# Patient Record
Sex: Male | Born: 1955 | Race: White | Hispanic: No | Smoking: Current every day smoker
Health system: Southern US, Community
[De-identification: ages and names within clinical notes are randomized; demographics above are authoritative.]

## PROBLEM LIST (undated history)

## (undated) DIAGNOSIS — G40909 Epilepsy, unspecified, not intractable, without status epilepticus: Secondary | ICD-10-CM

## (undated) DIAGNOSIS — L509 Urticaria, unspecified: Secondary | ICD-10-CM

## (undated) DIAGNOSIS — F32A Depression, unspecified: Secondary | ICD-10-CM

## (undated) DIAGNOSIS — I10 Essential (primary) hypertension: Secondary | ICD-10-CM

## (undated) DIAGNOSIS — K219 Gastro-esophageal reflux disease without esophagitis: Secondary | ICD-10-CM

## (undated) DIAGNOSIS — J45909 Unspecified asthma, uncomplicated: Secondary | ICD-10-CM

## (undated) DIAGNOSIS — F329 Major depressive disorder, single episode, unspecified: Secondary | ICD-10-CM

## (undated) HISTORY — DX: Essential (primary) hypertension: I10

## (undated) HISTORY — PX: TONSILLECTOMY: SUR1361

## (undated) HISTORY — DX: Epilepsy, unspecified, not intractable, without status epilepticus: G40.909

## (undated) HISTORY — PX: HERNIA REPAIR: SHX51

## (undated) HISTORY — DX: Major depressive disorder, single episode, unspecified: F32.9

## (undated) HISTORY — PX: EYE SURGERY: SHX253

## (undated) HISTORY — DX: Depression, unspecified: F32.A

## (undated) HISTORY — PX: KNEE SURGERY: SHX244

## (undated) HISTORY — PX: CYST REMOVAL NECK: SHX6281

## (undated) HISTORY — DX: Gastro-esophageal reflux disease without esophagitis: K21.9

## (undated) HISTORY — DX: Unspecified asthma, uncomplicated: J45.909

## (undated) HISTORY — DX: Urticaria, unspecified: L50.9

## (undated) HISTORY — PX: CYST REMOVAL TRUNK: SHX6283

---

## 2012-10-04 DIAGNOSIS — R569 Unspecified convulsions: Secondary | ICD-10-CM | POA: Insufficient documentation

## 2012-10-04 DIAGNOSIS — E782 Mixed hyperlipidemia: Secondary | ICD-10-CM | POA: Insufficient documentation

## 2012-10-04 DIAGNOSIS — IMO0001 Reserved for inherently not codable concepts without codable children: Secondary | ICD-10-CM | POA: Insufficient documentation

## 2012-10-04 DIAGNOSIS — M810 Age-related osteoporosis without current pathological fracture: Secondary | ICD-10-CM | POA: Insufficient documentation

## 2012-10-04 DIAGNOSIS — J45909 Unspecified asthma, uncomplicated: Secondary | ICD-10-CM | POA: Insufficient documentation

## 2012-10-04 DIAGNOSIS — M1990 Unspecified osteoarthritis, unspecified site: Secondary | ICD-10-CM | POA: Insufficient documentation

## 2014-08-20 DIAGNOSIS — I10 Essential (primary) hypertension: Secondary | ICD-10-CM | POA: Insufficient documentation

## 2014-08-20 DIAGNOSIS — F17210 Nicotine dependence, cigarettes, uncomplicated: Secondary | ICD-10-CM | POA: Insufficient documentation

## 2016-02-10 DIAGNOSIS — Z6831 Body mass index (BMI) 31.0-31.9, adult: Secondary | ICD-10-CM | POA: Diagnosis not present

## 2016-02-10 DIAGNOSIS — E669 Obesity, unspecified: Secondary | ICD-10-CM | POA: Diagnosis not present

## 2016-02-10 DIAGNOSIS — Z1389 Encounter for screening for other disorder: Secondary | ICD-10-CM | POA: Diagnosis not present

## 2016-02-10 DIAGNOSIS — Z0000 Encounter for general adult medical examination without abnormal findings: Secondary | ICD-10-CM | POA: Diagnosis not present

## 2016-02-11 DIAGNOSIS — E785 Hyperlipidemia, unspecified: Secondary | ICD-10-CM | POA: Diagnosis not present

## 2016-02-11 DIAGNOSIS — R52 Pain, unspecified: Secondary | ICD-10-CM | POA: Diagnosis not present

## 2016-02-11 DIAGNOSIS — Z79899 Other long term (current) drug therapy: Secondary | ICD-10-CM | POA: Diagnosis not present

## 2016-02-11 DIAGNOSIS — I10 Essential (primary) hypertension: Secondary | ICD-10-CM | POA: Diagnosis not present

## 2016-02-11 DIAGNOSIS — G4700 Insomnia, unspecified: Secondary | ICD-10-CM | POA: Diagnosis not present

## 2016-02-11 DIAGNOSIS — F419 Anxiety disorder, unspecified: Secondary | ICD-10-CM | POA: Diagnosis not present

## 2016-02-18 DIAGNOSIS — Z1389 Encounter for screening for other disorder: Secondary | ICD-10-CM | POA: Diagnosis not present

## 2016-02-18 DIAGNOSIS — J019 Acute sinusitis, unspecified: Secondary | ICD-10-CM | POA: Diagnosis not present

## 2016-02-18 DIAGNOSIS — E669 Obesity, unspecified: Secondary | ICD-10-CM | POA: Diagnosis not present

## 2016-02-18 DIAGNOSIS — Z6831 Body mass index (BMI) 31.0-31.9, adult: Secondary | ICD-10-CM | POA: Diagnosis not present

## 2016-02-23 DIAGNOSIS — E875 Hyperkalemia: Secondary | ICD-10-CM | POA: Diagnosis not present

## 2016-02-25 DIAGNOSIS — F419 Anxiety disorder, unspecified: Secondary | ICD-10-CM | POA: Diagnosis not present

## 2016-02-25 DIAGNOSIS — R52 Pain, unspecified: Secondary | ICD-10-CM | POA: Diagnosis not present

## 2016-02-25 DIAGNOSIS — I10 Essential (primary) hypertension: Secondary | ICD-10-CM | POA: Diagnosis not present

## 2016-02-25 DIAGNOSIS — G4700 Insomnia, unspecified: Secondary | ICD-10-CM | POA: Diagnosis not present

## 2016-02-25 DIAGNOSIS — E785 Hyperlipidemia, unspecified: Secondary | ICD-10-CM | POA: Diagnosis not present

## 2016-02-25 DIAGNOSIS — Z79899 Other long term (current) drug therapy: Secondary | ICD-10-CM | POA: Diagnosis not present

## 2016-03-01 DIAGNOSIS — Z6831 Body mass index (BMI) 31.0-31.9, adult: Secondary | ICD-10-CM | POA: Diagnosis not present

## 2016-03-01 DIAGNOSIS — E669 Obesity, unspecified: Secondary | ICD-10-CM | POA: Diagnosis not present

## 2016-03-01 DIAGNOSIS — L509 Urticaria, unspecified: Secondary | ICD-10-CM | POA: Diagnosis not present

## 2016-03-02 DIAGNOSIS — T7840XA Allergy, unspecified, initial encounter: Secondary | ICD-10-CM | POA: Diagnosis not present

## 2016-03-02 DIAGNOSIS — R21 Rash and other nonspecific skin eruption: Secondary | ICD-10-CM | POA: Diagnosis not present

## 2016-03-02 DIAGNOSIS — L500 Allergic urticaria: Secondary | ICD-10-CM | POA: Diagnosis not present

## 2016-03-08 DIAGNOSIS — L509 Urticaria, unspecified: Secondary | ICD-10-CM | POA: Diagnosis not present

## 2016-03-09 DIAGNOSIS — R0602 Shortness of breath: Secondary | ICD-10-CM | POA: Diagnosis not present

## 2016-03-09 DIAGNOSIS — L509 Urticaria, unspecified: Secondary | ICD-10-CM | POA: Diagnosis not present

## 2016-03-09 DIAGNOSIS — G40909 Epilepsy, unspecified, not intractable, without status epilepticus: Secondary | ICD-10-CM | POA: Diagnosis not present

## 2016-03-09 DIAGNOSIS — T7840XA Allergy, unspecified, initial encounter: Secondary | ICD-10-CM | POA: Diagnosis not present

## 2016-03-09 DIAGNOSIS — J449 Chronic obstructive pulmonary disease, unspecified: Secondary | ICD-10-CM | POA: Diagnosis not present

## 2016-03-09 DIAGNOSIS — Z7982 Long term (current) use of aspirin: Secondary | ICD-10-CM | POA: Diagnosis not present

## 2016-03-09 DIAGNOSIS — Z79899 Other long term (current) drug therapy: Secondary | ICD-10-CM | POA: Diagnosis not present

## 2016-03-09 DIAGNOSIS — I10 Essential (primary) hypertension: Secondary | ICD-10-CM | POA: Diagnosis not present

## 2016-03-09 DIAGNOSIS — Z87891 Personal history of nicotine dependence: Secondary | ICD-10-CM | POA: Diagnosis not present

## 2016-03-10 DIAGNOSIS — G4700 Insomnia, unspecified: Secondary | ICD-10-CM | POA: Diagnosis not present

## 2016-03-10 DIAGNOSIS — Z79899 Other long term (current) drug therapy: Secondary | ICD-10-CM | POA: Diagnosis not present

## 2016-03-10 DIAGNOSIS — R52 Pain, unspecified: Secondary | ICD-10-CM | POA: Diagnosis not present

## 2016-03-10 DIAGNOSIS — E785 Hyperlipidemia, unspecified: Secondary | ICD-10-CM | POA: Diagnosis not present

## 2016-03-10 DIAGNOSIS — F419 Anxiety disorder, unspecified: Secondary | ICD-10-CM | POA: Diagnosis not present

## 2016-03-10 DIAGNOSIS — I10 Essential (primary) hypertension: Secondary | ICD-10-CM | POA: Diagnosis not present

## 2016-03-14 DIAGNOSIS — Z6830 Body mass index (BMI) 30.0-30.9, adult: Secondary | ICD-10-CM | POA: Diagnosis not present

## 2016-03-14 DIAGNOSIS — E871 Hypo-osmolality and hyponatremia: Secondary | ICD-10-CM | POA: Diagnosis not present

## 2016-03-14 DIAGNOSIS — Z09 Encounter for follow-up examination after completed treatment for conditions other than malignant neoplasm: Secondary | ICD-10-CM | POA: Diagnosis not present

## 2016-03-14 DIAGNOSIS — L509 Urticaria, unspecified: Secondary | ICD-10-CM | POA: Diagnosis not present

## 2016-03-14 DIAGNOSIS — E669 Obesity, unspecified: Secondary | ICD-10-CM | POA: Diagnosis not present

## 2016-03-15 DIAGNOSIS — Z79899 Other long term (current) drug therapy: Secondary | ICD-10-CM | POA: Diagnosis not present

## 2016-03-15 DIAGNOSIS — E785 Hyperlipidemia, unspecified: Secondary | ICD-10-CM | POA: Diagnosis not present

## 2016-03-15 DIAGNOSIS — G4733 Obstructive sleep apnea (adult) (pediatric): Secondary | ICD-10-CM | POA: Diagnosis not present

## 2016-03-15 DIAGNOSIS — I10 Essential (primary) hypertension: Secondary | ICD-10-CM | POA: Diagnosis not present

## 2016-03-16 DIAGNOSIS — E875 Hyperkalemia: Secondary | ICD-10-CM | POA: Diagnosis not present

## 2016-03-21 DIAGNOSIS — Z79891 Long term (current) use of opiate analgesic: Secondary | ICD-10-CM | POA: Diagnosis not present

## 2016-03-21 DIAGNOSIS — G894 Chronic pain syndrome: Secondary | ICD-10-CM | POA: Diagnosis not present

## 2016-03-21 DIAGNOSIS — F1120 Opioid dependence, uncomplicated: Secondary | ICD-10-CM | POA: Diagnosis not present

## 2016-03-21 DIAGNOSIS — M5136 Other intervertebral disc degeneration, lumbar region: Secondary | ICD-10-CM | POA: Diagnosis not present

## 2016-03-23 DIAGNOSIS — G4700 Insomnia, unspecified: Secondary | ICD-10-CM | POA: Diagnosis not present

## 2016-03-23 DIAGNOSIS — Z79899 Other long term (current) drug therapy: Secondary | ICD-10-CM | POA: Diagnosis not present

## 2016-03-23 DIAGNOSIS — I10 Essential (primary) hypertension: Secondary | ICD-10-CM | POA: Diagnosis not present

## 2016-03-23 DIAGNOSIS — E785 Hyperlipidemia, unspecified: Secondary | ICD-10-CM | POA: Diagnosis not present

## 2016-03-23 DIAGNOSIS — F419 Anxiety disorder, unspecified: Secondary | ICD-10-CM | POA: Diagnosis not present

## 2016-03-23 DIAGNOSIS — R52 Pain, unspecified: Secondary | ICD-10-CM | POA: Diagnosis not present

## 2016-03-24 ENCOUNTER — Ambulatory Visit (INDEPENDENT_AMBULATORY_CARE_PROVIDER_SITE_OTHER): Payer: Medicare HMO | Admitting: Allergy and Immunology

## 2016-03-24 ENCOUNTER — Encounter: Payer: Self-pay | Admitting: Allergy and Immunology

## 2016-03-24 VITALS — BP 136/78 | HR 88 | Resp 14 | Ht 66.38 in | Wt 203.0 lb

## 2016-03-24 DIAGNOSIS — L500 Allergic urticaria: Secondary | ICD-10-CM

## 2016-03-24 DIAGNOSIS — J449 Chronic obstructive pulmonary disease, unspecified: Secondary | ICD-10-CM

## 2016-03-24 DIAGNOSIS — J3089 Other allergic rhinitis: Secondary | ICD-10-CM

## 2016-03-24 MED ORDER — FLUTICASONE-UMECLIDIN-VILANT 100-62.5-25 MCG/INH IN AEPB: 1.0000 | INHALATION_SPRAY | Freq: Every day | RESPIRATORY_TRACT | 5 refills | Status: DC

## 2016-03-24 MED ORDER — CETIRIZINE HCL 10 MG PO TABS: ORAL_TABLET | ORAL | 5 refills | Status: DC

## 2016-03-24 NOTE — Patient Instructions (Addendum)
  1. Allergen avoidance measures?  2. Every day use cetirizine 10 mg tablet 1 time per day. Can increase to twice a day if needed  3. Change Breo to Trelegy one inhalation 1 time per day  4. Continue montelukast 10 mg daily  5. Continue Flonase one-2 sprays each nostril daily  6. Continue Ventolin HFA if needed  7. Review results of all previous blood testing  8. Further evaluation and treatment?  9. Return to clinic in 4 weeks or earlier if problem

## 2016-03-24 NOTE — Progress Notes (Signed)
Dear Adrian Bird,  Thank you for referring Adrian Bird to the [facility] on 03/24/2016.   Below is a summation of this patient's evaluation and recommendations.  Thank you for your referral. I will keep you informed about this patient's response to treatment.   If you have any questions please do not hesitate to contact me.   Sincerely,  Adrian Priest, MD Allergy / Immunology  [facility]   ______________________________________________________________________    NEW PATIENT NOTE  Referring Provider: [name], PA Primary Provider: [name], PA Date of office visit: 03/24/2016    Subjective:   Chief Complaint:  Adrian Bird (DOB: Aug 15, 1955) is a 61 y.o. male who presents to the clinic on 03/24/2016 with a chief complaint of Urticaria .     HPI: Adrian Bird presents to this clinic in evaluation of a reaction that occurred approximately 3 weeks ago.  Apparently Adrian Bird developed red raised intensely itchy "water blisters" across his body approximately 3 weeks ago. He apparently was evaluated by his primary care doctor but his reaction progressed over 3 days and he ended up being admitted to [facility] for 1-1/2 days and treated with multiple medications with resolution of this issue within a week or so. However, last night he did have an episode involving his left arm that was red and itchy but without "water blisters" and this appeared to resolve over the course of the subsequent 12 hours.  He has no associated systemic or constitutional symptoms with this reaction. His lesions never healed with scar or hyperpigmentation. There is no obvious trigger giving rise to this issue. He has not had any new medications or new environmental change and has not apparently had an infectious disease of any type. He did use over-the-counter Allegra for 4 days prior  to this reaction because his prescription for Allegra ran out. He did have blood tests checked at [facility] and at [facility] although the results of those blood tests are not available for review at this point in time.  Adrian Bird does have a history of respiratory tract inflammation. He apparently has a history of COPD having smoked from the age of 38 up until February 2017. He now uses a Teaching laboratory technician. He's been placed on Breo which does help him regarding his wheezing and coughing and shortness of breath. He will use a short acting bronchodilator once or twice a day whenever he exerts himself to any large degree. He also has itchy nose and sneezing and watery eyes that appears to occur following exposure to dust for which he uses Flonase which does help the symptoms. He apparently was skin tested many years ago and was apparently given immunotherapy for a year or so up in [place].  Past Medical History:  Diagnosis Date  . Acid reflux   . Asthma   . Depression   . Epilepsy (HCC)   . High blood pressure   . Urticaria     Past Surgical History:  Procedure Laterality Date  . CYST REMOVAL NECK    . CYST REMOVAL TRUNK    . EYE SURGERY Bilateral   . HERNIA REPAIR    . KNEE SURGERY Left   . TONSILLECTOMY      Allergies as of 03/24/2016      Reactions   Cephalexin Hives   Doxycycline Hives   Sulfa Antibiotics Hives      Medication  List      aspirin EC 81 MG tablet 81 mg.   atorvastatin 40 MG tablet Commonly known as:  LIPITOR   BREO ELLIPTA 100-25 MCG/INH Aepb Generic drug:  fluticasone furoate-vilanterol   carbamazepine 200 MG tablet Commonly known as:  TEGRETOL   esomeprazole 20 MG capsule Commonly known as:  NEXIUM Take 20 mg by mouth daily.   ezetimibe 10 MG tablet Commonly known as:  ZETIA Take 10 mg by mouth at bedtime.   fexofenadine 180 MG tablet Commonly known as:  ALLEGRA Take 180 mg by mouth.   FLUoxetine 20 MG capsule Commonly  known as:  PROZAC   HYDROcodone-acetaminophen 10-325 MG tablet Commonly known as:  NORCO   LINZESS 290 MCG Caps capsule Generic drug:  linaclotide   lisinopril 20 MG tablet Commonly known as:  PRINIVIL,ZESTRIL Take 20 mg by mouth daily.   meloxicam 7.5 MG tablet Commonly known as:  MOBIC Take 7.5 mg by mouth 2 (two) times daily.   mirtazapine 30 MG tablet Commonly known as:  REMERON 30 mg.   montelukast 10 MG tablet Commonly known as:  SINGULAIR   ranitidine 300 MG capsule Commonly known as:  ZANTAC   tiZANidine 4 MG tablet Commonly known as:  ZANAFLEX Take 4 mg by mouth.   VENTOLIN HFA 108 (90 Base) MCG/ACT inhaler Generic drug:  albuterol   VOLTAREN 1 % Gel Generic drug:  diclofenac sodium       Review of systems negative except as noted in HPI / PMHx or noted below:  Review of Systems  Constitutional: Negative.   HENT: Negative.   Eyes: Negative.   Respiratory: Negative.   Cardiovascular: Negative.   Gastrointestinal: Negative.   Genitourinary: Negative.   Musculoskeletal: Negative.   Skin: Negative.   Neurological: Negative.   Endo/Heme/Allergies: Negative.   Psychiatric/Behavioral: Negative.     Family History  Problem Relation Age of Onset  . Congestive Heart Failure Mother   . Lung cancer Father   . Diabetes Sister   . Diabetes Brother   . Diabetes Maternal Aunt   . Lung cancer Maternal Grandfather     Social History   Social History  . Marital status: Married    Spouse name: N/A  . Number of children: N/A  . Years of education: N/A   Occupational History  . Not on file.   Social History Main Topics  . Smoking status: Current Every Day Smoker    Types: E-cigarettes  . Smokeless tobacco: Never Used  . Alcohol use No  . Drug use: No  . Sexual activity: Not on file   Other Topics Concern  . Not on file   Social History Narrative  . No narrative on file    Environmental and Social history  Lives in a apartment with a dry  environment, no animals located inside the household, no carpeting in the bedroom, no plastic on the bed or pillow, and no smoking ongoing with inside the household.  Objective:   Vitals:   03/24/16 1420  BP: 136/78  Pulse: 88  Resp: 14   Height: 5' 6.38" (168.6 cm) Weight: 203 lb (92.1 kg)  Physical Exam  Constitutional: He is well-developed, well-nourished, and in no distress.  HENT:  Head: Normocephalic. Head is without right periorbital erythema and without left periorbital erythema.  Right Ear: Tympanic membrane, external ear and ear canal normal.  Left Ear: Tympanic membrane, external ear and ear canal normal.  Nose: Nose normal. No mucosal edema or rhinorrhea.  Mouth/Throat: Oropharynx is clear and moist and mucous membranes are normal. No oropharyngeal exudate.  Eyes: Conjunctivae and lids are normal. Pupils are equal, round, and reactive to light.  Neck: Trachea normal. No tracheal deviation present. No thyromegaly present.  Cardiovascular: Normal rate, regular rhythm, S1 normal, S2 normal and normal heart sounds.   No murmur heard. Pulmonary/Chest: Effort normal. No stridor. No tachypnea. No respiratory distress. He has no wheezes. He has no rales. He exhibits no tenderness.  Abdominal: Soft. He exhibits no distension and no mass. There is no hepatosplenomegaly. There is no tenderness. There is no rebound and no guarding.  Musculoskeletal: He exhibits no edema or tenderness.  Lymphadenopathy:       Head (right side): No tonsillar adenopathy present.       Head (left side): No tonsillar adenopathy present.    He has no cervical adenopathy.    He has no axillary adenopathy.  Neurological: He is alert. Gait normal.  Skin: No rash noted. He is not diaphoretic. No erythema. No pallor. Nails show no clubbing.  Psychiatric: Mood and affect normal.    Diagnostics: Allergy skin tests were performed. He did not demonstrate any hypersensitivity against a screening panel of foods  or aeroallergens.  Spirometry was performed and demonstrated an FEV1 of 2.32 @ 73 % of predicted.  Results of blood tests dated 03/09/2016 from [facility] identifies normal hepatic and renal function, a sodium of 128, a glucose of 116, white blood cell count of 12.4 with a neutrophilia, hemoglobin 14.9, platelet 297   Assessment and Plan:    1. Allergic urticaria   2. COPD with asthma (HCC)   3. Other allergic rhinitis     1. Allergen avoidance measures?  2. Every day use cetirizine 10 mg tablet 1 time per day. Can increase to twice a day if needed  3. Change Breo to Trelegy one inhalation 1 time per day  4. Continue montelukast 10 mg daily  5. Continue Flonase one-2 sprays each nostril daily  6. Continue Ventolin HFA if needed  7. Review results of all previous blood testing  8. Further evaluation and treatment?  9. Return to clinic in 4 weeks or earlier if problem  Is not very obvious why [facility] had an episode of immunological hyperreactivity over the course of the past several weeks but at this point in time, because he is doing so much better, I am not going to have him undergo any further evaluation and treatment unless this is recurrent. He has the option of using cetirizine once or twice if needed. For his respiratory tract issue I'm going to have him use a triple combination of inhalers with a anticholinergic agent added into his long-acting bronchodilator and inhaled steroid while he continues to use other anti-inflammatory agents for his upper airway. I will review the results of all blood tests that had been collected in the past and over the course of the next 4 weeks we'll make a determination about whether or not he will require further evaluation and treatment for his immunological hyperreactivity.  Adrian Priest, MD [facility]

## 2016-03-30 DIAGNOSIS — E875 Hyperkalemia: Secondary | ICD-10-CM | POA: Diagnosis not present

## 2016-04-07 ENCOUNTER — Other Ambulatory Visit: Payer: Self-pay

## 2016-04-07 ENCOUNTER — Ambulatory Visit (INDEPENDENT_AMBULATORY_CARE_PROVIDER_SITE_OTHER): Payer: Medicare HMO | Admitting: Allergy and Immunology

## 2016-04-07 ENCOUNTER — Encounter: Payer: Self-pay | Admitting: Allergy and Immunology

## 2016-04-07 VITALS — BP 122/80 | HR 64 | Resp 14

## 2016-04-07 DIAGNOSIS — J449 Chronic obstructive pulmonary disease, unspecified: Secondary | ICD-10-CM | POA: Diagnosis not present

## 2016-04-07 DIAGNOSIS — L500 Allergic urticaria: Secondary | ICD-10-CM | POA: Diagnosis not present

## 2016-04-07 DIAGNOSIS — J3089 Other allergic rhinitis: Secondary | ICD-10-CM

## 2016-04-07 MED ORDER — FLUTICASONE-UMECLIDIN-VILANT 100-62.5-25 MCG/INH IN AEPB: 1.0000 | INHALATION_SPRAY | Freq: Every day | RESPIRATORY_TRACT | 5 refills | Status: DC

## 2016-04-07 NOTE — Progress Notes (Signed)
Follow-up Note  Referring Provider: Helayne SeminoleNelson, Brittany, PA Primary Provider: Helayne SeminoleBrittany Nelson, PA Date of Office Visit: 04/07/2016  Subjective:   Adrian Bird (DOB: [birth year]) is a 61 y.o. male who [facility] on 04/07/2016 in re-evaluation of the following:  HPI: Kathlene NovemberMike returns to this clinic in reevaluation of his urticaria and COPD with asthma and allergic rhinitis. I last saw him in this clinic on 03/24/2016 at which time we established a plan to address each issue.  He is doing wonderful. He has not had any more skin problems. He does not have any issues with his nose. He believes that his breathing may be better while using his current inhaler. He still uses a short acting bronchodilator at least once a day.  Allergies as of 04/07/2016      Reactions   Cephalexin Hives   Doxycycline Hives   Sulfa Antibiotics Hives      Medication List      aspirin EC 81 MG tablet 81 mg.   atorvastatin 40 MG tablet Commonly known as:  LIPITOR   carbamazepine 200 MG tablet Commonly known as:  TEGRETOL   cetirizine 10 MG tablet Commonly known as:  ZYRTEC Take one tablet by mouth once daily.  Can use two times daily if needed.   esomeprazole 20 MG capsule Commonly known as:  NEXIUM Take 20 mg by mouth daily.   ezetimibe 10 MG tablet Commonly known as:  ZETIA Take 10 mg by mouth at bedtime.   fexofenadine 180 MG tablet Commonly known as:  ALLEGRA Take 180 mg by mouth.   FLUoxetine 20 MG capsule Commonly known as:  PROZAC   Fluticasone-Umeclidin-Vilant 100-62.5-25 MCG/INH Aepb Commonly known as:  TRELEGY ELLIPTA Inhale 1 Dose into the lungs daily. Rinse, gargle, and spit after use.   HYDROcodone-acetaminophen 10-325 MG tablet Commonly known as:  NORCO   LINZESS 290 MCG Caps capsule Generic drug:  linaclotide   lisinopril 20 MG tablet Commonly known as:  PRINIVIL,ZESTRIL Take 20 mg by mouth daily.   meloxicam 7.5 MG  tablet Commonly known as:  MOBIC Take 7.5 mg by mouth 2 (two) times daily.   mirtazapine 30 MG tablet Commonly known as:  REMERON 30 mg.   montelukast 10 MG tablet Commonly known as:  SINGULAIR   ranitidine 300 MG capsule Commonly known as:  ZANTAC   tiZANidine 4 MG tablet Commonly known as:  ZANAFLEX Take 4 mg by mouth.   VENTOLIN HFA 108 (90 Base) MCG/ACT inhaler Generic drug:  albuterol   VOLTAREN 1 % Gel Generic drug:  diclofenac sodium       Past Medical History:  Diagnosis Date  . Acid reflux   . Asthma   . Depression   . Epilepsy (HCC)   . High blood pressure   . Urticaria     Past Surgical History:  Procedure Laterality Date  . CYST REMOVAL NECK    . CYST REMOVAL TRUNK    . EYE SURGERY Bilateral   . HERNIA REPAIR    . KNEE SURGERY Left   . TONSILLECTOMY      Review of systems negative except as noted in HPI / PMHx or noted below:  Review of Systems  Constitutional: Negative.   HENT: Negative.   Eyes: Negative.   Respiratory: Negative.   Cardiovascular: Negative.   Gastrointestinal: Negative.   Genitourinary: Negative.   Musculoskeletal: Negative.   Skin: Negative.   Neurological: Negative.   Endo/Heme/Allergies: Negative.  Psychiatric/Behavioral: Negative.      Objective:   Vitals:   04/07/16 1116  BP: 122/80  Pulse: 64  Resp: 14          Physical Exam  Constitutional: He is well-developed, well-nourished, and in no distress.  HENT:  Head: Normocephalic.  Right Ear: Tympanic membrane, external ear and ear canal normal.  Left Ear: Tympanic membrane, external ear and ear canal normal.  Nose: Nose normal. No mucosal edema or rhinorrhea.  Mouth/Throat: Uvula is midline, oropharynx is clear and moist and mucous membranes are normal. No oropharyngeal exudate.  Eyes: Conjunctivae are normal.  Neck: Trachea normal. No tracheal tenderness present. No tracheal deviation present. No thyromegaly present.  Cardiovascular: Normal rate,  regular rhythm, S1 normal, S2 normal and normal heart sounds.   No murmur heard. Pulmonary/Chest: Breath sounds normal. No stridor. No respiratory distress. He has no wheezes. He has no rales.  Musculoskeletal: He exhibits no edema.  Lymphadenopathy:       Head (right side): No tonsillar adenopathy present.       Head (left side): No tonsillar adenopathy present.    He has no cervical adenopathy.  Neurological: He is alert. Gait normal.  Skin: No rash noted. He is not diaphoretic. No erythema. Nails show no clubbing.  Psychiatric: Mood and affect normal.    Diagnostics:    Spirometry was performed and demonstrated an FEV1 of 2.58 at 81 % of predicted.  The patient had an Asthma Control Test with the following results: ACT Total Score: 21.    Assessment and Plan:   1. Allergic urticaria   2. COPD with asthma (HCC)   3. Other allergic rhinitis     1. Every day use cetirizine 10 mg tablet 1 time per day. Can increase to twice a day if needed  2. Continue Trelegy one inhalation 1 time per day  3. Continue montelukast 10 mg daily  4. Continue Flonase one-2 sprays each nostril daily  5. Continue Ventolin HFA if needed  6. Return to clinic in 12 weeks or earlier if problem  [name] appears to be doing very well and I asked him to contact me should he develop recurrent problems with his skin or respiratory tract as he moves forward. I would like to see him back in this clinic in 12 weeks or earlier if there is a problem while he continues to use anti-inflammatory medications for his respiratory tract and consistent use of an antihistamine.  Laurette Schimke, MD Allergy / Immunology [facility]

## 2016-04-07 NOTE — Patient Instructions (Addendum)
  1. Every day use cetirizine 10 mg tablet 1 time per day. Can increase to twice a day if needed  2. Continue Trelegy one inhalation 1 time per day  3. Continue montelukast 10 mg daily  4. Continue Flonase one-2 sprays each nostril daily  5. Continue Ventolin HFA if needed  6. Return to clinic in 12 weeks or earlier if problem

## 2016-04-08 DIAGNOSIS — R569 Unspecified convulsions: Secondary | ICD-10-CM | POA: Diagnosis not present

## 2016-04-08 DIAGNOSIS — Z79899 Other long term (current) drug therapy: Secondary | ICD-10-CM | POA: Diagnosis not present

## 2016-04-11 DIAGNOSIS — Z6831 Body mass index (BMI) 31.0-31.9, adult: Secondary | ICD-10-CM | POA: Diagnosis not present

## 2016-04-11 DIAGNOSIS — E669 Obesity, unspecified: Secondary | ICD-10-CM | POA: Diagnosis not present

## 2016-04-11 DIAGNOSIS — R21 Rash and other nonspecific skin eruption: Secondary | ICD-10-CM | POA: Diagnosis not present

## 2016-04-12 DIAGNOSIS — Z79899 Other long term (current) drug therapy: Secondary | ICD-10-CM | POA: Diagnosis not present

## 2016-04-13 DIAGNOSIS — G4700 Insomnia, unspecified: Secondary | ICD-10-CM | POA: Diagnosis not present

## 2016-04-13 DIAGNOSIS — R52 Pain, unspecified: Secondary | ICD-10-CM | POA: Diagnosis not present

## 2016-04-13 DIAGNOSIS — I10 Essential (primary) hypertension: Secondary | ICD-10-CM | POA: Diagnosis not present

## 2016-04-13 DIAGNOSIS — Z79899 Other long term (current) drug therapy: Secondary | ICD-10-CM | POA: Diagnosis not present

## 2016-04-13 DIAGNOSIS — F419 Anxiety disorder, unspecified: Secondary | ICD-10-CM | POA: Diagnosis not present

## 2016-04-13 DIAGNOSIS — E785 Hyperlipidemia, unspecified: Secondary | ICD-10-CM | POA: Diagnosis not present

## 2016-04-14 DIAGNOSIS — F419 Anxiety disorder, unspecified: Secondary | ICD-10-CM | POA: Diagnosis not present

## 2016-04-14 DIAGNOSIS — G4700 Insomnia, unspecified: Secondary | ICD-10-CM | POA: Diagnosis not present

## 2016-04-14 DIAGNOSIS — G8929 Other chronic pain: Secondary | ICD-10-CM | POA: Diagnosis not present

## 2016-04-14 DIAGNOSIS — I10 Essential (primary) hypertension: Secondary | ICD-10-CM | POA: Diagnosis not present

## 2016-04-14 DIAGNOSIS — E785 Hyperlipidemia, unspecified: Secondary | ICD-10-CM | POA: Diagnosis not present

## 2016-04-14 DIAGNOSIS — Z789 Other specified health status: Secondary | ICD-10-CM | POA: Diagnosis not present

## 2016-04-19 DIAGNOSIS — G894 Chronic pain syndrome: Secondary | ICD-10-CM | POA: Diagnosis not present

## 2016-04-19 DIAGNOSIS — M5136 Other intervertebral disc degeneration, lumbar region: Secondary | ICD-10-CM | POA: Diagnosis not present

## 2016-04-19 DIAGNOSIS — Z79891 Long term (current) use of opiate analgesic: Secondary | ICD-10-CM | POA: Diagnosis not present

## 2016-04-19 DIAGNOSIS — M47816 Spondylosis without myelopathy or radiculopathy, lumbar region: Secondary | ICD-10-CM | POA: Diagnosis not present

## 2016-04-19 DIAGNOSIS — F1120 Opioid dependence, uncomplicated: Secondary | ICD-10-CM | POA: Diagnosis not present

## 2016-04-19 DIAGNOSIS — H52222 Regular astigmatism, left eye: Secondary | ICD-10-CM | POA: Diagnosis not present

## 2016-04-27 DIAGNOSIS — G4700 Insomnia, unspecified: Secondary | ICD-10-CM | POA: Diagnosis not present

## 2016-04-27 DIAGNOSIS — R52 Pain, unspecified: Secondary | ICD-10-CM | POA: Diagnosis not present

## 2016-04-27 DIAGNOSIS — E785 Hyperlipidemia, unspecified: Secondary | ICD-10-CM | POA: Diagnosis not present

## 2016-04-27 DIAGNOSIS — F419 Anxiety disorder, unspecified: Secondary | ICD-10-CM | POA: Diagnosis not present

## 2016-04-27 DIAGNOSIS — I10 Essential (primary) hypertension: Secondary | ICD-10-CM | POA: Diagnosis not present

## 2016-04-27 DIAGNOSIS — Z79899 Other long term (current) drug therapy: Secondary | ICD-10-CM | POA: Diagnosis not present

## 2016-05-06 DIAGNOSIS — I160 Hypertensive urgency: Secondary | ICD-10-CM | POA: Diagnosis not present

## 2016-05-06 DIAGNOSIS — G40909 Epilepsy, unspecified, not intractable, without status epilepticus: Secondary | ICD-10-CM | POA: Diagnosis not present

## 2016-05-06 DIAGNOSIS — R51 Headache: Secondary | ICD-10-CM | POA: Diagnosis not present

## 2016-05-06 DIAGNOSIS — E871 Hypo-osmolality and hyponatremia: Secondary | ICD-10-CM | POA: Diagnosis not present

## 2016-05-06 DIAGNOSIS — R112 Nausea with vomiting, unspecified: Secondary | ICD-10-CM | POA: Diagnosis not present

## 2016-05-06 DIAGNOSIS — J449 Chronic obstructive pulmonary disease, unspecified: Secondary | ICD-10-CM | POA: Diagnosis not present

## 2016-05-06 DIAGNOSIS — R0602 Shortness of breath: Secondary | ICD-10-CM | POA: Diagnosis not present

## 2016-05-06 DIAGNOSIS — J0130 Acute sphenoidal sinusitis, unspecified: Secondary | ICD-10-CM | POA: Diagnosis not present

## 2016-05-06 DIAGNOSIS — R21 Rash and other nonspecific skin eruption: Secondary | ICD-10-CM | POA: Diagnosis not present

## 2016-05-06 DIAGNOSIS — J019 Acute sinusitis, unspecified: Secondary | ICD-10-CM | POA: Diagnosis not present

## 2016-05-06 DIAGNOSIS — I10 Essential (primary) hypertension: Secondary | ICD-10-CM | POA: Diagnosis not present

## 2016-05-06 DIAGNOSIS — R079 Chest pain, unspecified: Secondary | ICD-10-CM | POA: Diagnosis not present

## 2016-05-06 DIAGNOSIS — B349 Viral infection, unspecified: Secondary | ICD-10-CM | POA: Diagnosis not present

## 2016-05-07 DIAGNOSIS — E871 Hypo-osmolality and hyponatremia: Secondary | ICD-10-CM | POA: Diagnosis not present

## 2016-05-07 DIAGNOSIS — R51 Headache: Secondary | ICD-10-CM | POA: Diagnosis not present

## 2016-05-07 DIAGNOSIS — I160 Hypertensive urgency: Secondary | ICD-10-CM | POA: Diagnosis not present

## 2016-05-07 DIAGNOSIS — J019 Acute sinusitis, unspecified: Secondary | ICD-10-CM | POA: Diagnosis not present

## 2016-05-07 DIAGNOSIS — R112 Nausea with vomiting, unspecified: Secondary | ICD-10-CM | POA: Diagnosis not present

## 2016-05-07 DIAGNOSIS — G40909 Epilepsy, unspecified, not intractable, without status epilepticus: Secondary | ICD-10-CM | POA: Diagnosis not present

## 2016-05-18 DIAGNOSIS — R52 Pain, unspecified: Secondary | ICD-10-CM | POA: Diagnosis not present

## 2016-05-18 DIAGNOSIS — E785 Hyperlipidemia, unspecified: Secondary | ICD-10-CM | POA: Diagnosis not present

## 2016-05-18 DIAGNOSIS — Z79899 Other long term (current) drug therapy: Secondary | ICD-10-CM | POA: Diagnosis not present

## 2016-05-18 DIAGNOSIS — F419 Anxiety disorder, unspecified: Secondary | ICD-10-CM | POA: Diagnosis not present

## 2016-05-18 DIAGNOSIS — G4700 Insomnia, unspecified: Secondary | ICD-10-CM | POA: Diagnosis not present

## 2016-05-18 DIAGNOSIS — I10 Essential (primary) hypertension: Secondary | ICD-10-CM | POA: Diagnosis not present

## 2016-05-19 DIAGNOSIS — I10 Essential (primary) hypertension: Secondary | ICD-10-CM | POA: Diagnosis not present

## 2016-05-19 DIAGNOSIS — Z79899 Other long term (current) drug therapy: Secondary | ICD-10-CM | POA: Diagnosis not present

## 2016-05-19 DIAGNOSIS — E871 Hypo-osmolality and hyponatremia: Secondary | ICD-10-CM | POA: Diagnosis not present

## 2016-05-19 DIAGNOSIS — Z09 Encounter for follow-up examination after completed treatment for conditions other than malignant neoplasm: Secondary | ICD-10-CM | POA: Diagnosis not present

## 2016-05-19 DIAGNOSIS — Z6831 Body mass index (BMI) 31.0-31.9, adult: Secondary | ICD-10-CM | POA: Diagnosis not present

## 2016-05-23 DIAGNOSIS — F1120 Opioid dependence, uncomplicated: Secondary | ICD-10-CM | POA: Diagnosis not present

## 2016-05-23 DIAGNOSIS — Z79891 Long term (current) use of opiate analgesic: Secondary | ICD-10-CM | POA: Diagnosis not present

## 2016-05-23 DIAGNOSIS — M5136 Other intervertebral disc degeneration, lumbar region: Secondary | ICD-10-CM | POA: Diagnosis not present

## 2016-05-23 DIAGNOSIS — G894 Chronic pain syndrome: Secondary | ICD-10-CM | POA: Diagnosis not present

## 2016-05-23 DIAGNOSIS — M47816 Spondylosis without myelopathy or radiculopathy, lumbar region: Secondary | ICD-10-CM | POA: Diagnosis not present

## 2016-05-27 ENCOUNTER — Encounter: Payer: Self-pay | Admitting: Allergy and Immunology

## 2016-06-01 DIAGNOSIS — R52 Pain, unspecified: Secondary | ICD-10-CM | POA: Diagnosis not present

## 2016-06-01 DIAGNOSIS — I10 Essential (primary) hypertension: Secondary | ICD-10-CM | POA: Diagnosis not present

## 2016-06-01 DIAGNOSIS — G4700 Insomnia, unspecified: Secondary | ICD-10-CM | POA: Diagnosis not present

## 2016-06-01 DIAGNOSIS — F419 Anxiety disorder, unspecified: Secondary | ICD-10-CM | POA: Diagnosis not present

## 2016-06-01 DIAGNOSIS — E785 Hyperlipidemia, unspecified: Secondary | ICD-10-CM | POA: Diagnosis not present

## 2016-06-01 DIAGNOSIS — Z79899 Other long term (current) drug therapy: Secondary | ICD-10-CM | POA: Diagnosis not present

## 2016-06-07 DIAGNOSIS — L509 Urticaria, unspecified: Secondary | ICD-10-CM | POA: Diagnosis not present

## 2016-06-13 DIAGNOSIS — G4733 Obstructive sleep apnea (adult) (pediatric): Secondary | ICD-10-CM | POA: Diagnosis not present

## 2016-06-15 DIAGNOSIS — I10 Essential (primary) hypertension: Secondary | ICD-10-CM | POA: Diagnosis not present

## 2016-06-15 DIAGNOSIS — G4700 Insomnia, unspecified: Secondary | ICD-10-CM | POA: Diagnosis not present

## 2016-06-15 DIAGNOSIS — E785 Hyperlipidemia, unspecified: Secondary | ICD-10-CM | POA: Diagnosis not present

## 2016-06-15 DIAGNOSIS — Z79899 Other long term (current) drug therapy: Secondary | ICD-10-CM | POA: Diagnosis not present

## 2016-06-15 DIAGNOSIS — F419 Anxiety disorder, unspecified: Secondary | ICD-10-CM | POA: Diagnosis not present

## 2016-06-15 DIAGNOSIS — R52 Pain, unspecified: Secondary | ICD-10-CM | POA: Diagnosis not present

## 2016-06-20 DIAGNOSIS — Z79891 Long term (current) use of opiate analgesic: Secondary | ICD-10-CM | POA: Diagnosis not present

## 2016-06-20 DIAGNOSIS — M5136 Other intervertebral disc degeneration, lumbar region: Secondary | ICD-10-CM | POA: Diagnosis not present

## 2016-06-20 DIAGNOSIS — F1120 Opioid dependence, uncomplicated: Secondary | ICD-10-CM | POA: Diagnosis not present

## 2016-06-20 DIAGNOSIS — G894 Chronic pain syndrome: Secondary | ICD-10-CM | POA: Diagnosis not present

## 2016-06-20 DIAGNOSIS — M47816 Spondylosis without myelopathy or radiculopathy, lumbar region: Secondary | ICD-10-CM | POA: Diagnosis not present

## 2016-06-22 DIAGNOSIS — I10 Essential (primary) hypertension: Secondary | ICD-10-CM | POA: Diagnosis not present

## 2016-06-22 DIAGNOSIS — E785 Hyperlipidemia, unspecified: Secondary | ICD-10-CM | POA: Diagnosis not present

## 2016-06-22 DIAGNOSIS — Z789 Other specified health status: Secondary | ICD-10-CM | POA: Diagnosis not present

## 2016-06-22 DIAGNOSIS — G4700 Insomnia, unspecified: Secondary | ICD-10-CM | POA: Diagnosis not present

## 2016-06-22 DIAGNOSIS — F419 Anxiety disorder, unspecified: Secondary | ICD-10-CM | POA: Diagnosis not present

## 2016-06-29 DIAGNOSIS — E785 Hyperlipidemia, unspecified: Secondary | ICD-10-CM | POA: Diagnosis not present

## 2016-06-29 DIAGNOSIS — G4700 Insomnia, unspecified: Secondary | ICD-10-CM | POA: Diagnosis not present

## 2016-06-29 DIAGNOSIS — Z79899 Other long term (current) drug therapy: Secondary | ICD-10-CM | POA: Diagnosis not present

## 2016-06-29 DIAGNOSIS — E669 Obesity, unspecified: Secondary | ICD-10-CM | POA: Diagnosis not present

## 2016-06-29 DIAGNOSIS — Z6831 Body mass index (BMI) 31.0-31.9, adult: Secondary | ICD-10-CM | POA: Diagnosis not present

## 2016-06-29 DIAGNOSIS — R52 Pain, unspecified: Secondary | ICD-10-CM | POA: Diagnosis not present

## 2016-06-29 DIAGNOSIS — F419 Anxiety disorder, unspecified: Secondary | ICD-10-CM | POA: Diagnosis not present

## 2016-06-29 DIAGNOSIS — I10 Essential (primary) hypertension: Secondary | ICD-10-CM | POA: Diagnosis not present

## 2016-07-08 ENCOUNTER — Ambulatory Visit: Payer: Medicare HMO | Admitting: Allergy and Immunology

## 2016-07-11 ENCOUNTER — Ambulatory Visit (INDEPENDENT_AMBULATORY_CARE_PROVIDER_SITE_OTHER): Payer: Medicare HMO | Admitting: Allergy and Immunology

## 2016-07-11 ENCOUNTER — Encounter: Payer: Self-pay | Admitting: Allergy and Immunology

## 2016-07-11 VITALS — BP 134/84 | HR 92 | Resp 20

## 2016-07-11 DIAGNOSIS — L500 Allergic urticaria: Secondary | ICD-10-CM | POA: Diagnosis not present

## 2016-07-11 DIAGNOSIS — J3089 Other allergic rhinitis: Secondary | ICD-10-CM | POA: Diagnosis not present

## 2016-07-11 DIAGNOSIS — J449 Chronic obstructive pulmonary disease, unspecified: Secondary | ICD-10-CM | POA: Diagnosis not present

## 2016-07-11 NOTE — Patient Instructions (Addendum)
  1. Every day use cetirizine 10 mg tablet 1 time per day. Can increase to twice a day if needed  2. Continue Trelegy one inhalation 1 time per day  3. Continue montelukast 10 mg daily  4. Continue Flonase 1-2 sprays each nostril daily  5. Continue Ventolin HFA if needed    6. Return to clinic in 6 months or earlier if problem

## 2016-07-11 NOTE — Progress Notes (Signed)
Follow-up Note    Referring Provider: Helayne SeminoleNelson, Brittany, PA Primary Provider: Dema SeverinYork, Regina F, NP Date of Office Visit: 07/11/2016  Subjective:   Adrian Bird (DOB: 05/17/1955) is a 61 y.o. male who [facility] on 07/11/2016 in re-evaluation of the following:  HPI: Adrian NovemberMike presents to this clinic in evaluation of his urticaria and COPD with a component of asthma and allergic rhinitis. I have not seen him in this clinic since March 2018  Overall he thinks his breathing is really going quite well. He likes the effect that he gets from using his combination inhaler but he still uses a short acting rescue inhaler every morning. Rarely does he use it in a rescue mode. He believes that he does better throughout the entire day if he uses his combination inhaler and a short acting bronchodilator together every morning.  He's had very little issue with his nose while using montelukast and Flonase. He has not required a systemic steroid or antibiotic to treat any type of respiratory tract issue.  While using cetirizine every day he has not had any urticaria. However, he did recently get admitted to the hospital for systemic arterial hypertension and also apparently was treated with an antibiotic at that point in time. Within 30 minutes of being administered an injection of antibiotic he developed diffuse urticaria and he has had a lingering dermatitis ever since that event. He is about 2-3 weeks out from that event. His dermatitis is not itchy and it is slowly improving.  Allergies as of 07/11/2016      Reactions   Cephalexin Hives   Doxycycline Hives   Sulfa Antibiotics Hives      Medication List      aspirin EC 81 MG tablet 81 mg.   atorvastatin 40 MG tablet Commonly known as:  LIPITOR   carbamazepine 200 MG tablet Commonly known as:  TEGRETOL   cetirizine 10 MG tablet Commonly known as:  ZYRTEC Take one tablet by mouth once daily.  Can use two  times daily if needed.   esomeprazole 20 MG capsule Commonly known as:  NEXIUM Take 20 mg by mouth daily.   ezetimibe 10 MG tablet Commonly known as:  ZETIA Take 10 mg by mouth at bedtime.   fexofenadine 180 MG tablet Commonly known as:  ALLEGRA Take 180 mg by mouth.   FLUoxetine 20 MG capsule Commonly known as:  PROZAC   Fluticasone-Umeclidin-Vilant 100-62.5-25 MCG/INH Aepb Commonly known as:  TRELEGY ELLIPTA Inhale 1 Dose into the lungs daily. Rinse, gargle, and spit after use.   HYDROcodone-acetaminophen 10-325 MG tablet Commonly known as:  NORCO   LINZESS 290 MCG Caps capsule Generic drug:  linaclotide   lisinopril 20 MG tablet Commonly known as:  PRINIVIL,ZESTRIL Take 20 mg by mouth daily.   meloxicam 7.5 MG tablet Commonly known as:  MOBIC Take 7.5 mg by mouth 2 (two) times daily.   mirtazapine 30 MG tablet Commonly known as:  REMERON 30 mg.   montelukast 10 MG tablet Commonly known as:  SINGULAIR   ranitidine 300 MG capsule Commonly known as:  ZANTAC   tiZANidine 4 MG tablet Commonly known as:  ZANAFLEX Take 4 mg by mouth.   VENTOLIN HFA 108 (90 Base) MCG/ACT inhaler Generic drug:  albuterol   VOLTAREN 1 % Gel Generic drug:  diclofenac sodium       Past Medical History:  Diagnosis Date  . Acid reflux   . Asthma   .  Depression   . Epilepsy (HCC)   . High blood pressure   . Urticaria     Past Surgical History:  Procedure Laterality Date  . CYST REMOVAL NECK    . CYST REMOVAL TRUNK    . EYE SURGERY Bilateral   . HERNIA REPAIR    . KNEE SURGERY Left   . TONSILLECTOMY      Review of systems negative except as noted in HPI / PMHx or noted below:  Review of Systems  Constitutional: Negative.   HENT: Negative.   Eyes: Negative.   Respiratory: Negative.   Cardiovascular: Negative.   Gastrointestinal: Negative.   Genitourinary: Negative.   Musculoskeletal: Negative.   Skin: Negative.   Neurological: Negative.     Endo/Heme/Allergies: Negative.   Psychiatric/Behavioral: Negative.      Objective:   Vitals:   07/11/16 1105  BP: 134/84  Pulse: 92  Resp: 20          Physical Exam  Constitutional: He is well-developed, well-nourished, and in no distress.  HENT:  Head: Normocephalic.  Right Ear: Tympanic membrane, external ear and ear canal normal.  Left Ear: Tympanic membrane, external ear and ear canal normal.  Nose: Nose normal. No mucosal edema or rhinorrhea.  Mouth/Throat: Uvula is midline, oropharynx is clear and moist and mucous membranes are normal. No oropharyngeal exudate.  Eyes: Conjunctivae are normal.  Neck: Trachea normal. No tracheal tenderness present. No tracheal deviation present. No thyromegaly present.  Cardiovascular: Normal rate, regular rhythm, S1 normal, S2 normal and normal heart sounds.   No murmur heard. Pulmonary/Chest: Breath sounds normal. No stridor. No respiratory distress. He has no wheezes. He has no rales.  Musculoskeletal: He exhibits no edema.  Lymphadenopathy:       Head (right side): No tonsillar adenopathy present.       Head (left side): No tonsillar adenopathy present.    He has no cervical adenopathy.  Neurological: He is alert. Gait normal.  Skin: Rash (Slight erythematous papular eruption involving anterior chest and back) noted. He is not diaphoretic. No erythema. Nails show no clubbing.  Psychiatric: Mood and affect normal.    Diagnostics:    Spirometry was performed and demonstrated an FEV1 of 2.75  .  The patient had an Asthma Control Test with the following results: ACT Total Score: 22.    Assessment and Plan:   1. COPD with asthma (HCC)   2. Other allergic rhinitis   3. Allergic urticaria     1. Every day use cetirizine 10 mg tablet 1 time per day. Can increase to twice a day if needed  2. Continue Trelegy one inhalation 1 time per day  3. Continue montelukast 10 mg daily  4. Continue Flonase 1-2 sprays each nostril  daily  5. Continue Ventolin HFA if needed    6. Return to clinic in 6 months or earlier if problem  Overall Adrian [facility] appears to be doing relatively well regarding all the issues for which he is followed in this clinic and I am going to continue him on a combination inhaler and a leukotriene modifier nasal steroid and an antihistamine and he will contact me should he develop significant problems within next 6 months. Otherwise he'll just return to this clinic in 6 months. I assume that his dermatitis that came about from the administration of a "penicillin" will probably burn out as it is already improving since he is getting further way from the administration of that drug.  Laurette Schimke, MD Allergy /  Immunology [facility]

## 2016-07-13 DIAGNOSIS — G4700 Insomnia, unspecified: Secondary | ICD-10-CM | POA: Diagnosis not present

## 2016-07-13 DIAGNOSIS — I10 Essential (primary) hypertension: Secondary | ICD-10-CM | POA: Diagnosis not present

## 2016-07-13 DIAGNOSIS — F419 Anxiety disorder, unspecified: Secondary | ICD-10-CM | POA: Diagnosis not present

## 2016-07-13 DIAGNOSIS — Z79899 Other long term (current) drug therapy: Secondary | ICD-10-CM | POA: Diagnosis not present

## 2016-07-13 DIAGNOSIS — E785 Hyperlipidemia, unspecified: Secondary | ICD-10-CM | POA: Diagnosis not present

## 2016-07-13 DIAGNOSIS — R52 Pain, unspecified: Secondary | ICD-10-CM | POA: Diagnosis not present

## 2016-07-18 DIAGNOSIS — I10 Essential (primary) hypertension: Secondary | ICD-10-CM | POA: Diagnosis not present

## 2016-07-18 DIAGNOSIS — Z789 Other specified health status: Secondary | ICD-10-CM | POA: Diagnosis not present

## 2016-07-18 DIAGNOSIS — E785 Hyperlipidemia, unspecified: Secondary | ICD-10-CM | POA: Diagnosis not present

## 2016-07-18 DIAGNOSIS — F419 Anxiety disorder, unspecified: Secondary | ICD-10-CM | POA: Diagnosis not present

## 2016-07-19 DIAGNOSIS — E785 Hyperlipidemia, unspecified: Secondary | ICD-10-CM | POA: Diagnosis not present

## 2016-07-19 DIAGNOSIS — E559 Vitamin D deficiency, unspecified: Secondary | ICD-10-CM | POA: Diagnosis not present

## 2016-07-19 DIAGNOSIS — F1120 Opioid dependence, uncomplicated: Secondary | ICD-10-CM | POA: Diagnosis not present

## 2016-07-19 DIAGNOSIS — E875 Hyperkalemia: Secondary | ICD-10-CM | POA: Diagnosis not present

## 2016-07-19 DIAGNOSIS — E871 Hypo-osmolality and hyponatremia: Secondary | ICD-10-CM | POA: Diagnosis not present

## 2016-07-19 DIAGNOSIS — Z79891 Long term (current) use of opiate analgesic: Secondary | ICD-10-CM | POA: Diagnosis not present

## 2016-07-19 DIAGNOSIS — I10 Essential (primary) hypertension: Secondary | ICD-10-CM | POA: Diagnosis not present

## 2016-07-19 DIAGNOSIS — M47816 Spondylosis without myelopathy or radiculopathy, lumbar region: Secondary | ICD-10-CM | POA: Diagnosis not present

## 2016-07-19 DIAGNOSIS — Z1389 Encounter for screening for other disorder: Secondary | ICD-10-CM | POA: Diagnosis not present

## 2016-07-19 DIAGNOSIS — E669 Obesity, unspecified: Secondary | ICD-10-CM | POA: Diagnosis not present

## 2016-07-19 DIAGNOSIS — G894 Chronic pain syndrome: Secondary | ICD-10-CM | POA: Diagnosis not present

## 2016-07-19 DIAGNOSIS — M5136 Other intervertebral disc degeneration, lumbar region: Secondary | ICD-10-CM | POA: Diagnosis not present

## 2016-07-19 DIAGNOSIS — D519 Vitamin B12 deficiency anemia, unspecified: Secondary | ICD-10-CM | POA: Diagnosis not present

## 2016-07-27 DIAGNOSIS — F419 Anxiety disorder, unspecified: Secondary | ICD-10-CM | POA: Diagnosis not present

## 2016-07-27 DIAGNOSIS — I10 Essential (primary) hypertension: Secondary | ICD-10-CM | POA: Diagnosis not present

## 2016-07-27 DIAGNOSIS — E785 Hyperlipidemia, unspecified: Secondary | ICD-10-CM | POA: Diagnosis not present

## 2016-07-27 DIAGNOSIS — R52 Pain, unspecified: Secondary | ICD-10-CM | POA: Diagnosis not present

## 2016-07-27 DIAGNOSIS — Z79899 Other long term (current) drug therapy: Secondary | ICD-10-CM | POA: Diagnosis not present

## 2016-07-27 DIAGNOSIS — G4700 Insomnia, unspecified: Secondary | ICD-10-CM | POA: Diagnosis not present

## 2016-08-09 DIAGNOSIS — I10 Essential (primary) hypertension: Secondary | ICD-10-CM | POA: Diagnosis not present

## 2016-08-09 DIAGNOSIS — F419 Anxiety disorder, unspecified: Secondary | ICD-10-CM | POA: Diagnosis not present

## 2016-08-09 DIAGNOSIS — E785 Hyperlipidemia, unspecified: Secondary | ICD-10-CM | POA: Diagnosis not present

## 2016-08-09 DIAGNOSIS — Z79899 Other long term (current) drug therapy: Secondary | ICD-10-CM | POA: Diagnosis not present

## 2016-08-09 DIAGNOSIS — G4700 Insomnia, unspecified: Secondary | ICD-10-CM | POA: Diagnosis not present

## 2016-08-09 DIAGNOSIS — R52 Pain, unspecified: Secondary | ICD-10-CM | POA: Diagnosis not present

## 2016-08-16 DIAGNOSIS — R112 Nausea with vomiting, unspecified: Secondary | ICD-10-CM | POA: Diagnosis not present

## 2016-08-16 DIAGNOSIS — R197 Diarrhea, unspecified: Secondary | ICD-10-CM | POA: Diagnosis not present

## 2016-08-16 DIAGNOSIS — M47816 Spondylosis without myelopathy or radiculopathy, lumbar region: Secondary | ICD-10-CM | POA: Diagnosis not present

## 2016-08-16 DIAGNOSIS — F1120 Opioid dependence, uncomplicated: Secondary | ICD-10-CM | POA: Diagnosis not present

## 2016-08-16 DIAGNOSIS — S50861A Insect bite (nonvenomous) of right forearm, initial encounter: Secondary | ICD-10-CM | POA: Diagnosis not present

## 2016-08-16 DIAGNOSIS — M5136 Other intervertebral disc degeneration, lumbar region: Secondary | ICD-10-CM | POA: Diagnosis not present

## 2016-08-16 DIAGNOSIS — Z79891 Long term (current) use of opiate analgesic: Secondary | ICD-10-CM | POA: Diagnosis not present

## 2016-08-16 DIAGNOSIS — G894 Chronic pain syndrome: Secondary | ICD-10-CM | POA: Diagnosis not present

## 2016-08-23 DIAGNOSIS — I10 Essential (primary) hypertension: Secondary | ICD-10-CM | POA: Diagnosis not present

## 2016-08-23 DIAGNOSIS — E785 Hyperlipidemia, unspecified: Secondary | ICD-10-CM | POA: Diagnosis not present

## 2016-08-23 DIAGNOSIS — G4700 Insomnia, unspecified: Secondary | ICD-10-CM | POA: Diagnosis not present

## 2016-08-23 DIAGNOSIS — R52 Pain, unspecified: Secondary | ICD-10-CM | POA: Diagnosis not present

## 2016-08-23 DIAGNOSIS — F419 Anxiety disorder, unspecified: Secondary | ICD-10-CM | POA: Diagnosis not present

## 2016-08-23 DIAGNOSIS — Z79899 Other long term (current) drug therapy: Secondary | ICD-10-CM | POA: Diagnosis not present

## 2016-08-31 DIAGNOSIS — G4733 Obstructive sleep apnea (adult) (pediatric): Secondary | ICD-10-CM | POA: Diagnosis not present

## 2016-09-07 DIAGNOSIS — R52 Pain, unspecified: Secondary | ICD-10-CM | POA: Diagnosis not present

## 2016-09-07 DIAGNOSIS — I10 Essential (primary) hypertension: Secondary | ICD-10-CM | POA: Diagnosis not present

## 2016-09-07 DIAGNOSIS — Z79899 Other long term (current) drug therapy: Secondary | ICD-10-CM | POA: Diagnosis not present

## 2016-09-07 DIAGNOSIS — G4700 Insomnia, unspecified: Secondary | ICD-10-CM | POA: Diagnosis not present

## 2016-09-07 DIAGNOSIS — F419 Anxiety disorder, unspecified: Secondary | ICD-10-CM | POA: Diagnosis not present

## 2016-09-07 DIAGNOSIS — E785 Hyperlipidemia, unspecified: Secondary | ICD-10-CM | POA: Diagnosis not present

## 2016-09-13 DIAGNOSIS — M5136 Other intervertebral disc degeneration, lumbar region: Secondary | ICD-10-CM | POA: Diagnosis not present

## 2016-09-13 DIAGNOSIS — Z79891 Long term (current) use of opiate analgesic: Secondary | ICD-10-CM | POA: Diagnosis not present

## 2016-09-13 DIAGNOSIS — M47816 Spondylosis without myelopathy or radiculopathy, lumbar region: Secondary | ICD-10-CM | POA: Diagnosis not present

## 2016-09-13 DIAGNOSIS — F1120 Opioid dependence, uncomplicated: Secondary | ICD-10-CM | POA: Diagnosis not present

## 2016-09-13 DIAGNOSIS — G894 Chronic pain syndrome: Secondary | ICD-10-CM | POA: Diagnosis not present

## 2016-09-15 DIAGNOSIS — G4733 Obstructive sleep apnea (adult) (pediatric): Secondary | ICD-10-CM | POA: Diagnosis not present

## 2016-09-28 DIAGNOSIS — G4700 Insomnia, unspecified: Secondary | ICD-10-CM | POA: Diagnosis not present

## 2016-09-28 DIAGNOSIS — R52 Pain, unspecified: Secondary | ICD-10-CM | POA: Diagnosis not present

## 2016-09-28 DIAGNOSIS — I10 Essential (primary) hypertension: Secondary | ICD-10-CM | POA: Diagnosis not present

## 2016-09-28 DIAGNOSIS — F419 Anxiety disorder, unspecified: Secondary | ICD-10-CM | POA: Diagnosis not present

## 2016-09-28 DIAGNOSIS — E785 Hyperlipidemia, unspecified: Secondary | ICD-10-CM | POA: Diagnosis not present

## 2016-09-28 DIAGNOSIS — Z79899 Other long term (current) drug therapy: Secondary | ICD-10-CM | POA: Diagnosis not present

## 2016-10-11 DIAGNOSIS — E871 Hypo-osmolality and hyponatremia: Secondary | ICD-10-CM | POA: Diagnosis not present

## 2016-10-12 DIAGNOSIS — E785 Hyperlipidemia, unspecified: Secondary | ICD-10-CM | POA: Diagnosis not present

## 2016-10-12 DIAGNOSIS — R52 Pain, unspecified: Secondary | ICD-10-CM | POA: Diagnosis not present

## 2016-10-12 DIAGNOSIS — F419 Anxiety disorder, unspecified: Secondary | ICD-10-CM | POA: Diagnosis not present

## 2016-10-12 DIAGNOSIS — M47816 Spondylosis without myelopathy or radiculopathy, lumbar region: Secondary | ICD-10-CM | POA: Diagnosis not present

## 2016-10-12 DIAGNOSIS — I10 Essential (primary) hypertension: Secondary | ICD-10-CM | POA: Diagnosis not present

## 2016-10-12 DIAGNOSIS — Z79899 Other long term (current) drug therapy: Secondary | ICD-10-CM | POA: Diagnosis not present

## 2016-10-12 DIAGNOSIS — G894 Chronic pain syndrome: Secondary | ICD-10-CM | POA: Diagnosis not present

## 2016-10-12 DIAGNOSIS — Z79891 Long term (current) use of opiate analgesic: Secondary | ICD-10-CM | POA: Diagnosis not present

## 2016-10-12 DIAGNOSIS — M5136 Other intervertebral disc degeneration, lumbar region: Secondary | ICD-10-CM | POA: Diagnosis not present

## 2016-10-12 DIAGNOSIS — G4700 Insomnia, unspecified: Secondary | ICD-10-CM | POA: Diagnosis not present

## 2016-10-12 DIAGNOSIS — F1120 Opioid dependence, uncomplicated: Secondary | ICD-10-CM | POA: Diagnosis not present

## 2016-10-19 DIAGNOSIS — F1120 Opioid dependence, uncomplicated: Secondary | ICD-10-CM | POA: Diagnosis not present

## 2016-10-19 DIAGNOSIS — Z79891 Long term (current) use of opiate analgesic: Secondary | ICD-10-CM | POA: Diagnosis not present

## 2016-10-19 DIAGNOSIS — G894 Chronic pain syndrome: Secondary | ICD-10-CM | POA: Diagnosis not present

## 2016-10-19 DIAGNOSIS — M47816 Spondylosis without myelopathy or radiculopathy, lumbar region: Secondary | ICD-10-CM | POA: Diagnosis not present

## 2016-10-19 DIAGNOSIS — M5136 Other intervertebral disc degeneration, lumbar region: Secondary | ICD-10-CM | POA: Diagnosis not present

## 2016-10-25 DIAGNOSIS — I10 Essential (primary) hypertension: Secondary | ICD-10-CM | POA: Diagnosis not present

## 2016-10-25 DIAGNOSIS — R52 Pain, unspecified: Secondary | ICD-10-CM | POA: Diagnosis not present

## 2016-10-25 DIAGNOSIS — Z79899 Other long term (current) drug therapy: Secondary | ICD-10-CM | POA: Diagnosis not present

## 2016-10-25 DIAGNOSIS — E785 Hyperlipidemia, unspecified: Secondary | ICD-10-CM | POA: Diagnosis not present

## 2016-10-28 DIAGNOSIS — G4733 Obstructive sleep apnea (adult) (pediatric): Secondary | ICD-10-CM | POA: Diagnosis not present

## 2016-10-28 DIAGNOSIS — J45909 Unspecified asthma, uncomplicated: Secondary | ICD-10-CM | POA: Diagnosis not present

## 2016-11-01 DIAGNOSIS — Z79899 Other long term (current) drug therapy: Secondary | ICD-10-CM | POA: Diagnosis not present

## 2016-11-01 DIAGNOSIS — E785 Hyperlipidemia, unspecified: Secondary | ICD-10-CM | POA: Diagnosis not present

## 2016-11-01 DIAGNOSIS — I10 Essential (primary) hypertension: Secondary | ICD-10-CM | POA: Diagnosis not present

## 2016-11-01 DIAGNOSIS — G4700 Insomnia, unspecified: Secondary | ICD-10-CM | POA: Diagnosis not present

## 2016-11-01 DIAGNOSIS — F419 Anxiety disorder, unspecified: Secondary | ICD-10-CM | POA: Diagnosis not present

## 2016-11-01 DIAGNOSIS — R52 Pain, unspecified: Secondary | ICD-10-CM | POA: Diagnosis not present

## 2016-11-04 DIAGNOSIS — Z23 Encounter for immunization: Secondary | ICD-10-CM | POA: Diagnosis not present

## 2016-11-07 DIAGNOSIS — G4733 Obstructive sleep apnea (adult) (pediatric): Secondary | ICD-10-CM | POA: Diagnosis not present

## 2016-11-07 DIAGNOSIS — J45909 Unspecified asthma, uncomplicated: Secondary | ICD-10-CM | POA: Diagnosis not present

## 2016-11-09 DIAGNOSIS — G894 Chronic pain syndrome: Secondary | ICD-10-CM | POA: Diagnosis not present

## 2016-11-09 DIAGNOSIS — F1120 Opioid dependence, uncomplicated: Secondary | ICD-10-CM | POA: Diagnosis not present

## 2016-11-09 DIAGNOSIS — M47816 Spondylosis without myelopathy or radiculopathy, lumbar region: Secondary | ICD-10-CM | POA: Diagnosis not present

## 2016-11-09 DIAGNOSIS — Z79891 Long term (current) use of opiate analgesic: Secondary | ICD-10-CM | POA: Diagnosis not present

## 2016-11-09 DIAGNOSIS — M5136 Other intervertebral disc degeneration, lumbar region: Secondary | ICD-10-CM | POA: Diagnosis not present

## 2016-11-15 DIAGNOSIS — G4700 Insomnia, unspecified: Secondary | ICD-10-CM | POA: Diagnosis not present

## 2016-11-15 DIAGNOSIS — R52 Pain, unspecified: Secondary | ICD-10-CM | POA: Diagnosis not present

## 2016-11-15 DIAGNOSIS — E785 Hyperlipidemia, unspecified: Secondary | ICD-10-CM | POA: Diagnosis not present

## 2016-11-15 DIAGNOSIS — Z79899 Other long term (current) drug therapy: Secondary | ICD-10-CM | POA: Diagnosis not present

## 2016-11-15 DIAGNOSIS — I10 Essential (primary) hypertension: Secondary | ICD-10-CM | POA: Diagnosis not present

## 2016-11-15 DIAGNOSIS — F419 Anxiety disorder, unspecified: Secondary | ICD-10-CM | POA: Diagnosis not present

## 2016-11-21 DIAGNOSIS — R05 Cough: Secondary | ICD-10-CM | POA: Diagnosis not present

## 2016-11-21 DIAGNOSIS — J019 Acute sinusitis, unspecified: Secondary | ICD-10-CM | POA: Diagnosis not present

## 2016-11-21 DIAGNOSIS — R070 Pain in throat: Secondary | ICD-10-CM | POA: Diagnosis not present

## 2016-11-30 DIAGNOSIS — R52 Pain, unspecified: Secondary | ICD-10-CM | POA: Diagnosis not present

## 2016-11-30 DIAGNOSIS — I10 Essential (primary) hypertension: Secondary | ICD-10-CM | POA: Diagnosis not present

## 2016-11-30 DIAGNOSIS — F419 Anxiety disorder, unspecified: Secondary | ICD-10-CM | POA: Diagnosis not present

## 2016-11-30 DIAGNOSIS — G4700 Insomnia, unspecified: Secondary | ICD-10-CM | POA: Diagnosis not present

## 2016-11-30 DIAGNOSIS — Z79899 Other long term (current) drug therapy: Secondary | ICD-10-CM | POA: Diagnosis not present

## 2016-11-30 DIAGNOSIS — E785 Hyperlipidemia, unspecified: Secondary | ICD-10-CM | POA: Diagnosis not present

## 2016-12-08 DIAGNOSIS — J45909 Unspecified asthma, uncomplicated: Secondary | ICD-10-CM | POA: Diagnosis not present

## 2016-12-08 DIAGNOSIS — I10 Essential (primary) hypertension: Secondary | ICD-10-CM | POA: Diagnosis not present

## 2016-12-08 DIAGNOSIS — R52 Pain, unspecified: Secondary | ICD-10-CM | POA: Diagnosis not present

## 2016-12-08 DIAGNOSIS — G4700 Insomnia, unspecified: Secondary | ICD-10-CM | POA: Diagnosis not present

## 2016-12-08 DIAGNOSIS — E785 Hyperlipidemia, unspecified: Secondary | ICD-10-CM | POA: Diagnosis not present

## 2016-12-08 DIAGNOSIS — F419 Anxiety disorder, unspecified: Secondary | ICD-10-CM | POA: Diagnosis not present

## 2016-12-08 DIAGNOSIS — Z79899 Other long term (current) drug therapy: Secondary | ICD-10-CM | POA: Diagnosis not present

## 2016-12-08 DIAGNOSIS — G4733 Obstructive sleep apnea (adult) (pediatric): Secondary | ICD-10-CM | POA: Diagnosis not present

## 2016-12-12 DIAGNOSIS — Z79891 Long term (current) use of opiate analgesic: Secondary | ICD-10-CM | POA: Diagnosis not present

## 2016-12-12 DIAGNOSIS — J449 Chronic obstructive pulmonary disease, unspecified: Secondary | ICD-10-CM | POA: Diagnosis not present

## 2016-12-12 DIAGNOSIS — G894 Chronic pain syndrome: Secondary | ICD-10-CM | POA: Diagnosis not present

## 2016-12-12 DIAGNOSIS — M47816 Spondylosis without myelopathy or radiculopathy, lumbar region: Secondary | ICD-10-CM | POA: Diagnosis not present

## 2016-12-12 DIAGNOSIS — F1120 Opioid dependence, uncomplicated: Secondary | ICD-10-CM | POA: Diagnosis not present

## 2016-12-12 DIAGNOSIS — M5136 Other intervertebral disc degeneration, lumbar region: Secondary | ICD-10-CM | POA: Diagnosis not present

## 2016-12-15 DIAGNOSIS — G4733 Obstructive sleep apnea (adult) (pediatric): Secondary | ICD-10-CM | POA: Diagnosis not present

## 2016-12-21 DIAGNOSIS — F1120 Opioid dependence, uncomplicated: Secondary | ICD-10-CM | POA: Diagnosis not present

## 2016-12-21 DIAGNOSIS — M47816 Spondylosis without myelopathy or radiculopathy, lumbar region: Secondary | ICD-10-CM | POA: Diagnosis not present

## 2016-12-21 DIAGNOSIS — M5136 Other intervertebral disc degeneration, lumbar region: Secondary | ICD-10-CM | POA: Diagnosis not present

## 2016-12-21 DIAGNOSIS — J449 Chronic obstructive pulmonary disease, unspecified: Secondary | ICD-10-CM | POA: Diagnosis not present

## 2016-12-21 DIAGNOSIS — G894 Chronic pain syndrome: Secondary | ICD-10-CM | POA: Diagnosis not present

## 2016-12-21 DIAGNOSIS — G4733 Obstructive sleep apnea (adult) (pediatric): Secondary | ICD-10-CM | POA: Diagnosis not present

## 2016-12-21 DIAGNOSIS — Z79891 Long term (current) use of opiate analgesic: Secondary | ICD-10-CM | POA: Diagnosis not present

## 2016-12-26 DIAGNOSIS — Z79899 Other long term (current) drug therapy: Secondary | ICD-10-CM | POA: Diagnosis not present

## 2016-12-26 DIAGNOSIS — I10 Essential (primary) hypertension: Secondary | ICD-10-CM | POA: Diagnosis not present

## 2016-12-26 DIAGNOSIS — R52 Pain, unspecified: Secondary | ICD-10-CM | POA: Diagnosis not present

## 2016-12-26 DIAGNOSIS — E785 Hyperlipidemia, unspecified: Secondary | ICD-10-CM | POA: Diagnosis not present

## 2016-12-27 DIAGNOSIS — G4700 Insomnia, unspecified: Secondary | ICD-10-CM | POA: Diagnosis not present

## 2016-12-27 DIAGNOSIS — Z79899 Other long term (current) drug therapy: Secondary | ICD-10-CM | POA: Diagnosis not present

## 2016-12-27 DIAGNOSIS — I10 Essential (primary) hypertension: Secondary | ICD-10-CM | POA: Diagnosis not present

## 2016-12-27 DIAGNOSIS — R52 Pain, unspecified: Secondary | ICD-10-CM | POA: Diagnosis not present

## 2016-12-27 DIAGNOSIS — F419 Anxiety disorder, unspecified: Secondary | ICD-10-CM | POA: Diagnosis not present

## 2016-12-27 DIAGNOSIS — E785 Hyperlipidemia, unspecified: Secondary | ICD-10-CM | POA: Diagnosis not present

## 2017-01-07 DIAGNOSIS — J45909 Unspecified asthma, uncomplicated: Secondary | ICD-10-CM | POA: Diagnosis not present

## 2017-01-07 DIAGNOSIS — G4733 Obstructive sleep apnea (adult) (pediatric): Secondary | ICD-10-CM | POA: Diagnosis not present

## 2017-01-09 ENCOUNTER — Ambulatory Visit: Payer: Medicare HMO | Admitting: Allergy and Immunology

## 2017-01-10 DIAGNOSIS — M47816 Spondylosis without myelopathy or radiculopathy, lumbar region: Secondary | ICD-10-CM | POA: Diagnosis not present

## 2017-01-10 DIAGNOSIS — Z79899 Other long term (current) drug therapy: Secondary | ICD-10-CM | POA: Diagnosis not present

## 2017-01-10 DIAGNOSIS — I10 Essential (primary) hypertension: Secondary | ICD-10-CM | POA: Diagnosis not present

## 2017-01-10 DIAGNOSIS — M5136 Other intervertebral disc degeneration, lumbar region: Secondary | ICD-10-CM | POA: Diagnosis not present

## 2017-01-10 DIAGNOSIS — F419 Anxiety disorder, unspecified: Secondary | ICD-10-CM | POA: Diagnosis not present

## 2017-01-10 DIAGNOSIS — J449 Chronic obstructive pulmonary disease, unspecified: Secondary | ICD-10-CM | POA: Diagnosis not present

## 2017-01-10 DIAGNOSIS — Z79891 Long term (current) use of opiate analgesic: Secondary | ICD-10-CM | POA: Diagnosis not present

## 2017-01-10 DIAGNOSIS — G4700 Insomnia, unspecified: Secondary | ICD-10-CM | POA: Diagnosis not present

## 2017-01-10 DIAGNOSIS — G894 Chronic pain syndrome: Secondary | ICD-10-CM | POA: Diagnosis not present

## 2017-01-10 DIAGNOSIS — F1120 Opioid dependence, uncomplicated: Secondary | ICD-10-CM | POA: Diagnosis not present

## 2017-01-10 DIAGNOSIS — E785 Hyperlipidemia, unspecified: Secondary | ICD-10-CM | POA: Diagnosis not present

## 2017-01-10 DIAGNOSIS — R52 Pain, unspecified: Secondary | ICD-10-CM | POA: Diagnosis not present

## 2017-01-27 DIAGNOSIS — R52 Pain, unspecified: Secondary | ICD-10-CM | POA: Diagnosis not present

## 2017-01-27 DIAGNOSIS — I10 Essential (primary) hypertension: Secondary | ICD-10-CM | POA: Diagnosis not present

## 2017-01-27 DIAGNOSIS — Z79899 Other long term (current) drug therapy: Secondary | ICD-10-CM | POA: Diagnosis not present

## 2017-01-27 DIAGNOSIS — E785 Hyperlipidemia, unspecified: Secondary | ICD-10-CM | POA: Diagnosis not present

## 2017-01-27 DIAGNOSIS — F419 Anxiety disorder, unspecified: Secondary | ICD-10-CM | POA: Diagnosis not present

## 2017-01-27 DIAGNOSIS — G4700 Insomnia, unspecified: Secondary | ICD-10-CM | POA: Diagnosis not present

## 2017-02-07 DIAGNOSIS — G4733 Obstructive sleep apnea (adult) (pediatric): Secondary | ICD-10-CM | POA: Diagnosis not present

## 2017-02-07 DIAGNOSIS — G894 Chronic pain syndrome: Secondary | ICD-10-CM | POA: Diagnosis not present

## 2017-02-07 DIAGNOSIS — J45909 Unspecified asthma, uncomplicated: Secondary | ICD-10-CM | POA: Diagnosis not present

## 2017-02-08 DIAGNOSIS — I10 Essential (primary) hypertension: Secondary | ICD-10-CM | POA: Diagnosis not present

## 2017-02-08 DIAGNOSIS — R52 Pain, unspecified: Secondary | ICD-10-CM | POA: Diagnosis not present

## 2017-02-08 DIAGNOSIS — F419 Anxiety disorder, unspecified: Secondary | ICD-10-CM | POA: Diagnosis not present

## 2017-02-08 DIAGNOSIS — E785 Hyperlipidemia, unspecified: Secondary | ICD-10-CM | POA: Diagnosis not present

## 2017-02-08 DIAGNOSIS — G4700 Insomnia, unspecified: Secondary | ICD-10-CM | POA: Diagnosis not present

## 2017-02-08 DIAGNOSIS — Z79899 Other long term (current) drug therapy: Secondary | ICD-10-CM | POA: Diagnosis not present

## 2017-02-10 DIAGNOSIS — J449 Chronic obstructive pulmonary disease, unspecified: Secondary | ICD-10-CM | POA: Diagnosis not present

## 2017-02-10 DIAGNOSIS — F1120 Opioid dependence, uncomplicated: Secondary | ICD-10-CM | POA: Diagnosis not present

## 2017-02-10 DIAGNOSIS — Z79891 Long term (current) use of opiate analgesic: Secondary | ICD-10-CM | POA: Diagnosis not present

## 2017-02-10 DIAGNOSIS — M47816 Spondylosis without myelopathy or radiculopathy, lumbar region: Secondary | ICD-10-CM | POA: Diagnosis not present

## 2017-02-10 DIAGNOSIS — G894 Chronic pain syndrome: Secondary | ICD-10-CM | POA: Diagnosis not present

## 2017-02-10 DIAGNOSIS — M5136 Other intervertebral disc degeneration, lumbar region: Secondary | ICD-10-CM | POA: Diagnosis not present

## 2017-02-24 DIAGNOSIS — E785 Hyperlipidemia, unspecified: Secondary | ICD-10-CM | POA: Diagnosis not present

## 2017-02-24 DIAGNOSIS — I10 Essential (primary) hypertension: Secondary | ICD-10-CM | POA: Diagnosis not present

## 2017-02-24 DIAGNOSIS — Z79899 Other long term (current) drug therapy: Secondary | ICD-10-CM | POA: Diagnosis not present

## 2017-02-24 DIAGNOSIS — R52 Pain, unspecified: Secondary | ICD-10-CM | POA: Diagnosis not present

## 2017-03-01 DIAGNOSIS — R52 Pain, unspecified: Secondary | ICD-10-CM | POA: Diagnosis not present

## 2017-03-01 DIAGNOSIS — F419 Anxiety disorder, unspecified: Secondary | ICD-10-CM | POA: Diagnosis not present

## 2017-03-01 DIAGNOSIS — G4700 Insomnia, unspecified: Secondary | ICD-10-CM | POA: Diagnosis not present

## 2017-03-01 DIAGNOSIS — E785 Hyperlipidemia, unspecified: Secondary | ICD-10-CM | POA: Diagnosis not present

## 2017-03-01 DIAGNOSIS — I10 Essential (primary) hypertension: Secondary | ICD-10-CM | POA: Diagnosis not present

## 2017-03-01 DIAGNOSIS — Z79899 Other long term (current) drug therapy: Secondary | ICD-10-CM | POA: Diagnosis not present

## 2017-03-10 DIAGNOSIS — J45909 Unspecified asthma, uncomplicated: Secondary | ICD-10-CM | POA: Diagnosis not present

## 2017-03-10 DIAGNOSIS — G4733 Obstructive sleep apnea (adult) (pediatric): Secondary | ICD-10-CM | POA: Diagnosis not present

## 2017-03-13 DIAGNOSIS — M5136 Other intervertebral disc degeneration, lumbar region: Secondary | ICD-10-CM | POA: Diagnosis not present

## 2017-03-13 DIAGNOSIS — Z79891 Long term (current) use of opiate analgesic: Secondary | ICD-10-CM | POA: Diagnosis not present

## 2017-03-13 DIAGNOSIS — F1120 Opioid dependence, uncomplicated: Secondary | ICD-10-CM | POA: Diagnosis not present

## 2017-03-13 DIAGNOSIS — J449 Chronic obstructive pulmonary disease, unspecified: Secondary | ICD-10-CM | POA: Diagnosis not present

## 2017-03-13 DIAGNOSIS — M47816 Spondylosis without myelopathy or radiculopathy, lumbar region: Secondary | ICD-10-CM | POA: Diagnosis not present

## 2017-03-13 DIAGNOSIS — G894 Chronic pain syndrome: Secondary | ICD-10-CM | POA: Diagnosis not present

## 2017-03-15 DIAGNOSIS — E785 Hyperlipidemia, unspecified: Secondary | ICD-10-CM | POA: Diagnosis not present

## 2017-03-15 DIAGNOSIS — Z79899 Other long term (current) drug therapy: Secondary | ICD-10-CM | POA: Diagnosis not present

## 2017-03-15 DIAGNOSIS — R52 Pain, unspecified: Secondary | ICD-10-CM | POA: Diagnosis not present

## 2017-03-15 DIAGNOSIS — F419 Anxiety disorder, unspecified: Secondary | ICD-10-CM | POA: Diagnosis not present

## 2017-03-15 DIAGNOSIS — G4700 Insomnia, unspecified: Secondary | ICD-10-CM | POA: Diagnosis not present

## 2017-03-15 DIAGNOSIS — I10 Essential (primary) hypertension: Secondary | ICD-10-CM | POA: Diagnosis not present

## 2017-03-24 DIAGNOSIS — J449 Chronic obstructive pulmonary disease, unspecified: Secondary | ICD-10-CM | POA: Diagnosis not present

## 2017-03-24 DIAGNOSIS — G4733 Obstructive sleep apnea (adult) (pediatric): Secondary | ICD-10-CM | POA: Diagnosis not present

## 2017-03-29 DIAGNOSIS — M47816 Spondylosis without myelopathy or radiculopathy, lumbar region: Secondary | ICD-10-CM | POA: Diagnosis not present

## 2017-03-29 DIAGNOSIS — F419 Anxiety disorder, unspecified: Secondary | ICD-10-CM | POA: Diagnosis not present

## 2017-03-29 DIAGNOSIS — G4700 Insomnia, unspecified: Secondary | ICD-10-CM | POA: Diagnosis not present

## 2017-03-29 DIAGNOSIS — E785 Hyperlipidemia, unspecified: Secondary | ICD-10-CM | POA: Diagnosis not present

## 2017-03-29 DIAGNOSIS — F1120 Opioid dependence, uncomplicated: Secondary | ICD-10-CM | POA: Diagnosis not present

## 2017-03-29 DIAGNOSIS — I10 Essential (primary) hypertension: Secondary | ICD-10-CM | POA: Diagnosis not present

## 2017-03-29 DIAGNOSIS — G894 Chronic pain syndrome: Secondary | ICD-10-CM | POA: Diagnosis not present

## 2017-03-29 DIAGNOSIS — Z79891 Long term (current) use of opiate analgesic: Secondary | ICD-10-CM | POA: Diagnosis not present

## 2017-03-29 DIAGNOSIS — R52 Pain, unspecified: Secondary | ICD-10-CM | POA: Diagnosis not present

## 2017-03-29 DIAGNOSIS — J449 Chronic obstructive pulmonary disease, unspecified: Secondary | ICD-10-CM | POA: Diagnosis not present

## 2017-03-29 DIAGNOSIS — M5136 Other intervertebral disc degeneration, lumbar region: Secondary | ICD-10-CM | POA: Diagnosis not present

## 2017-03-29 DIAGNOSIS — Z79899 Other long term (current) drug therapy: Secondary | ICD-10-CM | POA: Diagnosis not present

## 2017-04-07 DIAGNOSIS — Z79891 Long term (current) use of opiate analgesic: Secondary | ICD-10-CM | POA: Diagnosis not present

## 2017-04-07 DIAGNOSIS — M47816 Spondylosis without myelopathy or radiculopathy, lumbar region: Secondary | ICD-10-CM | POA: Diagnosis not present

## 2017-04-07 DIAGNOSIS — F1120 Opioid dependence, uncomplicated: Secondary | ICD-10-CM | POA: Diagnosis not present

## 2017-04-07 DIAGNOSIS — J449 Chronic obstructive pulmonary disease, unspecified: Secondary | ICD-10-CM | POA: Diagnosis not present

## 2017-04-07 DIAGNOSIS — M5136 Other intervertebral disc degeneration, lumbar region: Secondary | ICD-10-CM | POA: Diagnosis not present

## 2017-04-07 DIAGNOSIS — J45909 Unspecified asthma, uncomplicated: Secondary | ICD-10-CM | POA: Diagnosis not present

## 2017-04-07 DIAGNOSIS — G4733 Obstructive sleep apnea (adult) (pediatric): Secondary | ICD-10-CM | POA: Diagnosis not present

## 2017-04-07 DIAGNOSIS — G894 Chronic pain syndrome: Secondary | ICD-10-CM | POA: Diagnosis not present

## 2017-04-10 DIAGNOSIS — Z136 Encounter for screening for cardiovascular disorders: Secondary | ICD-10-CM | POA: Diagnosis not present

## 2017-04-10 DIAGNOSIS — R609 Edema, unspecified: Secondary | ICD-10-CM | POA: Diagnosis not present

## 2017-04-10 DIAGNOSIS — Z131 Encounter for screening for diabetes mellitus: Secondary | ICD-10-CM | POA: Diagnosis not present

## 2017-04-10 DIAGNOSIS — E871 Hypo-osmolality and hyponatremia: Secondary | ICD-10-CM | POA: Diagnosis not present

## 2017-04-10 DIAGNOSIS — I10 Essential (primary) hypertension: Secondary | ICD-10-CM | POA: Diagnosis not present

## 2017-04-10 DIAGNOSIS — R52 Pain, unspecified: Secondary | ICD-10-CM | POA: Diagnosis not present

## 2017-04-10 DIAGNOSIS — Z79899 Other long term (current) drug therapy: Secondary | ICD-10-CM | POA: Diagnosis not present

## 2017-04-10 DIAGNOSIS — F419 Anxiety disorder, unspecified: Secondary | ICD-10-CM | POA: Diagnosis not present

## 2017-04-10 DIAGNOSIS — E785 Hyperlipidemia, unspecified: Secondary | ICD-10-CM | POA: Diagnosis not present

## 2017-04-10 DIAGNOSIS — E559 Vitamin D deficiency, unspecified: Secondary | ICD-10-CM | POA: Diagnosis not present

## 2017-04-10 DIAGNOSIS — D519 Vitamin B12 deficiency anemia, unspecified: Secondary | ICD-10-CM | POA: Diagnosis not present

## 2017-04-10 DIAGNOSIS — G4700 Insomnia, unspecified: Secondary | ICD-10-CM | POA: Diagnosis not present

## 2017-04-10 DIAGNOSIS — J449 Chronic obstructive pulmonary disease, unspecified: Secondary | ICD-10-CM | POA: Diagnosis not present

## 2017-04-10 DIAGNOSIS — Z720 Tobacco use: Secondary | ICD-10-CM | POA: Diagnosis not present

## 2017-04-24 DIAGNOSIS — D513 Other dietary vitamin B12 deficiency anemia: Secondary | ICD-10-CM | POA: Diagnosis not present

## 2017-04-25 DIAGNOSIS — Z79899 Other long term (current) drug therapy: Secondary | ICD-10-CM | POA: Diagnosis not present

## 2017-04-25 DIAGNOSIS — E785 Hyperlipidemia, unspecified: Secondary | ICD-10-CM | POA: Diagnosis not present

## 2017-04-25 DIAGNOSIS — F419 Anxiety disorder, unspecified: Secondary | ICD-10-CM | POA: Diagnosis not present

## 2017-04-25 DIAGNOSIS — G4700 Insomnia, unspecified: Secondary | ICD-10-CM | POA: Diagnosis not present

## 2017-04-25 DIAGNOSIS — I10 Essential (primary) hypertension: Secondary | ICD-10-CM | POA: Diagnosis not present

## 2017-04-25 DIAGNOSIS — R52 Pain, unspecified: Secondary | ICD-10-CM | POA: Diagnosis not present

## 2017-05-08 DIAGNOSIS — M5136 Other intervertebral disc degeneration, lumbar region: Secondary | ICD-10-CM | POA: Diagnosis not present

## 2017-05-08 DIAGNOSIS — F1120 Opioid dependence, uncomplicated: Secondary | ICD-10-CM | POA: Diagnosis not present

## 2017-05-08 DIAGNOSIS — Z79891 Long term (current) use of opiate analgesic: Secondary | ICD-10-CM | POA: Diagnosis not present

## 2017-05-08 DIAGNOSIS — J449 Chronic obstructive pulmonary disease, unspecified: Secondary | ICD-10-CM | POA: Diagnosis not present

## 2017-05-08 DIAGNOSIS — J45909 Unspecified asthma, uncomplicated: Secondary | ICD-10-CM | POA: Diagnosis not present

## 2017-05-08 DIAGNOSIS — G894 Chronic pain syndrome: Secondary | ICD-10-CM | POA: Diagnosis not present

## 2017-05-08 DIAGNOSIS — G4733 Obstructive sleep apnea (adult) (pediatric): Secondary | ICD-10-CM | POA: Diagnosis not present

## 2017-05-08 DIAGNOSIS — M47816 Spondylosis without myelopathy or radiculopathy, lumbar region: Secondary | ICD-10-CM | POA: Diagnosis not present

## 2017-05-10 DIAGNOSIS — Z79899 Other long term (current) drug therapy: Secondary | ICD-10-CM | POA: Diagnosis not present

## 2017-05-10 DIAGNOSIS — R52 Pain, unspecified: Secondary | ICD-10-CM | POA: Diagnosis not present

## 2017-05-10 DIAGNOSIS — I10 Essential (primary) hypertension: Secondary | ICD-10-CM | POA: Diagnosis not present

## 2017-05-10 DIAGNOSIS — E785 Hyperlipidemia, unspecified: Secondary | ICD-10-CM | POA: Diagnosis not present

## 2017-05-11 DIAGNOSIS — R52 Pain, unspecified: Secondary | ICD-10-CM | POA: Diagnosis not present

## 2017-05-11 DIAGNOSIS — E785 Hyperlipidemia, unspecified: Secondary | ICD-10-CM | POA: Diagnosis not present

## 2017-05-11 DIAGNOSIS — G4700 Insomnia, unspecified: Secondary | ICD-10-CM | POA: Diagnosis not present

## 2017-05-11 DIAGNOSIS — F419 Anxiety disorder, unspecified: Secondary | ICD-10-CM | POA: Diagnosis not present

## 2017-05-11 DIAGNOSIS — Z79899 Other long term (current) drug therapy: Secondary | ICD-10-CM | POA: Diagnosis not present

## 2017-05-11 DIAGNOSIS — I10 Essential (primary) hypertension: Secondary | ICD-10-CM | POA: Diagnosis not present

## 2017-05-25 DIAGNOSIS — D513 Other dietary vitamin B12 deficiency anemia: Secondary | ICD-10-CM | POA: Diagnosis not present

## 2017-05-31 DIAGNOSIS — F419 Anxiety disorder, unspecified: Secondary | ICD-10-CM | POA: Diagnosis not present

## 2017-05-31 DIAGNOSIS — R52 Pain, unspecified: Secondary | ICD-10-CM | POA: Diagnosis not present

## 2017-05-31 DIAGNOSIS — I10 Essential (primary) hypertension: Secondary | ICD-10-CM | POA: Diagnosis not present

## 2017-05-31 DIAGNOSIS — G4700 Insomnia, unspecified: Secondary | ICD-10-CM | POA: Diagnosis not present

## 2017-05-31 DIAGNOSIS — E785 Hyperlipidemia, unspecified: Secondary | ICD-10-CM | POA: Diagnosis not present

## 2017-05-31 DIAGNOSIS — Z79899 Other long term (current) drug therapy: Secondary | ICD-10-CM | POA: Diagnosis not present

## 2017-06-07 DIAGNOSIS — M5136 Other intervertebral disc degeneration, lumbar region: Secondary | ICD-10-CM | POA: Diagnosis not present

## 2017-06-07 DIAGNOSIS — J45909 Unspecified asthma, uncomplicated: Secondary | ICD-10-CM | POA: Diagnosis not present

## 2017-06-07 DIAGNOSIS — F1120 Opioid dependence, uncomplicated: Secondary | ICD-10-CM | POA: Diagnosis not present

## 2017-06-07 DIAGNOSIS — G4733 Obstructive sleep apnea (adult) (pediatric): Secondary | ICD-10-CM | POA: Diagnosis not present

## 2017-06-07 DIAGNOSIS — J449 Chronic obstructive pulmonary disease, unspecified: Secondary | ICD-10-CM | POA: Diagnosis not present

## 2017-06-07 DIAGNOSIS — M47816 Spondylosis without myelopathy or radiculopathy, lumbar region: Secondary | ICD-10-CM | POA: Diagnosis not present

## 2017-06-07 DIAGNOSIS — Z79891 Long term (current) use of opiate analgesic: Secondary | ICD-10-CM | POA: Diagnosis not present

## 2017-06-07 DIAGNOSIS — G894 Chronic pain syndrome: Secondary | ICD-10-CM | POA: Diagnosis not present

## 2017-06-09 DIAGNOSIS — R52 Pain, unspecified: Secondary | ICD-10-CM | POA: Diagnosis not present

## 2017-06-09 DIAGNOSIS — E785 Hyperlipidemia, unspecified: Secondary | ICD-10-CM | POA: Diagnosis not present

## 2017-06-09 DIAGNOSIS — Z79899 Other long term (current) drug therapy: Secondary | ICD-10-CM | POA: Diagnosis not present

## 2017-06-09 DIAGNOSIS — F419 Anxiety disorder, unspecified: Secondary | ICD-10-CM | POA: Diagnosis not present

## 2017-06-09 DIAGNOSIS — G4700 Insomnia, unspecified: Secondary | ICD-10-CM | POA: Diagnosis not present

## 2017-06-09 DIAGNOSIS — I10 Essential (primary) hypertension: Secondary | ICD-10-CM | POA: Diagnosis not present

## 2017-06-22 DIAGNOSIS — G4733 Obstructive sleep apnea (adult) (pediatric): Secondary | ICD-10-CM | POA: Diagnosis not present

## 2017-06-22 DIAGNOSIS — D513 Other dietary vitamin B12 deficiency anemia: Secondary | ICD-10-CM | POA: Diagnosis not present

## 2017-06-28 DIAGNOSIS — I10 Essential (primary) hypertension: Secondary | ICD-10-CM | POA: Diagnosis not present

## 2017-06-28 DIAGNOSIS — Z79899 Other long term (current) drug therapy: Secondary | ICD-10-CM | POA: Diagnosis not present

## 2017-06-28 DIAGNOSIS — E785 Hyperlipidemia, unspecified: Secondary | ICD-10-CM | POA: Diagnosis not present

## 2017-06-28 DIAGNOSIS — R52 Pain, unspecified: Secondary | ICD-10-CM | POA: Diagnosis not present

## 2017-06-29 DIAGNOSIS — G4700 Insomnia, unspecified: Secondary | ICD-10-CM | POA: Diagnosis not present

## 2017-06-29 DIAGNOSIS — I10 Essential (primary) hypertension: Secondary | ICD-10-CM | POA: Diagnosis not present

## 2017-06-29 DIAGNOSIS — F419 Anxiety disorder, unspecified: Secondary | ICD-10-CM | POA: Diagnosis not present

## 2017-06-29 DIAGNOSIS — E785 Hyperlipidemia, unspecified: Secondary | ICD-10-CM | POA: Diagnosis not present

## 2017-06-29 DIAGNOSIS — R52 Pain, unspecified: Secondary | ICD-10-CM | POA: Diagnosis not present

## 2017-06-29 DIAGNOSIS — Z79899 Other long term (current) drug therapy: Secondary | ICD-10-CM | POA: Diagnosis not present

## 2017-07-05 DIAGNOSIS — M47816 Spondylosis without myelopathy or radiculopathy, lumbar region: Secondary | ICD-10-CM | POA: Diagnosis not present

## 2017-07-05 DIAGNOSIS — F1120 Opioid dependence, uncomplicated: Secondary | ICD-10-CM | POA: Diagnosis not present

## 2017-07-05 DIAGNOSIS — M5136 Other intervertebral disc degeneration, lumbar region: Secondary | ICD-10-CM | POA: Diagnosis not present

## 2017-07-05 DIAGNOSIS — G894 Chronic pain syndrome: Secondary | ICD-10-CM | POA: Diagnosis not present

## 2017-07-05 DIAGNOSIS — J449 Chronic obstructive pulmonary disease, unspecified: Secondary | ICD-10-CM | POA: Diagnosis not present

## 2017-07-05 DIAGNOSIS — Z79891 Long term (current) use of opiate analgesic: Secondary | ICD-10-CM | POA: Diagnosis not present

## 2017-07-08 DIAGNOSIS — G4733 Obstructive sleep apnea (adult) (pediatric): Secondary | ICD-10-CM | POA: Diagnosis not present

## 2017-07-08 DIAGNOSIS — J45909 Unspecified asthma, uncomplicated: Secondary | ICD-10-CM | POA: Diagnosis not present

## 2017-07-17 ENCOUNTER — Other Ambulatory Visit: Payer: Self-pay | Admitting: Allergy and Immunology

## 2017-07-17 DIAGNOSIS — F419 Anxiety disorder, unspecified: Secondary | ICD-10-CM | POA: Diagnosis not present

## 2017-07-17 DIAGNOSIS — E785 Hyperlipidemia, unspecified: Secondary | ICD-10-CM | POA: Diagnosis not present

## 2017-07-17 DIAGNOSIS — Z79899 Other long term (current) drug therapy: Secondary | ICD-10-CM | POA: Diagnosis not present

## 2017-07-17 DIAGNOSIS — I10 Essential (primary) hypertension: Secondary | ICD-10-CM | POA: Diagnosis not present

## 2017-07-17 DIAGNOSIS — G4700 Insomnia, unspecified: Secondary | ICD-10-CM | POA: Diagnosis not present

## 2017-07-17 DIAGNOSIS — R52 Pain, unspecified: Secondary | ICD-10-CM | POA: Diagnosis not present

## 2017-07-18 DIAGNOSIS — D513 Other dietary vitamin B12 deficiency anemia: Secondary | ICD-10-CM | POA: Diagnosis not present

## 2017-08-01 DIAGNOSIS — J449 Chronic obstructive pulmonary disease, unspecified: Secondary | ICD-10-CM | POA: Diagnosis not present

## 2017-08-01 DIAGNOSIS — Z79891 Long term (current) use of opiate analgesic: Secondary | ICD-10-CM | POA: Diagnosis not present

## 2017-08-01 DIAGNOSIS — M47816 Spondylosis without myelopathy or radiculopathy, lumbar region: Secondary | ICD-10-CM | POA: Diagnosis not present

## 2017-08-01 DIAGNOSIS — F1120 Opioid dependence, uncomplicated: Secondary | ICD-10-CM | POA: Diagnosis not present

## 2017-08-01 DIAGNOSIS — G894 Chronic pain syndrome: Secondary | ICD-10-CM | POA: Diagnosis not present

## 2017-08-01 DIAGNOSIS — M5136 Other intervertebral disc degeneration, lumbar region: Secondary | ICD-10-CM | POA: Diagnosis not present

## 2017-08-03 DIAGNOSIS — G4733 Obstructive sleep apnea (adult) (pediatric): Secondary | ICD-10-CM | POA: Diagnosis not present

## 2017-08-04 DIAGNOSIS — F419 Anxiety disorder, unspecified: Secondary | ICD-10-CM | POA: Diagnosis not present

## 2017-08-04 DIAGNOSIS — Z79899 Other long term (current) drug therapy: Secondary | ICD-10-CM | POA: Diagnosis not present

## 2017-08-04 DIAGNOSIS — R52 Pain, unspecified: Secondary | ICD-10-CM | POA: Diagnosis not present

## 2017-08-04 DIAGNOSIS — I10 Essential (primary) hypertension: Secondary | ICD-10-CM | POA: Diagnosis not present

## 2017-08-04 DIAGNOSIS — E785 Hyperlipidemia, unspecified: Secondary | ICD-10-CM | POA: Diagnosis not present

## 2017-08-04 DIAGNOSIS — G4700 Insomnia, unspecified: Secondary | ICD-10-CM | POA: Diagnosis not present

## 2019-02-18 DIAGNOSIS — R001 Bradycardia, unspecified: Secondary | ICD-10-CM | POA: Insufficient documentation

## 2019-03-01 ENCOUNTER — Emergency Department (HOSPITAL_COMMUNITY)
Admission: EM | Admit: 2019-03-01 | Discharge: 2019-03-01 | Disposition: A | Discharge status: Home / Self Care | Payer: 59 | Attending: Emergency Medicine | Admitting: Emergency Medicine

## 2019-03-01 ENCOUNTER — Encounter (HOSPITAL_COMMUNITY): Payer: Self-pay | Admitting: Emergency Medicine

## 2019-03-01 ENCOUNTER — Emergency Department (HOSPITAL_COMMUNITY): Payer: 59

## 2019-03-01 DIAGNOSIS — J988 Other specified respiratory disorders: Secondary | ICD-10-CM | POA: Diagnosis not present

## 2019-03-01 DIAGNOSIS — Z7982 Long term (current) use of aspirin: Secondary | ICD-10-CM | POA: Diagnosis not present

## 2019-03-01 DIAGNOSIS — R0602 Shortness of breath: Secondary | ICD-10-CM | POA: Diagnosis present

## 2019-03-01 DIAGNOSIS — F17200 Nicotine dependence, unspecified, uncomplicated: Secondary | ICD-10-CM | POA: Diagnosis not present

## 2019-03-01 DIAGNOSIS — Z79899 Other long term (current) drug therapy: Secondary | ICD-10-CM | POA: Insufficient documentation

## 2019-03-01 DIAGNOSIS — I10 Essential (primary) hypertension: Secondary | ICD-10-CM | POA: Insufficient documentation

## 2019-03-01 DIAGNOSIS — J45909 Unspecified asthma, uncomplicated: Secondary | ICD-10-CM | POA: Diagnosis not present

## 2019-03-01 DIAGNOSIS — U071 COVID-19: Secondary | ICD-10-CM | POA: Diagnosis not present

## 2019-03-01 LAB — CBC WITH DIFFERENTIAL/PLATELET
Abs Immature Granulocytes: 0.01 10*3/uL (ref 0.00–0.07)
Basophils Absolute: 0 10*3/uL (ref 0.0–0.1)
Basophils Relative: 1 %
Eosinophils Absolute: 0 10*3/uL (ref 0.0–0.5)
Eosinophils Relative: 1 %
HCT: 46.7 % (ref 39.0–52.0)
Hemoglobin: 16 g/dL (ref 13.0–17.0)
Immature Granulocytes: 0 %
Lymphocytes Relative: 24 %
Lymphs Abs: 0.8 10*3/uL (ref 0.7–4.0)
MCH: 31.2 pg (ref 26.0–34.0)
MCHC: 34.3 g/dL (ref 30.0–36.0)
MCV: 91 fL (ref 80.0–100.0)
Monocytes Absolute: 0.7 10*3/uL (ref 0.1–1.0)
Monocytes Relative: 21 %
Neutro Abs: 1.9 10*3/uL (ref 1.7–7.7)
Neutrophils Relative %: 53 %
Platelets: 238 10*3/uL (ref 150–400)
RBC: 5.13 MIL/uL (ref 4.22–5.81)
RDW: 11.8 % (ref 11.5–15.5)
WBC: 3.5 10*3/uL — ABNORMAL LOW (ref 4.0–10.5)
nRBC: 0 % (ref 0.0–0.2)

## 2019-03-01 LAB — BASIC METABOLIC PANEL
Anion gap: 11 (ref 5–15)
BUN: <5 mg/dL — ABNORMAL LOW (ref 8–23)
CO2: 27 mmol/L (ref 22–32)
Calcium: 9.8 mg/dL (ref 8.9–10.3)
Chloride: 99 mmol/L (ref 98–111)
Creatinine, Ser: 0.7 mg/dL (ref 0.61–1.24)
GFR calc Af Amer: >60 mL/min (ref >60)
GFR calc non Af Amer: >60 mL/min (ref >60)
Glucose, Bld: 117 mg/dL — ABNORMAL HIGH (ref 70–99)
Potassium: 3.4 mmol/L — ABNORMAL LOW (ref 3.5–5.1)
Sodium: 137 mmol/L (ref 135–145)

## 2019-03-01 MED ORDER — ALBUTEROL SULFATE HFA 108 (90 BASE) MCG/ACT IN AERS
2.0000 | INHALATION_SPRAY | Freq: Once | RESPIRATORY_TRACT | Status: AC
  Administered 2019-03-01: 2 via RESPIRATORY_TRACT
  Filled 2019-03-01: qty 6.7

## 2019-03-01 MED ORDER — SODIUM CHLORIDE 0.9 % IV BOLUS
1000.0000 mL | Freq: Once | INTRAVENOUS | Status: AC
  Administered 2019-03-01: 1000 mL via INTRAVENOUS

## 2019-03-01 MED ORDER — ONDANSETRON HCL 4 MG/2ML IJ SOLN
4.0000 mg | Freq: Once | INTRAMUSCULAR | Status: AC
  Administered 2019-03-01: 4 mg via INTRAVENOUS
  Filled 2019-03-01: qty 2

## 2019-03-01 NOTE — ED Triage Notes (Signed)
Pt arrives via em[facility] form home was diagnosed with covid last Friday but began to have cp last night. Pt has felt sob but has room air sat's 95%

## 2019-03-01 NOTE — ED Notes (Signed)
Pt ambulatory independently, SpO2 stayed between 97% and 98% on room air. Pt reported no worsening SOB after ambulating.

## 2019-03-01 NOTE — Discharge Instructions (Addendum)
You have been diagnosed with COVID-19 infection.  Use albuterol inhaler 2 puffs every 4 hours as needed for shortness of breath.  Take Tylenol at home as needed for aches and pain.  Stay hydrated.  Use pulse oximetry at home to monitor your oxygen level and if it is below 90 and you are having shortness of breath, do not hesitate to return for further evaluation.

## 2019-03-01 NOTE — ED Provider Notes (Signed)
[facility] EMERGENCY DEPARTMENT Provider Note   CSN: [id number] Arrival date & time: 03/01/19  1056     History Chief Complaint  Patient presents with  . Chest Pain    Adrian Bird is a 64 y.o. male.  The history is provided by the patient and medical records. No language interpreter was used.     64 year old male with history of asthma, hypertension, epilepsy's, GERD brought here via EMS from home for evaluation of shortness of breath.  Patient report for the past 8 days he has been having Covid symptoms which include subjective fever, chills, generalized weakness, nausea without vomiting, loose stools, nonproductive cough, shortness of breath, pain in the chest, and overall not feeling well.  States he tested positive for COVID-19 7 days prior.  Furthermore, states that wife also was sick with COVID-19 requiring hospitalization.  He endorsed some mild wheezing, and due to increase shortness of breath, decided to come to ER for further evaluation.  He admits to vaping.  No prior history of PE or DVT.  Past Medical History:  Diagnosis Date  . Acid reflux   . Asthma   . Depression   . Epilepsy (HCC)   . High blood pressure   . Urticaria     There are no problems to display for this patient.   Past Surgical History:  Procedure Laterality Date  . CYST REMOVAL NECK    . CYST REMOVAL TRUNK    . EYE SURGERY Bilateral   . HERNIA REPAIR    . KNEE SURGERY Left   . TONSILLECTOMY         Family History  Problem Relation Age of Onset  . Congestive Heart Failure Mother   . Lung cancer Father   . Diabetes Sister   . Diabetes Brother   . Diabetes Maternal Aunt   . Lung cancer Maternal Grandfather     Social History   Tobacco Use  . Smoking status: Current Every Day Smoker    Types: E-cigarettes  . Smokeless tobacco: Never Used  Substance Use Topics  . Alcohol use: No  . Drug use: No    Home Medications Prior to Admission medications     Medication Sig Start Date End Date Taking? Authorizing Provider  aspirin EC 81 MG tablet 81 mg.    [provider]  atorvastatin (LIPITOR) 40 MG tablet  02/08/16   [provider]  carbamazepine (TEGRETOL) 200 MG tablet  03/07/16   [provider]  cetirizine (ZYRTEC) 10 MG tablet Take one tablet by mouth once daily.  Can use two times daily if needed. 03/24/16   Kozlow, Alvira Philips, MD  esomeprazole (NEXIUM) 20 MG capsule Take 20 mg by mouth daily. 03/10/16   [provider]  ezetimibe (ZETIA) 10 MG tablet Take 10 mg by mouth at bedtime.    [provider]  FLUoxetine (PROZAC) 20 MG capsule  03/21/16   [provider]  Fluticasone-Umeclidin-Vilant (TRELEGY ELLIPTA) 100-62.5-25 MCG/INH AEPB Inhale 1 Dose into the lungs daily. Rinse, gargle, and spit after use. 04/07/16   Kozlow, Alvira Philips, MD  HYDROcodone-acetaminophen [facility]) 10-325 MG tablet  03/24/16   [provider]  Karlene Einstein 290 MCG CAPS capsule  03/07/16   [provider]  lisinopril (PRINIVIL,ZESTRIL) 20 MG tablet Take 20 mg by mouth daily. 02/08/16   [provider]  meloxicam (MOBIC) 7.5 MG tablet Take 7.5 mg by mouth 2 (two) times daily. 03/07/16   [provider]  mirtazapine (REMERON) 30 MG tablet 30 mg.    [provider]  montelukast (SINGULAIR) 10 MG tablet  02/08/16   [provider]  ranitidine (ZANTAC) 300 MG capsule  03/07/16   [provider]  tiZANidine (ZANAFLEX) 4 MG tablet Take 4 mg by mouth.    [provider]  VENTOLIN HFA 108 [phone] Base) MCG/ACT inhaler  03/10/16   [provider]  VOLTAREN 1 % GEL  03/15/16   [provider]    Allergies    Cephalexin, Doxycycline, Penicillins, and Sulfa antibiotics  Review of Systems   Review of Systems  All other systems reviewed and are negative.   Physical Exam Updated Vital Signs BP (!) 151/83   Pulse (!) 55   Temp 98.6 F (37 C) (Oral)   Resp 15    SpO2 96%   Physical Exam Vitals and nursing note reviewed.  Constitutional:      General: He is not in acute distress.    Appearance: He is well-developed.     Comments: Patient is well-appearing in no acute respiratory discomfort  HENT:     Head: Atraumatic.  Eyes:     Conjunctiva/sclera: Conjunctivae normal.  Cardiovascular:     Rate and Rhythm: Normal rate and regular rhythm.     Pulses: Normal pulses.     Heart sounds: Normal heart sounds.  Pulmonary:     Effort: Pulmonary effort is normal.     Breath sounds: Normal breath sounds. No wheezing, rhonchi or rales.  Abdominal:     Palpations: Abdomen is soft.     Tenderness: There is no abdominal tenderness.  Musculoskeletal:        General: No swelling.     Cervical back: Neck supple.  Skin:    Findings: No rash.  Neurological:     Mental Status: He is alert and oriented to person, place, and time.  Psychiatric:        Mood and Affect: Mood normal.     ED Results / Procedures / Treatments   Labs (all labs ordered are listed, but only abnormal results are displayed) Labs Reviewed  BASIC METABOLIC PANEL - Abnormal; Notable for the following components:      Result Value   Potassium 3.4 (*)    Glucose, Bld 117 (*)    BUN <5 (*)    All other components within normal limits  CBC WITH DIFFERENTIAL/PLATELET - Abnormal; Notable for the following components:   WBC 3.5 (*)    All other components within normal limits    EKG EKG Interpretation  Date/Time:  Friday March 01 2019 11:07:56 EST Ventricular Rate:  65 PR Interval:    QRS Duration: 97 QT Interval:  411 QTC Calculation: 428 R Axis:   65 Text Interpretation: Sinus rhythm RSR' in V1 or V2, probably normal variant no acute ST/T changes No old tracing to compare Confirmed by Pricilla Loveless [phone]) on 03/01/2019 11:13:16 AM   Radiology DG Chest Port 1 View  Result Date: 03/01/2019 CLINICAL DATA:  Shortness of breath, COVID-19 positive. EXAM: PORTABLE CHEST  1 VIEW COMPARISON:  February 14, 2019. FINDINGS: The heart size and mediastinal contours are within normal limits. Both lungs are clear. No pneumothorax or pleural effusion is noted. The visualized skeletal structures are unremarkable. IMPRESSION: No active disease. Electronically Signed   By: Lupita Raider M.D.   On: 03/01/2019 11:39    Procedures Procedures (including critical care time)  Medications Ordered in ED Medications  albuterol (  VENTOLIN HFA) 108 (90 Base) MCG/ACT inhaler 2 puff (2 puffs Inhalation Given 03/01/19 1146)  sodium chloride 0.9 % bolus 1,000 mL (1,000 mLs Intravenous New Bag/Given 03/01/19 1146)  ondansetron (ZOFRAN) injection 4 mg (4 mg Intravenous Given 03/01/19 1147)    ED Course  I have reviewed the triage vital signs and the nursing notes.  Pertinent labs & imaging results that were available during my care of the patient were reviewed by me and considered in my medical decision making (see chart for details).    MDM Rules/Calculators/A&P                      BP (!) 151/83   Pulse (!) 55   Temp 98.6 F (37 C) (Oral)   Resp 15   SpO2 96%   Final Clinical Impression(s) / ED Diagnoses Final diagnoses:  Respiratory tract infection due to COVID-19 virus    Rx / DC Orders ED Discharge Orders    None     11:17 AM Patient reportedly tested positive for COVID-19 from an outside facility here with COVID-19 symptoms which includes increase shortness of breath.  He also endorsed some nausea without vomiting but does have some loose stools.  Endorsed loss of taste and smell as well.  Patient overall well-appearing, no hypoxia, afebrile, no hypotension on exam.  Lungs are clear to auscultation.  At this time no obvious finding to suggest hospital admission.  Work-up initiated.  1:19 PM Fortunately labs are reassuring, chest x-ray unremarkable, no hypoxia with ambulation, patient is well-appearing.  At this time he does not meet requirement for admission.  Patient  discharged home with albuterol inhaler to use as needed.  Appropriate care instruction per CDC guideline was discussed.  Return precautions discussed.  Adrian Bird was evaluated in Emergency Department on 03/01/2019 for the symptoms described in the history of present illness. He was evaluated in the context of the global COVID-19 pandemic, which necessitated consideration that the patient might be at risk for infection with the SARS-CoV-2 virus that causes COVID-19. Institutional protocols and algorithms that pertain to the evaluation of patients at risk for COVID-19 are in a state of rapid change based on information released by regulatory bodies including the CDC and federal and state organizations. These policies and algorithms were followed during the patient's care in the ED.    Domenic Moras, PA-C 03/01/19 [place], MD 03/04/19 [phone]

## 2019-04-30 DIAGNOSIS — I493 Ventricular premature depolarization: Secondary | ICD-10-CM | POA: Insufficient documentation

## 2019-04-30 DIAGNOSIS — Z789 Other specified health status: Secondary | ICD-10-CM | POA: Insufficient documentation

## 2019-04-30 DIAGNOSIS — I491 Atrial premature depolarization: Secondary | ICD-10-CM | POA: Insufficient documentation

## 2019-06-16 DIAGNOSIS — I10 Essential (primary) hypertension: Secondary | ICD-10-CM | POA: Diagnosis not present

## 2019-06-16 DIAGNOSIS — R079 Chest pain, unspecified: Secondary | ICD-10-CM | POA: Diagnosis not present

## 2019-06-16 DIAGNOSIS — J449 Chronic obstructive pulmonary disease, unspecified: Secondary | ICD-10-CM | POA: Diagnosis not present

## 2019-06-17 DIAGNOSIS — J449 Chronic obstructive pulmonary disease, unspecified: Secondary | ICD-10-CM | POA: Diagnosis not present

## 2019-06-17 DIAGNOSIS — I10 Essential (primary) hypertension: Secondary | ICD-10-CM | POA: Diagnosis not present

## 2019-06-17 DIAGNOSIS — R079 Chest pain, unspecified: Secondary | ICD-10-CM

## 2019-09-16 ENCOUNTER — Other Ambulatory Visit (HOSPITAL_COMMUNITY): Payer: Self-pay | Admitting: Neurology

## 2019-09-16 DIAGNOSIS — Z79891 Long term (current) use of opiate analgesic: Secondary | ICD-10-CM | POA: Insufficient documentation

## 2019-09-16 DIAGNOSIS — F331 Major depressive disorder, recurrent, moderate: Secondary | ICD-10-CM | POA: Insufficient documentation

## 2019-09-16 DIAGNOSIS — F419 Anxiety disorder, unspecified: Secondary | ICD-10-CM | POA: Insufficient documentation

## 2019-09-16 DIAGNOSIS — G8929 Other chronic pain: Secondary | ICD-10-CM | POA: Insufficient documentation

## 2019-09-16 DIAGNOSIS — M545 Low back pain, unspecified: Secondary | ICD-10-CM

## 2019-09-26 ENCOUNTER — Ambulatory Visit
Admission: EM | Admit: 2019-09-26 | Discharge: 2019-09-26 | Disposition: A | Discharge status: Home / Self Care | Payer: 59 | Attending: Emergency Medicine | Admitting: Emergency Medicine

## 2019-09-26 ENCOUNTER — Encounter: Payer: Self-pay | Admitting: Emergency Medicine

## 2019-09-26 ENCOUNTER — Other Ambulatory Visit: Payer: Self-pay

## 2019-09-26 DIAGNOSIS — R0981 Nasal congestion: Secondary | ICD-10-CM

## 2019-09-26 DIAGNOSIS — J069 Acute upper respiratory infection, unspecified: Secondary | ICD-10-CM | POA: Diagnosis not present

## 2019-09-26 DIAGNOSIS — Z1152 Encounter for screening for COVID-19: Secondary | ICD-10-CM

## 2019-09-26 MED ORDER — CETIRIZINE HCL 5 MG PO CHEW: 5.0000 mg | CHEWABLE_TABLET | Freq: Every day | ORAL | 0 refills | Status: DC

## 2019-09-26 MED ORDER — BENZONATATE 100 MG PO CAPS: 100.0000 mg | ORAL_CAPSULE | Freq: Three times a day (TID) | ORAL | 0 refills | Status: DC

## 2019-09-26 MED ORDER — FLUTICASONE PROPIONATE 50 MCG/ACT NA SUSP: 1.0000 | Freq: Every day | NASAL | 0 refills | Status: DC

## 2019-09-26 MED ORDER — PREDNISONE 10 MG PO TABS: 20.0000 mg | ORAL_TABLET | Freq: Every day | ORAL | 0 refills | Status: DC

## 2019-09-26 NOTE — ED Triage Notes (Signed)
Sinus congestion, ear fullness and headache x 1 week

## 2019-09-26 NOTE — ED Provider Notes (Addendum)
[facility]   [id number] 09/26/19 Arrival Time: 1137   CC: COVID symptoms  SUBJECTIVE: History from: patient.  Adrian Bird is a 64 y.o. male who presents to the urgent care with a complaint of cough, nasal congestion, ear fullness and postnasal drip for the past 1 week.  Denies sick exposure to COVID, flu or strep.  Denies recent travel.  Has not tried any OTC medication.  Denies aggravating factors.  Denies previous symptoms in the past.   Denies fever, chills, fatigue, sinus pain, rhinorrhea, sore throat, SOB, wheezing, chest pain, nausea, changes in bowel or bladder habits.     ROS: As per HPI.  All other pertinent ROS negative.     Past Medical History:  Diagnosis Date  . Acid reflux   . Asthma   . Depression   . Epilepsy (HCC)   . High blood pressure   . Urticaria    Past Surgical History:  Procedure Laterality Date  . CYST REMOVAL NECK    . CYST REMOVAL TRUNK    . EYE SURGERY Bilateral   . HERNIA REPAIR    . KNEE SURGERY Left   . TONSILLECTOMY     Allergies  Allergen Reactions  . Cephalexin Hives  . Doxycycline Hives  . Penicillins Hives  . Sulfa Antibiotics Hives   No current facility-administered medications on file prior to encounter.   Current Outpatient Medications on File Prior to Encounter  Medication Sig Dispense Refill  . aspirin EC 81 MG tablet 81 mg.    . atorvastatin (LIPITOR) 40 MG tablet     . carbamazepine (TEGRETOL) 200 MG tablet     . esomeprazole (NEXIUM) 20 MG capsule Take 20 mg by mouth daily.    Marland Kitchen ezetimibe (ZETIA) 10 MG tablet Take 10 mg by mouth at bedtime.    Marland Kitchen FLUoxetine (PROZAC) 20 MG capsule     . Fluticasone-Umeclidin-Vilant (TRELEGY ELLIPTA) 100-62.5-25 MCG/INH AEPB Inhale 1 Dose into the lungs daily. Rinse, gargle, and spit after use. 60 each 5  . HYDROcodone-acetaminophen (NORCO) 10-325 MG tablet     . LINZESS 290 MCG CAPS capsule     . lisinopril (PRINIVIL,ZESTRIL) 20 MG tablet Take 20 mg by mouth daily.     . meloxicam (MOBIC) 7.5 MG tablet Take 7.5 mg by mouth 2 (two) times daily.    . mirtazapine (REMERON) 30 MG tablet 30 mg.    . montelukast (SINGULAIR) 10 MG tablet     . ranitidine (ZANTAC) 300 MG capsule     . tiZANidine (ZANAFLEX) 4 MG tablet Take 4 mg by mouth.    . VENTOLIN HFA 108 (90 Base) MCG/ACT inhaler     . VOLTAREN 1 % GEL      Social History   Socioeconomic History  . Marital status: Married    Spouse name: Not on file  . Number of children: Not on file  . Years of education: Not on file  . Highest education level: Not on file  Occupational History  . Not on file  Tobacco Use  . Smoking status: Current Every Day Smoker    Types: E-cigarettes  . Smokeless tobacco: Never Used  Substance and Sexual Activity  . Alcohol use: No  . Drug use: No  . Sexual activity: Not on file  Other Topics Concern  . Not on file  Social History Narrative  . Not on file   Social Determinants of Health   Financial Resource Strain:   . Difficulty of Paying  Living Expenses: Not on file  Food Insecurity:   . Worried About Programme researcher, broadcasting/film/video in the Last Year: Not on file  . Ran Out of Food in the Last Year: Not on file  Transportation Needs:   . Lack of Transportation (Medical): Not on file  . Lack of Transportation (Non-Medical): Not on file  Physical Activity:   . Days of Exercise per Week: Not on file  . Minutes of Exercise per Session: Not on file  Stress:   . Feeling of Stress : Not on file  Social Connections:   . Frequency of Communication with Friends and Family: Not on file  . Frequency of Social Gatherings with Friends and Family: Not on file  . Attends Religious Services: Not on file  . Active Member of Clubs or Organizations: Not on file  . Attends Banker Meetings: Not on file  . Marital Status: Not on file  Intimate Partner Violence:   . Fear of Current or Ex-Partner: Not on file  . Emotionally Abused: Not on file  . Physically Abused: Not on  file  . Sexually Abused: Not on file   Family History  Problem Relation Age of Onset  . Congestive Heart Failure Mother   . Lung cancer Father   . Diabetes Sister   . Diabetes Brother   . Diabetes Maternal Aunt   . Lung cancer Maternal Grandfather     OBJECTIVE:  Vitals:   09/26/19 1146  BP: (!) 159/87  Pulse: 64  Resp: 16  Temp: 99 F (37.2 C)  TempSrc: Oral  SpO2: 94%     General appearance: alert; appears fatigued, but nontoxic; speaking in full sentences and tolerating own secretions HEENT: NCAT; Ears: EACs clear, TMs pearly gray; Eyes: PERRL.  EOM grossly intact. Sinuses: nontender; Nose: nares patent without rhinorrhea, Throat: oropharynx clear, tonsils non erythematous or enlarged, uvula midline  Neck: supple without LAD Lungs: unlabored respirations, symmetrical air entry; cough: mild; no respiratory distress; CTAB Heart: regular rate and rhythm.  Radial pulses 2+ symmetrical bilaterally Skin: warm and dry Psychological: alert and cooperative; normal mood and affect  LABS:  No results found for this or any previous visit (from the past 24 hour(s)).   ASSESSMENT & PLAN:  1. Viral URI with cough   2. Encounter for screening for COVID-19     Meds ordered this encounter  Medications  . fluticasone (FLONASE) 50 MCG/ACT nasal spray    Sig: Place 1 spray into both nostrils daily for 14 days.    Dispense:  16 g    Refill:  0  . cetirizine (ZYRTEC) 5 MG chewable tablet    Sig: Chew 1 tablet (5 mg total) by mouth daily.    Dispense:  30 tablet    Refill:  0  . benzonatate (TESSALON) 100 MG capsule    Sig: Take 1 capsule (100 mg total) by mouth every 8 (eight) hours.    Dispense:  30 capsule    Refill:  0  . predniSONE (DELTASONE) 10 MG tablet    Sig: Take 2 tablets (20 mg total) by mouth daily.    Dispense:  15 tablet    Refill:  0    Discharge Instructions.Marland Kitchen    COVID testing ordered.  It will take between 2-7 days for test results.  Someone will  contact you regarding abnormal results.    In the meantime: You should remain isolated in your home for 10 days from symptom onset AND greater  than 24 hours after symptoms resolution (absence of fever without the use of fever-reducing medication and improvement in respiratory symptoms), whichever is longer Get plenty of rest and push fluids Tessalon Perles prescribed for cough Zyrtec for nasal congestion, runny nose, and/or sore throat Flonase for nasal congestion and runny nose Low-dose prednisone was prescribed Use medications daily for symptom relief Use OTC medications like ibuprofen or tylenol as needed fever or pain Call or go to the ED if you have any new or worsening symptoms such as fever, worsening cough, shortness of breath, chest tightness, chest pain, turning blue, changes in mental status, etc...   Reviewed expectations re: course of current medical issues. Questions answered. Outlined signs and symptoms indicating need for more acute intervention. Patient verbalized understanding. After Visit Summary given.      Note: This document was prepared using Dragon voice recognition software and may include unintentional dictation errors.    Durward Parcel, FNP 09/26/19 1238    Durward Parcel, FNP 09/26/19 1238

## 2019-09-26 NOTE — Discharge Instructions (Addendum)
COVID testing ordered.  It will take between 2-7 days for test results.  Someone will contact you regarding abnormal results.    In the meantime: You should remain isolated in your home for 10 days from symptom onset AND greater than 24 hours after symptoms resolution (absence of fever without the use of fever-reducing medication and improvement in respiratory symptoms), whichever is longer Get plenty of rest and push fluids Tessalon Perles prescribed for cough Zyrtec for nasal congestion, runny nose, and/or sore throat Flonase for nasal congestion and runny nose Low-dose prednisone was prescribed Use medications daily for symptom relief Use OTC medications like ibuprofen or tylenol as needed fever or pain Call or go to the ED if you have any new or worsening symptoms such as fever, worsening cough, shortness of breath, chest tightness, chest pain, turning blue, changes in mental status, etc...  

## 2019-09-27 LAB — NOVEL CORONAVIRUS, NAA: SARS-CoV-2, NAA: NOT DETECTED

## 2019-09-27 LAB — SARS-COV-2, NAA 2 DAY TAT

## 2019-10-15 ENCOUNTER — Ambulatory Visit (HOSPITAL_COMMUNITY)
Admission: RE | Admit: 2019-10-15 | Discharge: 2019-10-15 | Disposition: A | Discharge status: Home / Self Care | Payer: 59 | Source: Ambulatory Visit | Attending: Neurology | Admitting: Neurology

## 2019-10-15 ENCOUNTER — Other Ambulatory Visit: Payer: Self-pay

## 2019-10-15 DIAGNOSIS — M545 Low back pain, unspecified: Secondary | ICD-10-CM

## 2020-08-20 ENCOUNTER — Encounter (INDEPENDENT_AMBULATORY_CARE_PROVIDER_SITE_OTHER): Payer: Self-pay | Admitting: *Deleted

## 2020-10-15 DIAGNOSIS — G250 Essential tremor: Secondary | ICD-10-CM | POA: Insufficient documentation

## 2020-10-16 ENCOUNTER — Telehealth: Payer: Self-pay | Admitting: *Deleted

## 2020-10-16 NOTE — Telephone Encounter (Signed)
Lm for patient.   Spoke to [place] with [facility] and requested for patient to be added to [facility]. Beth stated that patient would be added into airview soon.   Routing to [employer] as an [employer]

## 2020-10-16 NOTE — Telephone Encounter (Signed)
Patient is now in [place].

## 2020-10-16 NOTE — Telephone Encounter (Signed)
ATC patient to find out what DME company he uses so we can get a download or to ask him to bring his CPAP machine with him.  No answer, left vm to return call.

## 2020-10-19 ENCOUNTER — Institutional Professional Consult (permissible substitution): Payer: 59 | Admitting: Pulmonary Disease

## 2020-10-21 NOTE — Telephone Encounter (Signed)
Able to view compliance in airview.  Patient scheduled to Dr. Vassie Loll in [place] in November.  Nothing further needed.

## 2020-11-03 ENCOUNTER — Ambulatory Visit: Payer: 59 | Admitting: Urology

## 2020-11-14 IMAGING — DX DG LUMBAR SPINE COMPLETE 4+V
5 series · 5 of 5 positions shown · non-contrast
Comparison: 04/02/2018

CLINICAL DATA: Low back pain

EXAM:
LUMBAR SPINE - COMPLETE 4+ VIEW

[l-spine ap]
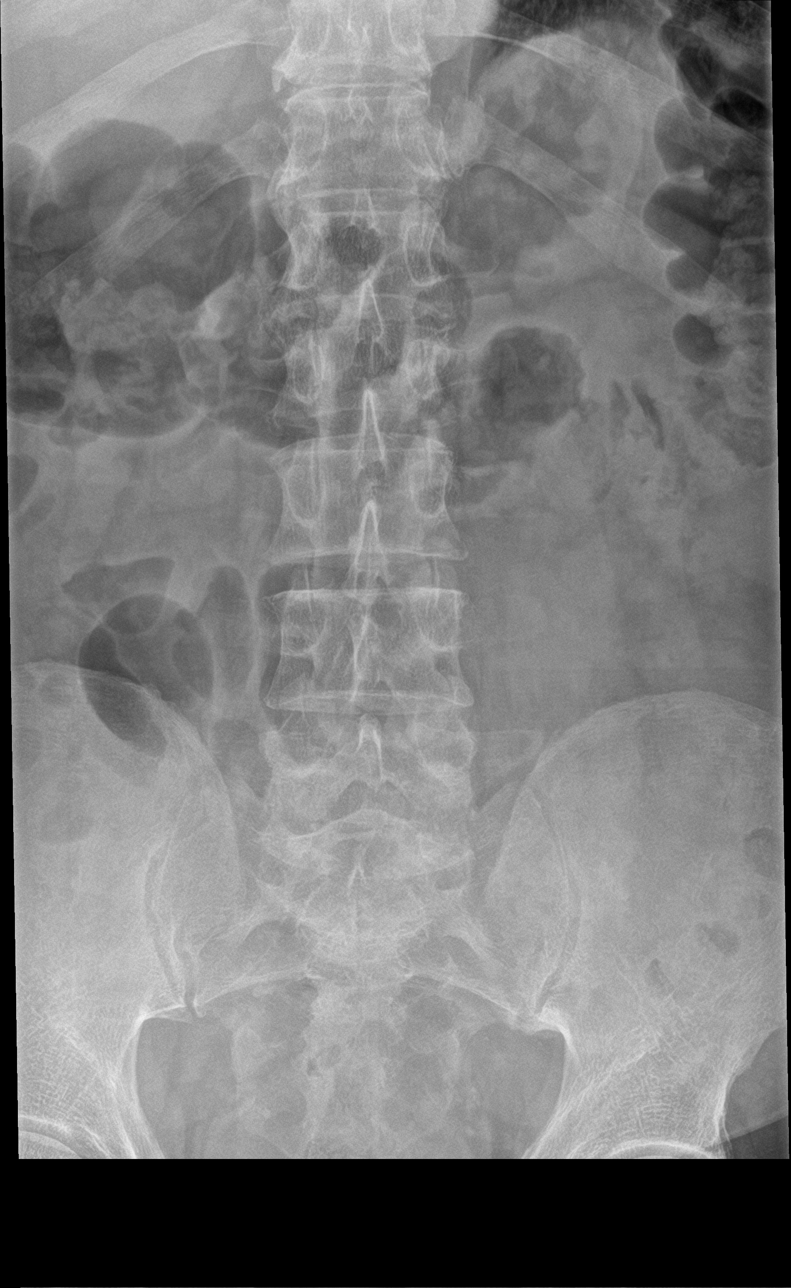

[l-spine obl (1 of 2)]
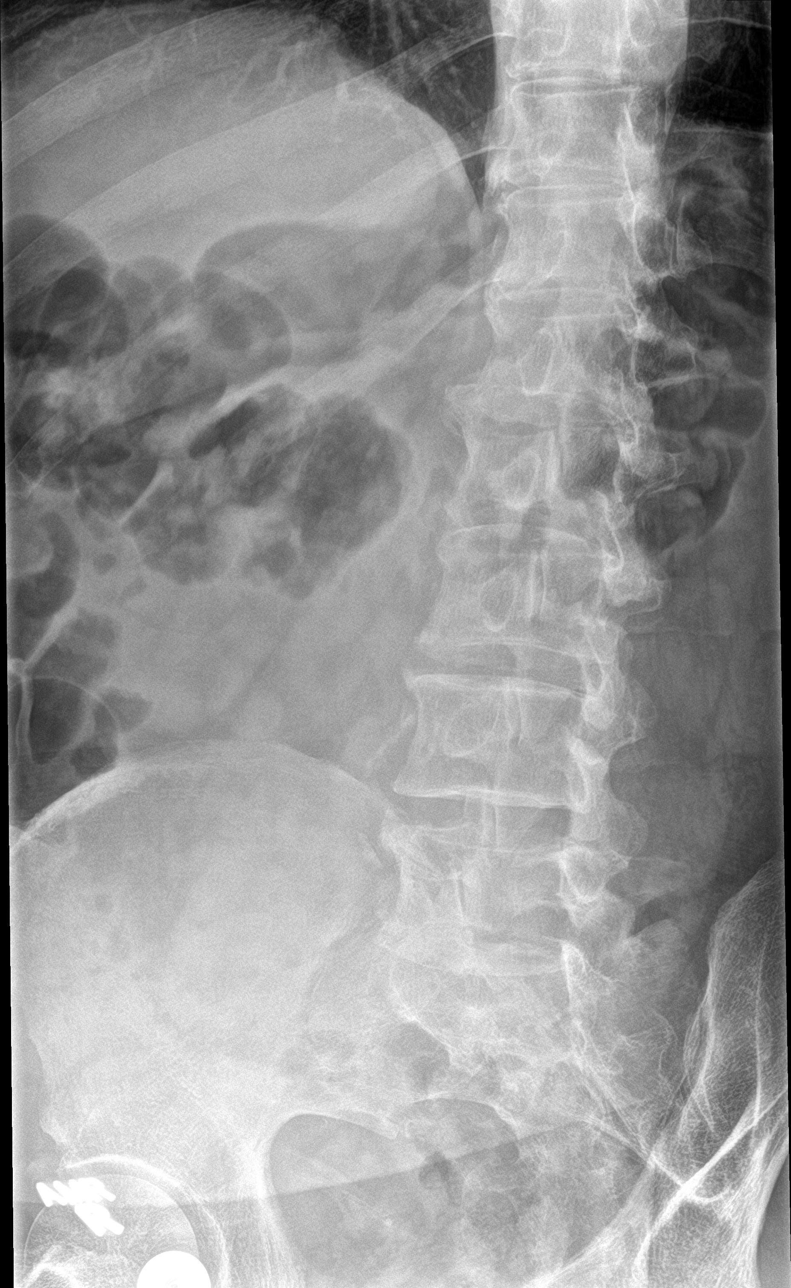

[l-spine obl (2 of 2)]
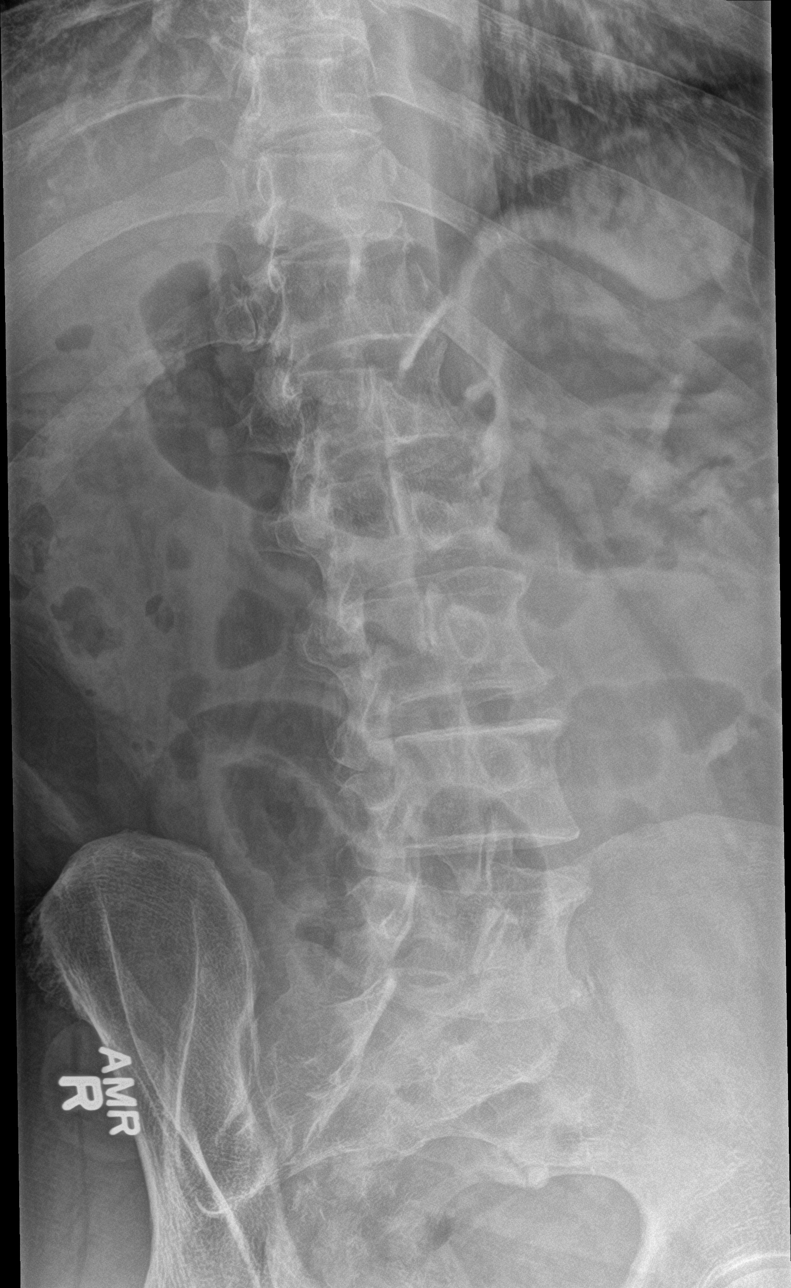

[l-spine lat]
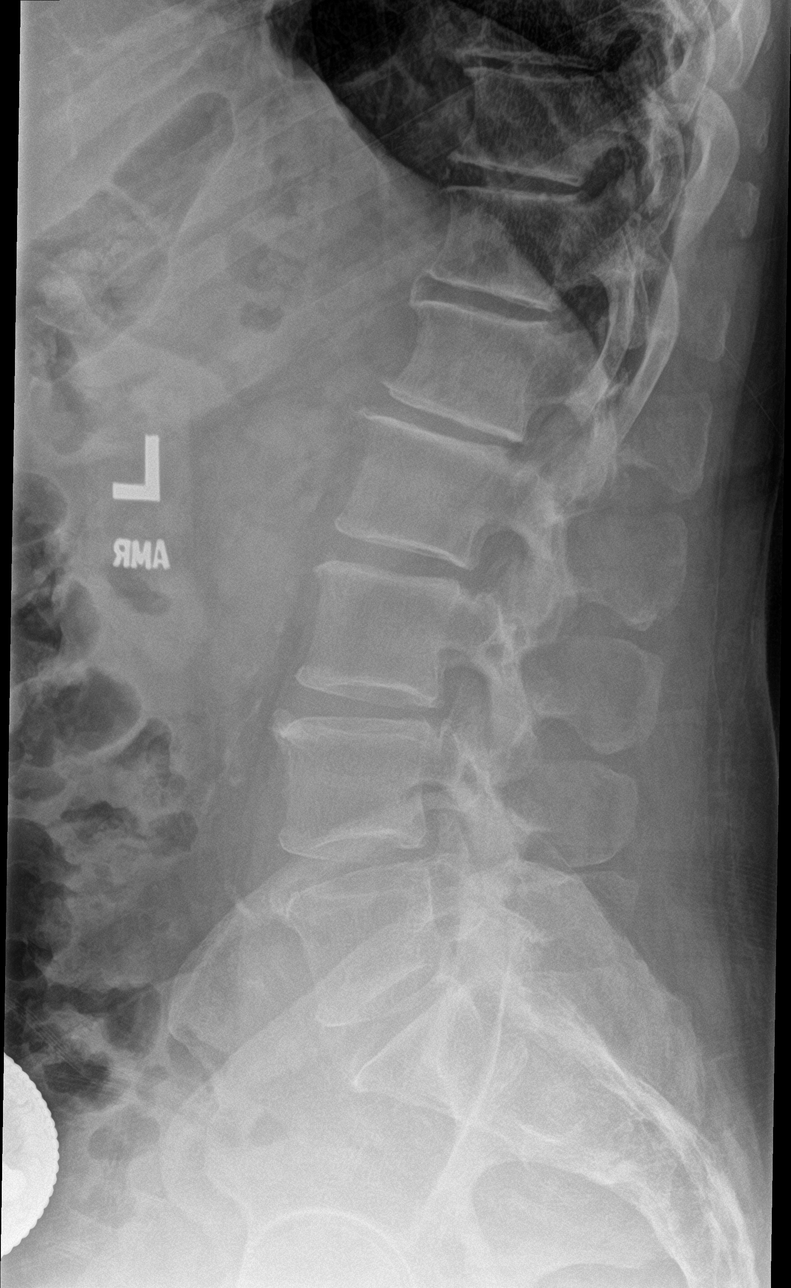

[l-spine spot]
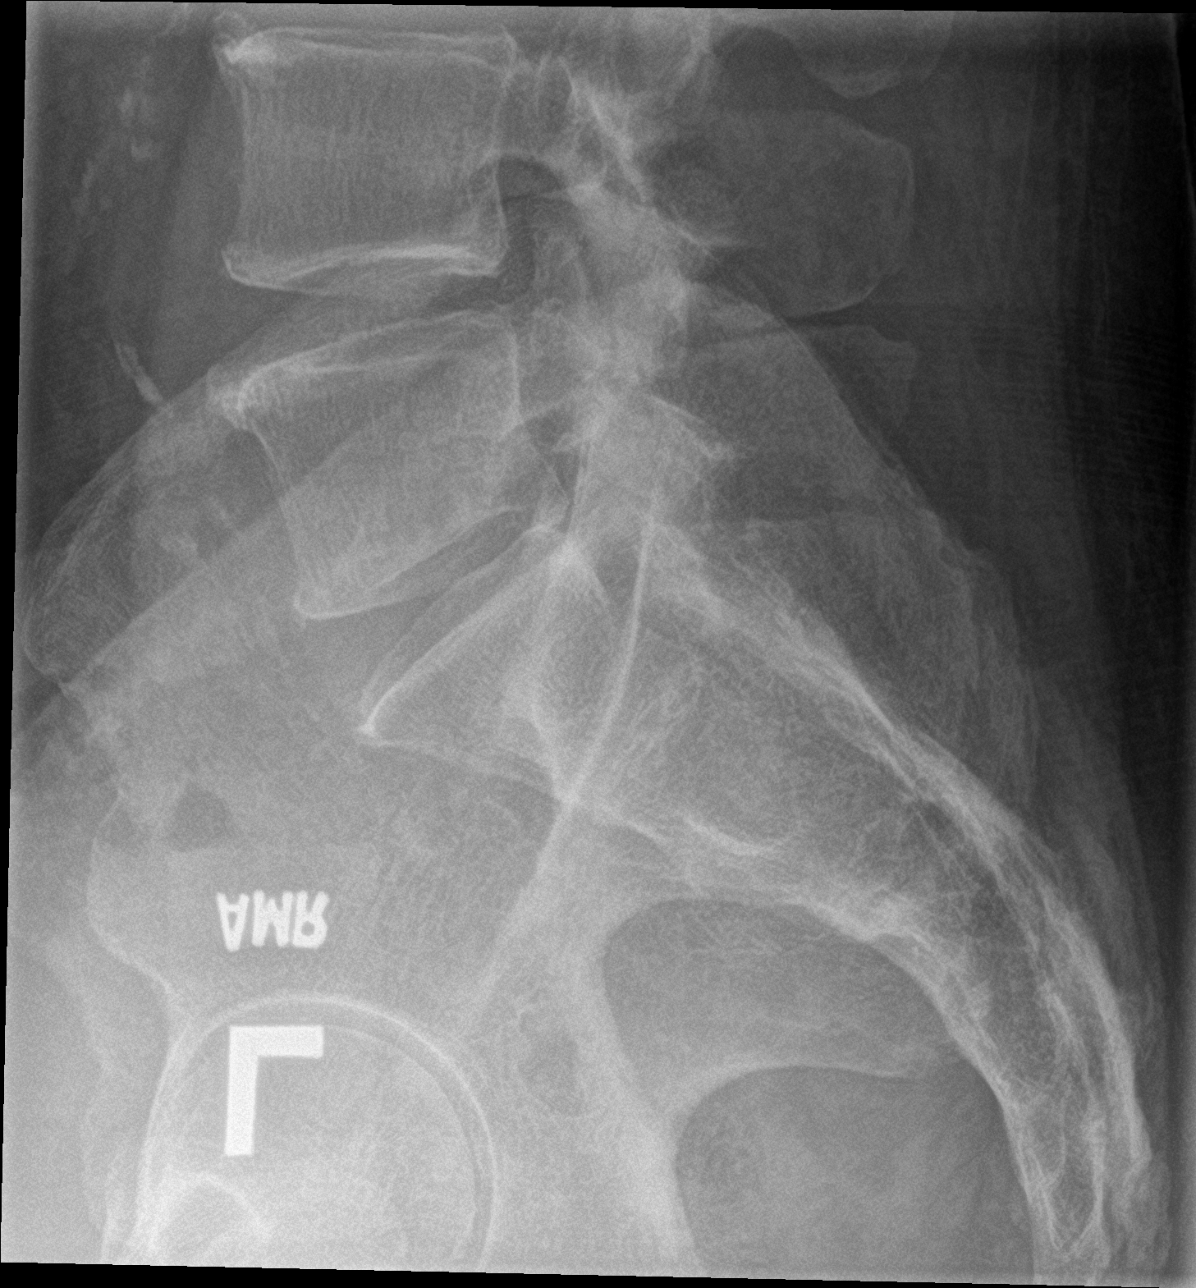

[5 of 5 positions shown; findings below may reference images not displayed]

FINDINGS: Degenerative facet disease at L4-5 and L5-S1. Disc space narrowing
and spurring at L1-2. Otherwise disc spaces are maintained. Early
anterior spurring. Normal alignment. No fracture. SI joints
symmetric and unremarkable.
IMPRESSION: Degenerative changes in the lumbar spine as above. No acute bony
abnormality.

## 2020-12-15 ENCOUNTER — Ambulatory Visit (INDEPENDENT_AMBULATORY_CARE_PROVIDER_SITE_OTHER): Payer: Medicare Other | Admitting: Pulmonary Disease

## 2020-12-15 ENCOUNTER — Other Ambulatory Visit: Payer: Self-pay

## 2020-12-15 ENCOUNTER — Encounter: Payer: Self-pay | Admitting: Pulmonary Disease

## 2020-12-15 DIAGNOSIS — J449 Chronic obstructive pulmonary disease, unspecified: Secondary | ICD-10-CM

## 2020-12-15 DIAGNOSIS — G4733 Obstructive sleep apnea (adult) (pediatric): Secondary | ICD-10-CM | POA: Diagnosis not present

## 2020-12-15 DIAGNOSIS — Z9989 Dependence on other enabling machines and devices: Secondary | ICD-10-CM | POA: Diagnosis not present

## 2020-12-15 DIAGNOSIS — Z720 Tobacco use: Secondary | ICD-10-CM

## 2020-12-15 NOTE — Assessment & Plan Note (Addendum)
I reviewed CPAP download, his machine seems to be set at 14 cm and seems to be adequately controlling events, he does have a large leak from his fullface mask and he is very compliant more than 10 hours every night.  As such hypersomnolence does not seem to be related to residual OSA but may be related to his medications for pain and seizure disorder.  Regarding his current complaints, will ask DME to get him a new humidifier and perhaps try him on a nasal mask to see if he is more comfortable on this.  We will also have them decrease his ramp time to 5 to 10 minutes at his request  We will try to obtain his baseline sleep studies from [place] or from his DME

## 2020-12-15 NOTE — Progress Notes (Signed)
Subjective:    Patient ID: Adrian Bird, male    DOB: 09-27-1955, 65 y.o.   MRN: [id number]  HPI  Chief Complaint  Patient presents with   Consult    OSA on cpap. Feels machine is not working properly  To note: Unsure of what medications he is taking currently.     Adrian Bird is a 65 year old 1 pack/day smoker presents to establish care for OSA and "asthma".  PMH -hypertension, diabetes, seizure disorder, chronic headaches, chronic back pain on narcotics  He underwent sleep study in [place] about 10 years ago and has been on CPAP since then.  He is on his third machine.  He obtained his current CPAP 4 years ago from [facility] patient in [place] but that has since moved to [place] and he is obtaining his fullface mask and other supplies from [place].  His current issues with his machine are that his humidifier does not work, and the machine takes a long time to increase the pressure.  I was able to obtain a download from machine which shows that machine is set at 14 cm, baseline sleep studies are not available to me today. Epworth sleepiness score is 13 and he reports sleepiness while sitting and reading, watching TV, lying down to rest in the afternoons or sitting quietly after lunch. He is disabled and is worked in a Equities trader and as a Engineer, maintenance (IT)  Bedtime is around 10 PM, sleep latency can be up to 20 minutes when he takes medications, reports 1-2 nocturnal awakenings and is out of bed by 7:30 AM feeling rested but with occasional headaches. Weight has decreased 30 pounds over the last 10 years There is no history suggestive of cataplexy, sleep paralysis or parasomnias Regarding his breathing, he did not bring his medications with him today but he is able to point out that he is on Anoro and perhaps Flovent MDI with ProAir for rescue   Significant tests/ events reviewed  Spirometry 04/2016 ratio 73, FEV1 81%, FVC 85%  Past Medical History:  Diagnosis Date   Acid reflux     Asthma    Depression    Epilepsy ([place])    High blood pressure    Urticaria     Past Surgical History:  Procedure Laterality Date   CYST REMOVAL NECK     CYST REMOVAL TRUNK     EYE SURGERY Bilateral    HERNIA REPAIR     KNEE SURGERY Left    TONSILLECTOMY      Allergies  Allergen Reactions   Cephalexin Hives   Doxycycline Hives   Penicillins Hives   Sulfa Antibiotics Hives    .soc   Family History  Problem Relation Age of Onset   Congestive Heart Failure Mother    Lung cancer Father    Diabetes Sister    Diabetes Brother    Diabetes Maternal Aunt    Lung cancer Maternal Grandfather       Review of Systems  Constitutional: negative for anorexia, fevers and sweats  Eyes: negative for irritation, redness and visual disturbance  Ears, nose, mouth, throat, and face: negative for earaches, epistaxis, nasal congestion and sore throat  Respiratory: negative for cough, dyspnea on exertion, sputum and wheezing  Cardiovascular: negative for chest pain, dyspnea, lower extremity edema, orthopnea, palpitations and syncope  Gastrointestinal: negative for abdominal pain, constipation, diarrhea, melena, nausea and vomiting  Genitourinary:negative for dysuria, frequency and hematuria  Hematologic/lymphatic: negative for bleeding, easy bruising and lymphadenopathy  Musculoskeletal:negative  for arthralgias, muscle weakness and stiff joints  Neurological: negative for coordination problems, gait problems, headaches and weakness  Endocrine: negative for diabetic symptoms including polydipsia, polyuria and weight loss     Objective:   Physical Exam  Gen. Pleasant, well-nourished, in no distress, normal affect ENT - no pallor,icterus, no post nasal drip Neck: No JVD, no thyromegaly, no carotid bruits Lungs: no use of accessory muscles, no dullness to percussion, decreased breath sounds bilateral without rales or rhonchi  Cardiovascular: Rhythm regular, heart sounds  normal, no  murmurs or gallops, no peripheral edema Abdomen: soft and non-tender, no hepatosplenomegaly, BS normal. Musculoskeletal: No deformities, no cyanosis or clubbing Neuro:  alert, non focal       Assessment & Plan:

## 2020-12-15 NOTE — Assessment & Plan Note (Signed)
He has smoked a pack per day for more than 50 years, discussed smoking cessation is the most important intervention for him. He will qualify for lung cancer screening program

## 2020-12-15 NOTE — Patient Instructions (Signed)
  Obtain sleep studies from [facility] or Dr Blenda Nicely in [place] We will ask DME to check humidifier & decrease ramp time to   Schedule pFTs at [place] Lung cancer screening program

## 2020-12-15 NOTE — Assessment & Plan Note (Signed)
Reviewed last chest x-ray from care everywhere on 11/6 which does not show any infiltrates or effusions. He appears to be maintained on Anoro and Flovent.  We will reassess PFTs

## 2020-12-18 ENCOUNTER — Telehealth: Payer: Self-pay | Admitting: Pulmonary Disease

## 2020-12-18 NOTE — Telephone Encounter (Signed)
CPAP titration study 05/2011 shows 14 cm

## 2020-12-28 ENCOUNTER — Ambulatory Visit (INDEPENDENT_AMBULATORY_CARE_PROVIDER_SITE_OTHER): Payer: 59 | Admitting: Gastroenterology

## 2021-01-05 ENCOUNTER — Ambulatory Visit (INDEPENDENT_AMBULATORY_CARE_PROVIDER_SITE_OTHER): Payer: Medicare Other | Admitting: Gastroenterology

## 2021-01-05 ENCOUNTER — Encounter (INDEPENDENT_AMBULATORY_CARE_PROVIDER_SITE_OTHER): Payer: Self-pay | Admitting: Gastroenterology

## 2021-01-05 ENCOUNTER — Other Ambulatory Visit: Payer: Self-pay

## 2021-01-05 VITALS — BP 160/85 | HR 82 | Temp 98.0°F | Ht 67.0 in | Wt 179.8 lb

## 2021-01-05 DIAGNOSIS — K219 Gastro-esophageal reflux disease without esophagitis: Secondary | ICD-10-CM

## 2021-01-05 DIAGNOSIS — R110 Nausea: Secondary | ICD-10-CM

## 2021-01-05 MED ORDER — SALINE SPRAY 0.65 % NA SOLN: 1.0000 | NASAL | 0 refills | Status: DC | PRN

## 2021-01-05 MED ORDER — OMEPRAZOLE 40 MG PO CPDR: 40.0000 mg | DELAYED_RELEASE_CAPSULE | Freq: Two times a day (BID) | ORAL | 1 refills | Status: DC

## 2021-01-05 MED ORDER — SUCRALFATE 1 GM/10ML PO SUSP: 1.0000 g | Freq: Three times a day (TID) | ORAL | 0 refills | Status: DC

## 2021-01-05 NOTE — Progress Notes (Signed)
Referring Provider: Beatrix Fetters, MD Primary Care Physician:  Beatrix Fetters, MD Primary GI Physician: newly established  Chief Complaint  Patient presents with   New Patient (Initial Visit)    Patient states he is having acid reflux real bad, went to the ER last night at [facility] left without being seen c/o abdominal pain so bad it doubled him over. States he does have nausea without vomiting.    HPI:   Adrian Bird is a 65 y.o. male with past medical history of asthma, depression, epilepsy, HTN.   Patient presenting today as a new patient referred by Dr. Shellia Carwin for GERD.  Patient reports ongoing burning to mid abdomen and epigastric region with acid regurgitation. He is currently on protonix  daily and carafate 1g QID, started by PCP. Was previously on famotidine  otc, states that he stopped taking this one after he started PPI and carafate, pantoprazole and carafate tablets have provided no relief of his symptoms. Sometimes eating makes the pain better. He has nausea without vomiting. He denies any pain to upper abdomen. He reports that he has some constipation r/t chronic pain medication for his back pain. Takes OTC stool softeners and miralax. No blood in stools, melena, no early satiety. Reports that his appetite is good. Though he does report that he has lost some weight over the past year but this is due to him trying to not eat some due to abdominal burning. Does endorse some acid regurgitation. He has occasional dysphagia with pills but no issues with foods or liquids. He does endorse some thickness in his throat. Denies sore throat, cough, has hoarse voices sometimes upon waking but feels this is related to use of CPAP.  Notably, during ER visit at [facility] on 12/23/20, patient had [place] RUQ with  mild wall thickening of gallbladder, cholelithiasis and sludge, negative Murphy's sign or pericholecystic fluid, CBD upper limits of normal at 6mm. LFTs WNL,  Lipase 77, CT A/P w contrast 12/23/20 w/o evidence of pancreatitis. He denies any RUQ pain, or pain between shoulder blades.   He also reports some irritation of his nose after a sinus infection, reporting discomfort/dryness and dried blood in his Left nare, he has used flonase without any results.   NSAID use:no NSAIDs Social hx: no ETOH in 13 years, smoke Fam hx: no CRC, liver disease  Last Colonoscopy:n/a-does not want to have one Last Endoscopy:many years ago, r/t similar symptoms, thinks it was normal  Recommendations:    Past Medical History:  Diagnosis Date   Acid reflux    Asthma    Depression    Epilepsy (HCC)    High blood pressure    Urticaria     Past Surgical History:  Procedure Laterality Date   CYST REMOVAL NECK     CYST REMOVAL TRUNK     EYE SURGERY Bilateral    HERNIA REPAIR     KNEE SURGERY Left    TONSILLECTOMY      Current Outpatient Medications  Medication Sig Dispense Refill   aspirin EC 81 MG tablet 81 mg.     atorvastatin (LIPITOR) 80 MG tablet 80 mg at bedtime.     busPIRone (BUSPAR) 5 MG tablet Take 5 mg by mouth 2 (two) times daily.     carbamazepine (TEGRETOL) 200 MG tablet Take 200 mg by mouth 3 (three) times daily.     diltiazem (DILACOR XR) 120 MG 24 hr capsule Take 120 mg by mouth daily.  DULoxetine (CYMBALTA) 30 MG capsule Take 30 mg by mouth daily.     ezetimibe (ZETIA) 10 MG tablet Take 10 mg by mouth at bedtime.     famotidine (PEPCID) 20 MG tablet Take 20 mg by mouth 2 (two) times daily.     FLUoxetine (PROZAC) 10 MG capsule Take 10 mg by mouth at bedtime.     gabapentin (NEURONTIN) 300 MG capsule Take 300 mg by mouth 2 (two) times daily.     levETIRAcetam (KEPPRA) 750 MG tablet Take 750 mg by mouth 2 (two) times daily.     LINZESS 290 MCG CAPS capsule      lisinopril (PRINIVIL,ZESTRIL) 20 MG tablet Take 20 mg by mouth daily.     montelukast (SINGULAIR) 10 MG tablet      naproxen (NAPROSYN) 500 MG tablet Take 500 mg by mouth 2  (two) times daily with a meal.     pantoprazole (PROTONIX) 40 MG tablet Take 40 mg by mouth daily.     tamsulosin (FLOMAX) 0.4 MG CAPS capsule Take 0.4 mg by mouth.     benzonatate (TESSALON) 100 MG capsule Take 1 capsule (100 mg total) by mouth every 8 (eight) hours. (Patient not taking: Reported on 01/05/2021) 30 capsule 0   cetirizine (ZYRTEC) 5 MG chewable tablet Chew 1 tablet (5 mg total) by mouth daily. (Patient not taking: Reported on 01/05/2021) 30 tablet 0   esomeprazole (NEXIUM) 20 MG capsule Take 20 mg by mouth daily. (Patient not taking: Reported on 01/05/2021)     HYDROcodone-acetaminophen (NORCO) 10-325 MG tablet  (Patient not taking: Reported on 01/05/2021)     meloxicam (MOBIC) 7.5 MG tablet Take 7.5 mg by mouth 2 (two) times daily. (Patient not taking: Reported on 01/05/2021)     mirtazapine (REMERON) 30 MG tablet 30 mg. (Patient not taking: Reported on 01/05/2021)     oxyCODONE-acetaminophen (PERCOCET) 10-325 MG tablet Take 1 tablet by mouth every 4 (four) hours as needed for pain. (Patient not taking: Reported on 01/05/2021)     ranitidine (ZANTAC) 300 MG capsule  (Patient not taking: Reported on 01/05/2021)     tiZANidine (ZANAFLEX) 4 MG tablet Take 4 mg by mouth. (Patient not taking: Reported on 01/05/2021)     VENTOLIN HFA 108 (90 Base) MCG/ACT inhaler  (Patient not taking: Reported on 01/05/2021)     VOLTAREN 1 % GEL  (Patient not taking: Reported on 01/05/2021)     No current facility-administered medications for this visit.    Allergies as of 01/05/2021 - Review Complete 12/15/2020  Allergen Reaction Noted   Cephalexin Hives 03/24/2016   Doxycycline Hives 03/24/2016   Penicillins Hives 07/11/2016   Sulfa antibiotics Hives 10/03/2013    Family History  Problem Relation Age of Onset   Congestive Heart Failure Mother    Lung cancer Father    Diabetes Sister    Diabetes Brother    Diabetes Maternal Aunt    Lung cancer Maternal Grandfather     Social History    Socioeconomic History   Marital status: Married    Spouse name: Not on file   Number of children: Not on file   Years of education: Not on file   Highest education level: Not on file  Occupational History   Not on file  Tobacco Use   Smoking status: Every Day    Types: E-cigarettes   Smokeless tobacco: Never  Substance and Sexual Activity   Alcohol use: No   Drug use: No   Sexual activity:  Not on file  Other Topics Concern   Not on file  Social History Narrative   Not on file   Social Determinants of Health   Financial Resource Strain: Not on file  Food Insecurity: Not on file  Transportation Needs: Not on file  Physical Activity: Not on file  Stress: Not on file  Social Connections: Not on file   Review of systems General: negative for malaise, night sweats, fever, chills, weight loss Neck: Negative for lumps, goiter, pain and significant neck swelling Resp: Negative for cough, wheezing, dyspnea at rest HEENT: +irritation/discomfort and dried blood in nares CV: Negative for chest pain, leg swelling, palpitations, orthopnea GI: denies melena, hematochezia,  vomiting, diarrhea,, dysphagia, odyonophagia, early satiety or unintentional weight loss. +nausea +constipation +stomach/epigastric burning MSK: Negative for joint pain or swelling, back pain, and muscle pain. Derm: Negative for itching or rash Psych: Denies depression, anxiety, memory loss, confusion. No homicidal or suicidal ideation.  Heme: Negative for prolonged bleeding, bruising easily, and swollen nodes. Endocrine: Negative for cold or heat intolerance, polyuria, polydipsia and goiter. Neuro: negative for tremor, gait imbalance, syncope and seizures. The remainder of the review of systems is noncontributory.  Physical Exam: BP (!) 160/85 (BP Location: Right Arm, Patient Position: Sitting, Cuff Size: Normal)   Pulse 82   Temp 98 F (36.7 C) (Oral)   Ht 5\' 7"  (1.702 m)   Wt 179 lb 12.8 oz (81.6 kg)    BMI 28.16 kg/m  General:   Alert and oriented. No distress noted. Pleasant and cooperative.  Head:  Normocephalic and atraumatic. Eyes:  Conjuctiva clear without scleral icterus. Mouth:  Oral mucosa pink and moist. Good dentition. No lesions. Heart: Normal rate and rhythm, s1 and s2 heart sounds present.  Lungs: Clear lung sounds in all lobes. Respirations equal and unlabored. Abdomen:  +BS, soft, non-tender and non-distended. No rebound or guarding. No HSM or masses noted. Derm: No palmar erythema or jaundice Msk:  Symmetrical without gross deformities. Normal posture. Extremities:  Without edema. Neurologic:  Alert and  oriented x4 Psych:  Alert and cooperative. Normal mood and affect.  Invalid input(s): 6 MONTHS   ASSESSMENT: Adrian Bird is a 65 y.o. male presenting today for ongoing GERD/burning to stomach.  Burning to stomach has been ongoing for a while. Patient was started on pantoprazole 40mg  one daily and carafate 1g tablets QID by his pcp without improvement in symptoms. He denies early satiety, bloating, vomiting, melena, rectal bleeding or postprandial abdominal pain. Having worsening burning to stomach/epigastric region as well as acid regurgitation after eating. He does endorse some nausea related to ongoing acid regurgitation. He reports history of PUD in distant past, no records available for review of this. He has mobic listed on his med list, however, he is unsure if he takes this as the pharmacy organizes all his medications for him and he is illiterate. He denies any other NSAIDs besides an EC 81mg  ASA daily. I suspect he is experiencing severe, uncontrolled GERD, it is possible he has some underlying esophagitis. PUD cannot be ruled out, however, his symptoms do not correlate well with presence of PUD and no alarm symptoms are present. We will start with medical management/change of therapy and proceed with endoscopic evaluation if symptoms are not improved at next  OV or if new or worsening symptoms arise before then.   Patient also reporting ongoing irritation/dryness and dried blood in his nares after having a sinus infection, has used flonase without resolve. I am  sending him ocean nasal spray to use once daily to see if this helps with his symptoms, he should follow up with PCP regarding this if there is no improvement.   PLAN:  Stop pantoprazole  2. Start omeprazole 40mg  BID 3. Stop carafate tablets, start liquid carafate QID 4. Reflux precautions (stay upright 2-3 hrs after eating, avoid spicy, greasy, caffeine, chocolate) 5. Ocean spray to nares once daily 6. Stop mobic and avoid other NSAIDs, baby ASA okay to continue 7. Proceed with EGD if symptoms not improved with PPI therapy change/carafate  Follow Up: 4-6 weeks  Clorinda Wyble L. , MSN, APRN, AGNP-C Adult-Gerontology Nurse Practitioner [facility]

## 2021-01-05 NOTE — Patient Instructions (Addendum)
-  Please stop taking the pantoprazole 40mg  (protonix).  -I am sending omeprazole 40mg  to your pharmacy, you will take this one twice a day, 30 minutes before breakfast and 30 minutes before dinner. -please stop taking the carafate tablets. I have sent in carafate liquid for you to take four times per day, this will help coat your GI tract and hopefully help with the burning sensation you are having  -Avoid greasy, spicy, fried foods as well as chocolate, and caffeine as these can worsen your acid reflux. Please let me know if you develop and black stools or blood in your stools, vomiting, or severe abdominal pain.  -if you are taking any NSAIDs (ibuprofen, aleve, naproxen, good powdery, meloxicam (mobic) you should stop these as these medications are very hard on your GI tract and can worsen acid reflux or lead to ulcers.   I will see you back in 4-6 weeks to see how your symptoms are, if things have not improved, we will need to do an upper endoscopy to take a look into your stomach with a camera and make sure there are no ulcers present.   I have also sent ocean spray for your nose, please do one spray into each nostril once daily, this should help with the dryness and discomfort you are experiencing

## 2021-01-13 ENCOUNTER — Telehealth (INDEPENDENT_AMBULATORY_CARE_PROVIDER_SITE_OTHER): Payer: Self-pay | Admitting: *Deleted

## 2021-01-13 NOTE — Telephone Encounter (Signed)
Pt called and states since starting carafate liquid is making him sleepy. Taking qid and states every time he takes it he gets so sleepy he cannot even leave his house. States reflux is much better since changing meds. Last office visit 01/05/21

## 2021-01-13 NOTE — Telephone Encounter (Signed)
Patient aware of all.

## 2021-01-21 ENCOUNTER — Other Ambulatory Visit (INDEPENDENT_AMBULATORY_CARE_PROVIDER_SITE_OTHER): Payer: Self-pay | Admitting: Gastroenterology

## 2021-01-25 NOTE — Telephone Encounter (Signed)
Called Adrian Bird's pharm and pharmacist states patient asked for refill. This does not need PA

## 2021-01-26 DIAGNOSIS — J323 Chronic sphenoidal sinusitis: Secondary | ICD-10-CM | POA: Insufficient documentation

## 2021-02-17 ENCOUNTER — Other Ambulatory Visit (INDEPENDENT_AMBULATORY_CARE_PROVIDER_SITE_OTHER): Payer: Self-pay | Admitting: Gastroenterology

## 2021-02-17 ENCOUNTER — Ambulatory Visit (INDEPENDENT_AMBULATORY_CARE_PROVIDER_SITE_OTHER): Payer: Medicare Other | Admitting: Gastroenterology

## 2021-02-18 NOTE — Telephone Encounter (Signed)
Last seen 01/05/21

## 2021-02-19 ENCOUNTER — Other Ambulatory Visit (INDEPENDENT_AMBULATORY_CARE_PROVIDER_SITE_OTHER): Payer: Self-pay | Admitting: Gastroenterology

## 2021-02-23 NOTE — Telephone Encounter (Signed)
Last seen 01/05/21

## 2021-03-10 ENCOUNTER — Ambulatory Visit: Payer: Medicare Other | Admitting: Nurse Practitioner

## 2021-03-16 ENCOUNTER — Other Ambulatory Visit (INDEPENDENT_AMBULATORY_CARE_PROVIDER_SITE_OTHER): Payer: Self-pay | Admitting: Gastroenterology

## 2021-07-26 ENCOUNTER — Ambulatory Visit (INDEPENDENT_AMBULATORY_CARE_PROVIDER_SITE_OTHER): Payer: Medicare Other | Admitting: Nurse Practitioner

## 2021-07-26 ENCOUNTER — Encounter: Payer: Self-pay | Admitting: Nurse Practitioner

## 2021-07-26 VITALS — BP 139/79 | HR 80 | Ht 67.0 in | Wt 176.6 lb

## 2021-07-26 DIAGNOSIS — R739 Hyperglycemia, unspecified: Secondary | ICD-10-CM | POA: Diagnosis not present

## 2021-07-26 DIAGNOSIS — R790 Abnormal level of blood mineral: Secondary | ICD-10-CM

## 2021-07-26 DIAGNOSIS — Z8669 Personal history of other diseases of the nervous system and sense organs: Secondary | ICD-10-CM

## 2021-07-26 DIAGNOSIS — H6122 Impacted cerumen, left ear: Secondary | ICD-10-CM

## 2021-07-26 DIAGNOSIS — L989 Disorder of the skin and subcutaneous tissue, unspecified: Secondary | ICD-10-CM

## 2021-07-26 DIAGNOSIS — E785 Hyperlipidemia, unspecified: Secondary | ICD-10-CM

## 2021-07-26 DIAGNOSIS — I700 Atherosclerosis of aorta: Secondary | ICD-10-CM

## 2021-07-26 DIAGNOSIS — R6889 Other general symptoms and signs: Secondary | ICD-10-CM

## 2021-07-26 MED ORDER — GLUCOSE BLOOD VI STRP: ORAL_STRIP | 12 refills | Status: DC

## 2021-07-26 NOTE — Progress Notes (Signed)
Subjective:    Patient ID: Adrian Bird, male    DOB: [birth year], 66 y.o.   MRN: [id number]  HPI  Patient arrives to establish care. Patient would like mole on back checked-stays dry and itches. Patient states that he has a history of weird looking moles and has had several moles removed in the past.  Patient has history of seizures. States that his last seizure was in July 2022. Patient has a Psychologist, counselling that helps him at home due to his history of seizures. Patient takes Tegretol 253m and Keppra 7563mdaily. 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 Patient states that he is not currently being followed by a neurologist but that he had a head scan last year that said his brain looked fine. Patient states that he his not interested in seeing a Neurologist because they want to operate and his is not interested in having an operation.   Brain CT 07/21/2020:  1. Questionable subtle edema within the left temporal lobe. Further evaluation with MRI could be obtained if deemed clinically  appropriate.  2. Mild atrophy   Patient also states that he cannot travel outside of the county due to his disability which limits which specialist he can see.   Hyperglycemia. Patient states that he was diagnosed with diabetes last year and was prescribed metformin. Patient states that he stopped using metformin because of GI upset. Patient test his blood sugars at home and states that he blood sugars run from 85-120 fasting.   Patient also states that his left ear feels stopped up and he would like it flushed today.   Patient also states that he feels cold sometimes. Denies any chest palpitations, difficulty breathing, or SOB.   Review of Systems  HENT:         Ear pressure  All other systems reviewed and are negative.      Objective:   Physical Exam Vitals reviewed.  Constitutional:      General: He is  not in acute distress.    Appearance: Normal appearance. He is normal weight. He is not ill-appearing, toxic-appearing or diaphoretic.  HENT:     Head: Normocephalic and atraumatic.     Right Ear: Tympanic membrane, ear canal and external ear normal.     Left Ear: There is impacted cerumen.     Nose: Nose normal. No congestion or rhinorrhea.     Mouth/Throat:     Mouth: Mucous membranes are moist.     Pharynx: No oropharyngeal exudate or posterior oropharyngeal erythema.     Comments: Abnormal dentation. Multiple missing teeth Eyes:     General:        Right eye: No discharge.        Left eye: No discharge.     Extraocular Movements: Extraocular movements intact.     Conjunctiva/sclera: Conjunctivae normal.     Pupils: Pupils are equal, round, and reactive to light.     Comments: Pupils constricted however reactive to light  Cardiovascular:     Rate and Rhythm: Normal rate and regular rhythm.     Pulses: Normal pulses.     Heart sounds: Normal heart sounds. No murmur heard. Pulmonary:     Effort: Pulmonary effort is normal. No respiratory distress.     Breath sounds: Normal breath sounds. No wheezing.  Abdominal:     General: Abdomen is flat. Bowel sounds are  normal.  Musculoskeletal:     Cervical back: Normal range of motion and neck supple. No rigidity or tenderness.     Comments: Grossly intact. Ambulates without difficulty  Lymphadenopathy:     Cervical: No cervical adenopathy.  Skin:    General: Skin is warm.     Capillary Refill: Capillary refill takes less than 2 seconds.     Comments: 3 small keratic moles noted patient's back. One located middle of his back on the left side. And two noted along the midline of his back. Brown in color. Uniform in color. Scaly and dry.    Neurological:     Mental Status: He is alert.     Cranial Nerves: No cranial nerve deficit.     Motor: No weakness.     Coordination: Coordination normal.     Gait: Gait normal.     Comments:  Grossly intact  Psychiatric:        Mood and Affect: Mood normal.        Behavior: Behavior normal.           Assessment & Plan:   1. Skin lesion - suspect seborrhoic keratosis - Ambulatory referral to Dermatology for evaluation and possible removal  2. History of epilepsy - Patient appears to be stable on current anti-seizure regiment of Keppra 724m and Tegretol 2073m  - Patient self reports having a recent brain image last year.  - CT of brain on 07/21/2020 noted mild edema within the left temporal lobe. Per ED note on 07/21/2020 patient was to get MRI but left against medical advise because he did not want to be admitted.  -Patient has no complaints.  [facility] however patient not interested in going to neurology at this time.  - Will continue to monitor patient to determine if MRI indicated in near future.     3. Hyperglycemia - Will evaluate A1c to determine if patient  needs to be on diabetic medications.  - Hemoglobin A1c - CMP14+EGFR - glucose blood test strip; Use as instructed  Dispense: 100 each; Refill: 12 - RTC in 3 months for follow up or sooner depending on lab results.  4. Impacted cerumen of left ear - Ear wax removal  5. Cold intolerance - CBC with Differential/Platelet - Fe+TIBC+Fer  6. Hyperlipidemia, unspecified hyperlipidemia type - Lipid panel    Note:  This document was prepared using Dragon voice recognition software and may include unintentional dictation errors. Note - This record has been created using [employer] Chart creation errors have been sought, but may not always  have been located. Such creation errors do not reflect on  the standard of medical care.

## 2021-07-27 ENCOUNTER — Other Ambulatory Visit: Payer: Self-pay | Admitting: Nurse Practitioner

## 2021-07-27 ENCOUNTER — Other Ambulatory Visit: Payer: Self-pay | Admitting: Family Medicine

## 2021-07-27 LAB — CMP14+EGFR
ALT: 21 IU/L (ref 0–44)
AST: 20 IU/L (ref 0–40)
Albumin/Globulin Ratio: 2.2 (ref 1.2–2.2)
Albumin: 4.7 g/dL (ref 3.8–4.8)
Alkaline Phosphatase: 101 IU/L (ref 44–121)
BUN/Creatinine Ratio: 20 (ref 10–24)
BUN: 14 mg/dL (ref 8–27)
Bilirubin Total: <0.2 mg/dL (ref 0.0–1.2)
CO2: 25 mmol/L (ref 20–29)
Calcium: 9.6 mg/dL (ref 8.6–10.2)
Chloride: 99 mmol/L (ref 96–106)
Creatinine, Ser: 0.7 mg/dL — ABNORMAL LOW (ref 0.76–1.27)
Globulin, Total: 2.1 g/dL (ref 1.5–4.5)
Glucose: 86 mg/dL (ref 70–99)
Potassium: 4.3 mmol/L (ref 3.5–5.2)
Sodium: 139 mmol/L (ref 134–144)
Total Protein: 6.8 g/dL (ref 6.0–8.5)
eGFR: 102 mL/min/{1.73_m2} (ref >59)

## 2021-07-27 LAB — IRON,TIBC AND FERRITIN PANEL
Ferritin: 26 ng/mL — ABNORMAL LOW (ref 30–400)
Iron Saturation: 14 % — ABNORMAL LOW (ref 15–55)
Iron: 53 ug/dL (ref 38–169)
Total Iron Binding Capacity: 376 ug/dL (ref 250–450)
UIBC: 323 ug/dL (ref 111–343)

## 2021-07-27 LAB — LIPID PANEL
Chol/HDL Ratio: 2.3 ratio (ref 0.0–5.0)
Cholesterol, Total: 133 mg/dL (ref 100–199)
HDL: 57 mg/dL (ref >39)
LDL Chol Calc (NIH): 60 mg/dL (ref 0–99)
Triglycerides: 82 mg/dL (ref 0–149)
VLDL Cholesterol Cal: 16 mg/dL (ref 5–40)

## 2021-07-27 LAB — CBC WITH DIFFERENTIAL/PLATELET
Basophils Absolute: 0.1 10*3/uL (ref 0.0–0.2)
Basos: 1 %
EOS (ABSOLUTE): 0.2 10*3/uL (ref 0.0–0.4)
Eos: 3 %
Hematocrit: 43 % (ref 37.5–51.0)
Hemoglobin: 14.4 g/dL (ref 13.0–17.7)
Immature Grans (Abs): 0 10*3/uL (ref 0.0–0.1)
Immature Granulocytes: 0 %
Lymphocytes Absolute: 1.6 10*3/uL (ref 0.7–3.1)
Lymphs: 27 %
MCH: 30.2 pg (ref 26.6–33.0)
MCHC: 33.5 g/dL (ref 31.5–35.7)
MCV: 90 fL (ref 79–97)
Monocytes Absolute: 1.1 10*3/uL — ABNORMAL HIGH (ref 0.1–0.9)
Monocytes: 17 %
Neutrophils Absolute: 3.1 10*3/uL (ref 1.4–7.0)
Neutrophils: 52 %
Platelets: 287 10*3/uL (ref 150–450)
RBC: 4.77 x10E6/uL (ref 4.14–5.80)
RDW: 12.7 % (ref 11.6–15.4)
WBC: 6.1 10*3/uL (ref 3.4–10.8)

## 2021-07-27 LAB — HEMOGLOBIN A1C
Est. average glucose Bld gHb Est-mCnc: 123 mg/dL
Hgb A1c MFr Bld: 5.9 % — ABNORMAL HIGH (ref 4.8–5.6)

## 2021-07-27 MED ORDER — IRON (FERROUS SULFATE) 325 (65 FE) MG PO TABS: 325.0000 mg | ORAL_TABLET | Freq: Every day | ORAL | 2 refills | Status: DC

## 2021-07-28 ENCOUNTER — Other Ambulatory Visit: Payer: Self-pay | Admitting: Nurse Practitioner

## 2021-07-29 ENCOUNTER — Other Ambulatory Visit: Payer: Self-pay | Admitting: Nurse Practitioner

## 2021-08-25 ENCOUNTER — Other Ambulatory Visit: Payer: Self-pay | Admitting: Nurse Practitioner

## 2021-08-30 ENCOUNTER — Other Ambulatory Visit: Payer: Self-pay | Admitting: *Deleted

## 2021-08-30 DIAGNOSIS — R739 Hyperglycemia, unspecified: Secondary | ICD-10-CM

## 2021-08-30 MED ORDER — GLUCOSE BLOOD VI STRP: ORAL_STRIP | 12 refills | Status: DC

## 2021-09-02 ENCOUNTER — Other Ambulatory Visit: Payer: Self-pay | Admitting: Nurse Practitioner

## 2021-09-27 ENCOUNTER — Other Ambulatory Visit: Payer: Self-pay | Admitting: Nurse Practitioner

## 2021-09-29 ENCOUNTER — Other Ambulatory Visit: Payer: Self-pay | Admitting: Nurse Practitioner

## 2021-09-29 ENCOUNTER — Telehealth: Payer: Self-pay

## 2021-09-29 NOTE — Telephone Encounter (Signed)
Encourage patient to contact the pharmacy for refills or they can request refills through [facility]  (Please schedule appointment if patient has not been seen in over a year)    WHAT PHARMACY WOULD THEY LIKE THIS SENT TO: [facility] - [place], [place] - [address]  [address], [place] [address]   MEDICATION NAME & DOSE:atorvastatin (LIPITOR) 80 MG tablet , busPIRone (BUSPAR) 5 MG tablet , carbamazepine (TEGRETOL) 200 MG tablet , diltiazem (CARDIZEM CD) 120 MG 24 hr capsule , ezetimibe (ZETIA) 10 MG tablet, famotidine (PEPCID) 20 MG tablet , FLUoxetine (PROZAC) 10 MG capsule , levETIRAcetam (KEPPRA) 750 MG tablet , lisinopril (ZESTRIL) 20 MG tablet , montelukast (SINGULAIR) 10 MG tablet , omeprazole (PRILOSEC) 40 MG capsule, tamsulosin (FLOMAX) 0.4 MG CAPS capsule   NOTES/COMMENTS FROM PATIENT:      Front office please notify patient: It takes 48-72 hours to process rx refill requests Ask patient to call pharmacy to ensure rx is ready before heading there.

## 2021-09-30 ENCOUNTER — Other Ambulatory Visit (INDEPENDENT_AMBULATORY_CARE_PROVIDER_SITE_OTHER): Payer: Self-pay | Admitting: Gastroenterology

## 2021-09-30 ENCOUNTER — Other Ambulatory Visit: Payer: Self-pay | Admitting: Nurse Practitioner

## 2021-09-30 MED ORDER — CARBAMAZEPINE 200 MG PO TABS: 200.0000 mg | ORAL_TABLET | Freq: Three times a day (TID) | ORAL | 0 refills | Status: DC

## 2021-09-30 MED ORDER — FLUOXETINE HCL 10 MG PO CAPS: 10.0000 mg | ORAL_CAPSULE | Freq: Every day | ORAL | 0 refills | Status: DC

## 2021-09-30 MED ORDER — FAMOTIDINE 20 MG PO TABS: ORAL_TABLET | ORAL | 0 refills | Status: DC

## 2021-09-30 MED ORDER — MONTELUKAST SODIUM 10 MG PO TABS: 10.0000 mg | ORAL_TABLET | Freq: Every day | ORAL | 0 refills | Status: DC

## 2021-09-30 MED ORDER — LEVETIRACETAM 750 MG PO TABS: 750.0000 mg | ORAL_TABLET | Freq: Two times a day (BID) | ORAL | 0 refills | Status: DC

## 2021-09-30 MED ORDER — BUSPIRONE HCL 5 MG PO TABS: 5.0000 mg | ORAL_TABLET | Freq: Two times a day (BID) | ORAL | 0 refills | Status: DC

## 2021-09-30 MED ORDER — EZETIMIBE 10 MG PO TABS: ORAL_TABLET | ORAL | 0 refills | Status: DC

## 2021-09-30 MED ORDER — LISINOPRIL 20 MG PO TABS: 20.0000 mg | ORAL_TABLET | Freq: Every morning | ORAL | 0 refills | Status: DC

## 2021-09-30 MED ORDER — OMEPRAZOLE 40 MG PO CPDR: 40.0000 mg | DELAYED_RELEASE_CAPSULE | Freq: Two times a day (BID) | ORAL | 3 refills | Status: DC

## 2021-09-30 MED ORDER — TAMSULOSIN HCL 0.4 MG PO CAPS: 0.4000 mg | ORAL_CAPSULE | Freq: Every day | ORAL | 0 refills | Status: DC

## 2021-09-30 MED ORDER — DILTIAZEM HCL ER COATED BEADS 120 MG PO CP24: 120.0000 mg | ORAL_CAPSULE | Freq: Every day | ORAL | 0 refills | Status: DC

## 2021-09-30 MED ORDER — ATORVASTATIN CALCIUM 80 MG PO TABS: ORAL_TABLET | ORAL | 0 refills | Status: DC

## 2021-09-30 NOTE — Addendum Note (Signed)
Addended by: Marlowe Shores on: 09/30/2021 01:37 PM   Modules accepted: Orders

## 2021-09-30 NOTE — Telephone Encounter (Signed)
Refills of all meds sent to pharmacy and pt is aware

## 2021-10-22 ENCOUNTER — Other Ambulatory Visit: Payer: Self-pay | Admitting: Nurse Practitioner

## 2021-10-26 ENCOUNTER — Ambulatory Visit (INDEPENDENT_AMBULATORY_CARE_PROVIDER_SITE_OTHER): Payer: Medicare Other | Admitting: Nurse Practitioner

## 2021-10-26 ENCOUNTER — Other Ambulatory Visit (HOSPITAL_COMMUNITY)
Admission: RE | Admit: 2021-10-26 | Discharge: 2021-10-26 | Disposition: A | Discharge status: Home / Self Care | Payer: Medicare Other | Source: Ambulatory Visit | Attending: Nurse Practitioner | Admitting: Nurse Practitioner

## 2021-10-26 ENCOUNTER — Ambulatory Visit (HOSPITAL_COMMUNITY): Payer: Medicare Other

## 2021-10-26 VITALS — BP 128/84 | Ht 67.0 in | Wt 176.6 lb

## 2021-10-26 DIAGNOSIS — Z8711 Personal history of peptic ulcer disease: Secondary | ICD-10-CM | POA: Insufficient documentation

## 2021-10-26 DIAGNOSIS — Z79899 Other long term (current) drug therapy: Secondary | ICD-10-CM | POA: Diagnosis not present

## 2021-10-26 DIAGNOSIS — D509 Iron deficiency anemia, unspecified: Secondary | ICD-10-CM | POA: Insufficient documentation

## 2021-10-26 DIAGNOSIS — R1084 Generalized abdominal pain: Secondary | ICD-10-CM | POA: Diagnosis present

## 2021-10-26 LAB — COMPREHENSIVE METABOLIC PANEL
ALT: 25 U/L (ref 0–44)
AST: 17 U/L (ref 15–41)
Albumin: 4.1 g/dL (ref 3.5–5.0)
Alkaline Phosphatase: 87 U/L (ref 38–126)
Anion gap: 8 (ref 5–15)
BUN: 8 mg/dL (ref 8–23)
CO2: 27 mmol/L (ref 22–32)
Calcium: 8.8 mg/dL — ABNORMAL LOW (ref 8.9–10.3)
Chloride: 98 mmol/L (ref 98–111)
Creatinine, Ser: 0.59 mg/dL — ABNORMAL LOW (ref 0.61–1.24)
GFR, Estimated: >60 mL/min (ref >60)
Glucose, Bld: 100 mg/dL — ABNORMAL HIGH (ref 70–99)
Potassium: 3.8 mmol/L (ref 3.5–5.1)
Sodium: 133 mmol/L — ABNORMAL LOW (ref 135–145)
Total Bilirubin: 0.4 mg/dL (ref 0.3–1.2)
Total Protein: 7 g/dL (ref 6.5–8.1)

## 2021-10-26 LAB — CBC WITH DIFFERENTIAL/PLATELET
Abs Immature Granulocytes: 0.01 10*3/uL (ref 0.00–0.07)
Basophils Absolute: 0.1 10*3/uL (ref 0.0–0.1)
Basophils Relative: 1 %
Eosinophils Absolute: 0.1 10*3/uL (ref 0.0–0.5)
Eosinophils Relative: 2 %
HCT: 42.1 % (ref 39.0–52.0)
Hemoglobin: 14.3 g/dL (ref 13.0–17.0)
Immature Granulocytes: 0 %
Lymphocytes Relative: 29 %
Lymphs Abs: 1.5 10*3/uL (ref 0.7–4.0)
MCH: 30.9 pg (ref 26.0–34.0)
MCHC: 34 g/dL (ref 30.0–36.0)
MCV: 90.9 fL (ref 80.0–100.0)
Monocytes Absolute: 0.9 10*3/uL (ref 0.1–1.0)
Monocytes Relative: 18 %
Neutro Abs: 2.6 10*3/uL (ref 1.7–7.7)
Neutrophils Relative %: 50 %
Platelets: 272 10*3/uL (ref 150–400)
RBC: 4.63 MIL/uL (ref 4.22–5.81)
RDW: 13.4 % (ref 11.5–15.5)
WBC: 5.1 10*3/uL (ref 4.0–10.5)
nRBC: 0 % (ref 0.0–0.2)

## 2021-10-26 LAB — AMYLASE: Amylase: 50 U/L (ref 28–100)

## 2021-10-26 LAB — TROPONIN I (HIGH SENSITIVITY): Troponin I (High Sensitivity): 3 ng/L (ref <18)

## 2021-10-26 LAB — LIPASE, BLOOD: Lipase: 26 U/L (ref 11–51)

## 2021-10-26 LAB — FERRITIN: Ferritin: 13 ng/mL — ABNORMAL LOW (ref 24–336)

## 2021-10-26 NOTE — Progress Notes (Signed)
Subjective:    Patient ID: Adrian Bird, male    DOB: 06-18-1955, 66 y.o.   MRN: [id number]  Hypertension This is a chronic problem. The current episode started more than 1 year ago. Risk factors for coronary artery disease include dyslipidemia and male gender. Treatments tried: cardizem.   Acid reflux acting up- burning in bottom of stomach.  Patient states that when he eats the burning to her stomach gets worse.  Patient describes burning to his epigastric region as well as his lower abdominal region.  Patient denies any nausea, vomiting, blood in stool, mucus in stool, fever, body aches, chills.  Patient states that for the past week or so he just been feeling down.  Patient states that he has been tired a lot.  Patient denies any shortness of breath or chest pain.  Patient also on a lot of medications.  Patient unsure if he is currently taking iron or not.    Review of Systems  Gastrointestinal:  Positive for abdominal pain.       Objective:   Physical Exam Vitals reviewed.  Constitutional:      General: He is not in acute distress.    Appearance: Normal appearance. He is normal weight. He is not ill-appearing, toxic-appearing or diaphoretic.  HENT:     Head: Normocephalic and atraumatic.  Cardiovascular:     Rate and Rhythm: Normal rate and regular rhythm.     Pulses: Normal pulses.     Heart sounds: Normal heart sounds. No murmur heard. Pulmonary:     Effort: Pulmonary effort is normal. No respiratory distress.     Breath sounds: Normal breath sounds. No wheezing (.bdragon).  Abdominal:     General: Bowel sounds are normal.     Palpations: Abdomen is soft.     Tenderness: There is abdominal tenderness in the right upper quadrant, right lower quadrant and epigastric area. There is no guarding or rebound. Negative signs include Murphy's sign, Rovsing's sign, McBurney's sign and psoas sign.  Musculoskeletal:     Cervical back: Normal range of motion and neck  supple. No rigidity or tenderness.     Comments: Grossly intact  Lymphadenopathy:     Cervical: No cervical adenopathy.  Skin:    General: Skin is warm.     Capillary Refill: Capillary refill takes less than 2 seconds.  Neurological:     Mental Status: He is alert.     Comments: Grossly intact  Psychiatric:        Mood and Affect: Mood normal.        Behavior: Behavior normal.           Assessment & Plan:   1. Generalized abdominal pain -Unsure of etiology -However will rule out MI with EKG and evaluate for biliary or pancreatic etiologies with abdominal ultrasound -We will also assess lab work -Patient states has history of peptic ulcer disease.  Due to history we will also have patient evaluated with GI for colonoscopy and possibly EGD - US Abdomen Complete - EKG 12-Lead = sinus rhythm - Amylase - CBC with Differential/Platelet - Lipase - Troponin I - Comprehensive metabolic panel - Ferritin - Ambulatory referral to Gastroenterology -Return to clinic 1 week  2. Polypharmacy -Patient on multiple medications and unsure which medications he is currently taking. -Patient to follow-up with provider in 1 week to go over medications -Patient to bring in 1 pill pack for the morning, afternoon, evening and list of all medications and their pamphlets -Return  to clinic 1 week   Reviewed stat lab work.  Ferritin mildly low.  Concerns for GI bleed.  Patient referred to GI.  We will send in new prescription of iron to ensure patient is taking it.

## 2021-10-27 ENCOUNTER — Other Ambulatory Visit: Payer: Self-pay | Admitting: Nurse Practitioner

## 2021-10-27 ENCOUNTER — Encounter: Payer: Self-pay | Admitting: *Deleted

## 2021-10-28 ENCOUNTER — Encounter: Payer: Self-pay | Admitting: Nurse Practitioner

## 2021-10-28 ENCOUNTER — Ambulatory Visit (HOSPITAL_COMMUNITY)
Admission: RE | Admit: 2021-10-28 | Discharge: 2021-10-28 | Disposition: A | Discharge status: Home / Self Care | Payer: Medicare Other | Source: Ambulatory Visit | Attending: Nurse Practitioner | Admitting: Nurse Practitioner

## 2021-10-28 DIAGNOSIS — R1084 Generalized abdominal pain: Secondary | ICD-10-CM | POA: Diagnosis present

## 2021-10-28 MED ORDER — IRON (FERROUS SULFATE) 325 (65 FE) MG PO TABS: 325.0000 mg | ORAL_TABLET | Freq: Every day | ORAL | 2 refills | Status: DC

## 2021-11-02 ENCOUNTER — Ambulatory Visit (INDEPENDENT_AMBULATORY_CARE_PROVIDER_SITE_OTHER): Payer: Medicare Other | Admitting: Nurse Practitioner

## 2021-11-02 VITALS — BP 149/75 | HR 66 | Temp 98.2°F | Ht 67.0 in | Wt 176.0 lb

## 2021-11-02 DIAGNOSIS — J4489 Other specified chronic obstructive pulmonary disease: Secondary | ICD-10-CM

## 2021-11-02 DIAGNOSIS — J449 Chronic obstructive pulmonary disease, unspecified: Secondary | ICD-10-CM | POA: Diagnosis not present

## 2021-11-02 DIAGNOSIS — Z79899 Other long term (current) drug therapy: Secondary | ICD-10-CM | POA: Diagnosis not present

## 2021-11-02 DIAGNOSIS — G8929 Other chronic pain: Secondary | ICD-10-CM

## 2021-11-02 DIAGNOSIS — M545 Low back pain, unspecified: Secondary | ICD-10-CM

## 2021-11-02 DIAGNOSIS — K219 Gastro-esophageal reflux disease without esophagitis: Secondary | ICD-10-CM

## 2021-11-02 MED ORDER — GABAPENTIN 300 MG PO CAPS: 300.0000 mg | ORAL_CAPSULE | Freq: Two times a day (BID) | ORAL | 1 refills | Status: DC

## 2021-11-02 MED ORDER — LEVOFLOXACIN 500 MG PO TABS: 500.0000 mg | ORAL_TABLET | Freq: Every day | ORAL | 0 refills | Status: AC

## 2021-11-02 MED ORDER — PANTOPRAZOLE SODIUM 40 MG PO TBEC: 40.0000 mg | DELAYED_RELEASE_TABLET | Freq: Every day | ORAL | 3 refills | Status: DC

## 2021-11-02 MED ORDER — PREDNISONE 20 MG PO TABS: 20.0000 mg | ORAL_TABLET | Freq: Two times a day (BID) | ORAL | 0 refills | Status: DC

## 2021-11-02 NOTE — Patient Instructions (Addendum)
Sent in Gabapentin prescription for pain and anxiety and Protonix for acid reflux. Levaquin and Prednisone for cold symptoms.  STOP: - Prilosec - Naproxen

## 2021-11-02 NOTE — Progress Notes (Unsigned)
   Subjective:    Patient ID: Adrian Bird, male    DOB: 1955/10/02, 66 y.o.   MRN: [id number]  HPI Follow up for polypharmacy Slight cough productive and body aches x 1 week , acid reflux meds not helping    Review of Systems     Objective:   Physical Exam        Assessment & Plan:

## 2021-11-03 ENCOUNTER — Encounter: Payer: Self-pay | Admitting: Nurse Practitioner

## 2021-11-03 DIAGNOSIS — Z79899 Other long term (current) drug therapy: Secondary | ICD-10-CM | POA: Insufficient documentation

## 2021-11-03 DIAGNOSIS — K219 Gastro-esophageal reflux disease without esophagitis: Secondary | ICD-10-CM | POA: Insufficient documentation

## 2021-11-09 ENCOUNTER — Ambulatory Visit (INDEPENDENT_AMBULATORY_CARE_PROVIDER_SITE_OTHER): Payer: Medicare Other | Admitting: Nurse Practitioner

## 2021-11-09 ENCOUNTER — Ambulatory Visit (HOSPITAL_COMMUNITY)
Admission: RE | Admit: 2021-11-09 | Discharge: 2021-11-09 | Disposition: A | Discharge status: Home / Self Care | Payer: Medicare Other | Source: Ambulatory Visit | Attending: Nurse Practitioner | Admitting: Nurse Practitioner

## 2021-11-09 ENCOUNTER — Encounter: Payer: Self-pay | Admitting: Nurse Practitioner

## 2021-11-09 VITALS — BP 138/74 | HR 68 | Temp 98.2°F | Ht 67.0 in | Wt 176.0 lb

## 2021-11-09 DIAGNOSIS — R051 Acute cough: Secondary | ICD-10-CM | POA: Insufficient documentation

## 2021-11-09 MED ORDER — PREDNISONE 20 MG PO TABS: 20.0000 mg | ORAL_TABLET | Freq: Two times a day (BID) | ORAL | 0 refills | Status: AC

## 2021-11-09 NOTE — Progress Notes (Signed)
Subjective:    Patient ID: Adrian Bird, male    DOB: [birth year], 66 y.o.   MRN: [id number]  HPI 66 year old male patient presents to clinic today with complaints of nonproductive cough and nasal congestion x5.  Patient was recently treated for COPD exacerbation with Levaquin.  Patient states that he completed his Levaquin and symptoms seem to dissipate about 3 days after being on antibiotics.  Patient states a few days later he started to feel bad again.  Patient states that he believes he may have been exposed to [place].  Patient denies any fevers, chills, body aches, wheezing, shortness of breath.   Review of Systems  Respiratory:  Positive for cough.        Objective:   Physical Exam Vitals reviewed.  Constitutional:      General: He is not in acute distress.    Appearance: Normal appearance. He is normal weight. He is not ill-appearing, toxic-appearing or diaphoretic.  HENT:     Head: Normocephalic and atraumatic.     Right Ear: Tympanic membrane, ear canal and external ear normal.     Left Ear: Tympanic membrane, ear canal and external ear normal.     Nose: Nose normal.     Mouth/Throat:     Mouth: Mucous membranes are moist.     Pharynx: No oropharyngeal exudate or posterior oropharyngeal erythema.  Eyes:     General:        Right eye: No discharge.        Left eye: No discharge.     Extraocular Movements: Extraocular movements intact.     Conjunctiva/sclera: Conjunctivae normal.     Pupils: Pupils are equal, round, and reactive to light.  Cardiovascular:     Rate and Rhythm: Normal rate and regular rhythm.     Pulses: Normal pulses.     Heart sounds: Normal heart sounds. No murmur heard. Pulmonary:     Effort: Pulmonary effort is normal. No respiratory distress.     Breath sounds: Normal breath sounds. No wheezing.     Comments: Lungs clear to auscultation. Musculoskeletal:     Cervical back: Normal range of motion and neck supple. No rigidity or  tenderness.     Comments: Grossly intact  Lymphadenopathy:     Cervical: No cervical adenopathy.  Skin:    General: Skin is warm.     Capillary Refill: Capillary refill takes less than 2 seconds.  Neurological:     Mental Status: He is alert.     Comments: Grossly intact  Psychiatric:        Mood and Affect: Mood normal.        Behavior: Behavior normal.           Assessment & Plan:   1. Acute cough -Suspect viral etiology today -Low suspicion of bacterial etiology due to recent treatment with antibiotics, however will assess chest x-ray for disease processes -No wheezing heard on today's exam which is a marked difference from previous visit. - DG Chest 2 View - COVID-19, Flu A+B and RSV - predniSONE (DELTASONE) 20 MG tablet; Take 1 tablet (20 mg total) by mouth 2 (two) times daily with a meal for 5 days.  Dispense: 10 tablet; Refill: 0 -Return to clinic if symptoms worsen or do not improve   Note:  This document was prepared using Dragon voice recognition software and may include unintentional dictation errors. Note - This record has been created using [employer].  Chart creation errors have been  sought, but may not always  have been located. Such creation errors do not reflect on  the standard of medical care.

## 2021-11-10 LAB — COVID-19, FLU A+B AND RSV
Influenza A, NAA: NOT DETECTED
Influenza B, NAA: NOT DETECTED
RSV, NAA: NOT DETECTED
SARS-CoV-2, NAA: NOT DETECTED

## 2021-11-10 LAB — SPECIMEN STATUS REPORT

## 2021-11-15 ENCOUNTER — Encounter: Payer: Self-pay | Admitting: Gastroenterology

## 2021-11-15 ENCOUNTER — Telehealth: Payer: Self-pay | Admitting: *Deleted

## 2021-11-15 ENCOUNTER — Ambulatory Visit (INDEPENDENT_AMBULATORY_CARE_PROVIDER_SITE_OTHER): Payer: Medicare Other | Admitting: Gastroenterology

## 2021-11-15 VITALS — BP 145/80 | HR 59 | Temp 97.3°F | Ht 67.0 in | Wt 177.4 lb

## 2021-11-15 DIAGNOSIS — R1013 Epigastric pain: Secondary | ICD-10-CM | POA: Diagnosis not present

## 2021-11-15 DIAGNOSIS — K219 Gastro-esophageal reflux disease without esophagitis: Secondary | ICD-10-CM | POA: Diagnosis not present

## 2021-11-15 DIAGNOSIS — R110 Nausea: Secondary | ICD-10-CM

## 2021-11-15 NOTE — Progress Notes (Signed)
GI Office Note    Referring Provider: Ameduite, Trenton Gammon, NP Primary Care Physician:  Claire Shown, NP Primary Gastroenterologist: Dr. Jenetta Downer  Date:  11/15/2021  ID:  Adrian Bird, Adrian Bird Nov 27, 1955, MRN [id number]   Chief Complaint   Chief Complaint  Patient presents with   New Patient (Initial Visit)    Here for abd pain (mid section of abd).    History of Present Illness  Adrian Bird is a 66 y.o. male with a history of asthma, depression, epilepsy, and HTN presenting today with abdominal pain.  Patient presented for initial consultation 01/05/2021.  He presented with ongoing burning to the mid epigastric region with acid reflux.  He was previously on Protonix 40 mg daily and Carafate 1 g 4 times daily that was started by his PCP.  He was previously on famotidine 20 mg over-the-counter and reported that he stopped taking this after starting PPI and Carafate.  He reports no relief in his symptoms.  At times eating makes the pain better.  Had nausea without vomiting.  Also with constipation related to chronic pain medication and he was taking over-the-counter stool softeners and MiraLAX.  Denied BRBPR or melena.  No lack of appetite.  Reports some occasional pill dysphagia.  Uses CPAP.  He had had recent ED visit 12/23/2020 with right upper quadrant ultrasound revealing mild wall thickening of the gallbladder, cholelithiasis and sludge, and CBD measuring 6 mm.  His LFTs were within normal limits, lipase 77.  CT A/P with contrast without evidence of pancreatitis.  He declined colonoscopy.  Reported a prior endoscopy many years prior and felt like it was normal.  He was switched from pantoprazole to omeprazole 40 mg twice daily, changed to liquid Carafate, and reflux precautions discussed.  Recent visit with PCP 11/02/2021.  She stopped his Prilosec and started him on pantoprazole 40 mg daily due to ongoing burning sensation in his mid abdomen.  He reported he was trying  to avoid tomatoes and greasy foods however he continued to indulge in those types of foods.  The day prior to his appointment with PCP he had spaghetti sauce, hamburgers, [place] fries but stated it was not irritating him.  Also reported some nausea.  Today: Has been better since avoiding tomato products as much as possible. Has been taking pantoprazole 40 mg twice per day. Previously was taking omeprazole. Reports a history of an ulcer, had EGD about 10 years ago. Denies dysphagia, lack of appetite, early satiety. States he treated it by burning it out with hot and spicy foods. Typically uses pill packs and prefers to use those. Has stopped the sucralfate. Was treated with steroids and antibiotics and since then he had a lot of diarrhea and has been fine since that time. Has a bowel movement almost everyday. Takes stool softener, miralax and linzess. Primary care gave him the linzess 290 mcg daily. Denies any chest pain or shortness of breath. Has swelling in legs and feet but that is baseline.   Reports he has a pain clinic appointment tomorrow.    Current Outpatient Medications  Medication Sig Dispense Refill   Accu-Chek Softclix Lancets lancets USE TO TEST BLOOD SUGAR AS DIRECTED BY YOUR PROVIDER. 100 each 0   albuterol (VENTOLIN HFA) 108 (90 Base) MCG/ACT inhaler INHALE 2 PUFFS EVERY 6 HOURS AS NEEDED FOR WHEEZING. 6.7 g 0   aspirin EC 81 MG tablet 81 mg.     atorvastatin (LIPITOR) 80 MG tablet TAKE 1 TABLET EVERY  NIGHT. 90 tablet 0   Blood Glucose Monitoring Suppl (FIFTY50 GLUCOSE METER 2.0) w/Device KIT See admin instructions.     busPIRone (BUSPAR) 5 MG tablet Take 1 tablet (5 mg total) by mouth 2 (two) times daily. 180 tablet 0   carbamazepine (TEGRETOL) 200 MG tablet Take 1 tablet (200 mg total) by mouth 3 (three) times daily. 270 tablet 0   cetirizine (ZYRTEC) 10 MG chewable tablet Chew by mouth.     Cholecalciferol 50 MCG (2000 UT) CAPS Take by mouth.     diltiazem (CARDIZEM CD) 120 MG  24 hr capsule Take 1 capsule (120 mg total) by mouth daily. 90 capsule 0   ezetimibe (ZETIA) 10 MG tablet TAKE 1 TABLET EVERY NIGHT. 90 tablet 0   FLUoxetine (PROZAC) 10 MG capsule Take 1 capsule (10 mg total) by mouth daily. 90 capsule 0   gabapentin (NEURONTIN) 300 MG capsule Take 1 capsule (300 mg total) by mouth 2 (two) times daily. 90 capsule 1   glucose blood test strip Used to check blood sugar 3 times daily.Marland Kitchen dx code: E11.9 100 each 12   ipratropium (ATROVENT) 0.03 % nasal spray 1 spray into each nostril Two (2) times a day for 10 days.     Iron, Ferrous Sulfate, 325 (65 Fe) MG TABS Take 325 mg by mouth daily. 30 tablet 2   levETIRAcetam (KEPPRA) 750 MG tablet Take 1 tablet (750 mg total) by mouth 2 (two) times daily. 180 tablet 0   LINZESS 290 MCG CAPS capsule TAKE 1 CAPSULE BY MOUTH EVERY MORNING. 30 capsule 0   lisinopril (ZESTRIL) 20 MG tablet Take 1 tablet (20 mg total) by mouth every morning. 90 tablet 0   metoprolol tartrate (LOPRESSOR) 25 MG tablet Take by mouth.     montelukast (SINGULAIR) 10 MG tablet Take 1 tablet (10 mg total) by mouth at bedtime. 90 tablet 0   Oxycodone HCl 10 MG TABS Take by mouth. One tablet every 6 hours - 4 a day     sodium chloride (OCEAN) 0.65 % SOLN nasal spray Place 1 spray into both nostrils as needed for congestion. 30 mL 0   tamsulosin (FLOMAX) 0.4 MG CAPS capsule Take 1 capsule (0.4 mg total) by mouth daily. 90 capsule 0   ANORO ELLIPTA 62.5-25 MCG/ACT AEPB INHALE 1 PUFF DAILY. (Patient not taking: Reported on 11/15/2021) 60 each 0   Blood Glucose Monitoring Suppl (ACCU-CHEK GUIDE) w/Device KIT      fluticasone (FLONASE) 50 MCG/ACT nasal spray 1 SPRAY INTO EACH NOSTRIL TWICE DAILY. (Patient not taking: Reported on 11/15/2021) 16 g 0   pantoprazole (PROTONIX) 40 MG tablet Take 1 tablet (40 mg total) by mouth daily. (Patient not taking: Reported on 11/15/2021) 30 tablet 3   sucralfate (CARAFATE) 1 GM/10ML suspension TAKE 10 MLS (1 GRAM TOTAL) BY MOUTH 4  TIMES DAILY WITH MEALS AND AT BEDTIME. (Patient not taking: Reported on 11/15/2021) 414 mL 0   No current facility-administered medications for this visit.    Past Medical History:  Diagnosis Date   Acid reflux    Asthma    Depression    Epilepsy ([place])    High blood pressure    Urticaria     Past Surgical History:  Procedure Laterality Date   CYST REMOVAL NECK     CYST REMOVAL TRUNK     EYE SURGERY Bilateral    HERNIA REPAIR     KNEE SURGERY Left    TONSILLECTOMY      Family  History  Problem Relation Age of Onset   Congestive Heart Failure Mother    Lung cancer Father    Diabetes Sister    Diabetes Brother    Diabetes Maternal Aunt    Lung cancer Maternal Grandfather     Allergies as of 11/15/2021 - Review Complete 11/15/2021  Allergen Reaction Noted   Cephalexin Hives 03/24/2016   Doxycycline Hives 03/24/2016   Penicillins Hives 07/11/2016   Sulfa antibiotics Hives 10/03/2013    Social History   Socioeconomic History   Marital status: Married    Spouse name: Not on file   Number of children: Not on file   Years of education: Not on file   Highest education level: Not on file  Occupational History   Not on file  Tobacco Use   Smoking status: Every Day    Types: E-cigarettes   Smokeless tobacco: Never  Substance and Sexual Activity   Alcohol use: No   Drug use: No   Sexual activity: Not on file  Other Topics Concern   Not on file  Social History Narrative   Not on file   Social Determinants of Health   Financial Resource Strain: Not on file  Food Insecurity: Not on file  Transportation Needs: Not on file  Physical Activity: Not on file  Stress: Not on file  Social Connections: Not on file     Review of Systems   Gen: Denies fever, chills, anorexia. Denies fatigue, weakness, weight loss.  CV: Denies chest pain, palpitations, syncope, peripheral edema, and claudication. Resp: Denies dyspnea at rest, cough, wheezing, coughing up blood, and  pleurisy. GI: See HPI Derm: Denies rash, itching, dry skin Psych: Denies depression, anxiety, memory loss, confusion. No homicidal or suicidal ideation.  Heme: Denies bruising, bleeding, and enlarged lymph nodes.   Physical Exam   BP (!) 145/80   Pulse (!) 59   Temp (!) 97.3 F (36.3 C)   Ht 5' 7"  (1.702 m)   Wt 177 lb 6.4 oz (80.5 kg)   BMI 27.78 kg/m   General:   Alert and oriented. No distress noted. Pleasant and cooperative.  Head:  Normocephalic and atraumatic. Eyes:  Conjuctiva clear without scleral icterus. Mouth:  Oral mucosa pink and moist. Good dentition. No lesions. Lungs:  Clear to auscultation bilaterally. No wheezes, rales, or rhonchi. No distress.  Heart:  S1, S2 present without murmurs appreciated.  Abdomen:  +BS, soft, non-tender and non-distended. No rebound or guarding. No HSM or masses noted. Rectal: deferred Msk:  Symmetrical without gross deformities. Normal posture. Extremities:  Without edema. Neurologic:  Alert and oriented x4 Psych:  Alert and cooperative. Normal mood and affect.   Assessment  Adrian Bird is a 66 y.o. male with a history of history of asthma, depression, epilepsy, and HTN presenting today with abdominal pain.   GERD/abdominal pain: Continues to have some acid reflux with epigastric burning at times.  It has been better if he avoids tomato based products, however states that he really likes spaghetti will still eat this at times.  Has had some intermittent nausea but no vomiting recently.  Denies frequent NSAID use, only takes a daily 81 mg aspirin.  He reports a history of an ulcer in the past, with an EGD about 10 years ago but does not remember where this was performed.  He has previously tried omeprazole, and recently switched to pantoprazole 40 mg twice daily and has noticed some improvement but continues to have symptoms.  No alarm  symptoms present.  We will proceed with upper endoscopy for further evaluation for possible  gastritis, duodenitis, peptic ulcer.  Of note patient does have a history of epilepsy, currently on medication regimen and no report of any recent seizures.  He is illiterate, therefore is unsure of any of his current medications but he has a Psychologist, counselling that helps him with all of his medications and healthcare appointments.  Constipation: Currently on over-the-counter Colace and MiraLAX as well as Linzess 290 mcg daily. Having a good daily soft bowel movement.  Denies straining.  Denies any melena or BRBPR.   PLAN   Proceed with upper endoscopy with propofol by Dr. Jenetta Downer in near future: the risks, benefits, and alternatives have been discussed with the patient in detail. The patient states understanding and desires to proceed. ASA 3 Hold Iron for 10 days. Continue pantoprazole 40 mg twice daily.  Continue Linzess 290 mcg daily Follow up in 2-3 months.    Venetia Night, MSN, FNP-BC, AGACNP-BC [facility]  I have reviewed the note and agree with the APP's assessment as described in this progress note.  We will explore the patient's symptoms further with an EGD.  May need to discuss in the future again a colonoscopy as he has presented chronic constipation and he has never had a colonoscopy in the past.  Maylon Peppers, MD [facility]

## 2021-11-15 NOTE — Patient Instructions (Addendum)
We are scheduling you for an upper endoscopy in the near future with Dr. Jenetta Downer.  You need to hold your iron for 10 days prior to the procedure.  You will receive separate written instructions in the mail.  Please continue to taking your pantoprazole 40 mg twice daily. Follow a GERD diet:  Avoid fried, fatty, greasy, spicy, citrus foods. Avoid caffeine and carbonated beverages. Avoid chocolate. Try eating 4-6 small meals a day rather than 3 large meals. Do not eat within 3 hours of laying down. Prop head of bed up on wood or bricks to create a 6 inch incline.   Please continue taking your over-the-counter stool softener, MiraLAX, and your Linzess 290 mcg daily.  It was a pleasure to see you today. I want to create trusting relationships with patients. If you receive a survey regarding your visit,  I greatly appreciate you taking time to fill this out on paper or through your MyChart. I value your feedback.  Venetia Night, MSN, FNP-BC, AGACNP-BC [facility]

## 2021-11-15 NOTE — Telephone Encounter (Signed)
I tried to scheduled this pt for his EGD and I told him that he would get a pre-op visit. He states that he wasn't going to go through all this mess just to get the procedure done and he left. FYI

## 2021-11-16 ENCOUNTER — Telehealth: Payer: Self-pay

## 2021-11-16 NOTE — Telephone Encounter (Signed)
Patient call to request referral to pain management , his current one in [facility] is closing in December 2023 and will need new referral to [facility] in [place]

## 2021-11-17 ENCOUNTER — Other Ambulatory Visit: Payer: Self-pay | Admitting: Nurse Practitioner

## 2021-11-17 DIAGNOSIS — M545 Low back pain, unspecified: Secondary | ICD-10-CM

## 2021-11-24 ENCOUNTER — Telehealth (INDEPENDENT_AMBULATORY_CARE_PROVIDER_SITE_OTHER): Payer: Self-pay

## 2021-11-24 NOTE — Telephone Encounter (Signed)
I have called numerous times to get Mr Adrian Bird scheduled for a EGD with Dr Jenetta Downer, he has refused to schedule every single time ive called I'm adding this message in his chart patient has refused EGD at this time.

## 2021-11-25 ENCOUNTER — Other Ambulatory Visit: Payer: Self-pay | Admitting: Nurse Practitioner

## 2021-12-01 ENCOUNTER — Other Ambulatory Visit: Payer: Self-pay | Admitting: Nurse Practitioner

## 2021-12-15 ENCOUNTER — Encounter: Payer: Self-pay | Admitting: *Deleted

## 2021-12-15 NOTE — Telephone Encounter (Signed)
He is scheduled for 01/27/22 with Dr.Carver

## 2021-12-16 ENCOUNTER — Encounter: Payer: Self-pay | Admitting: *Deleted

## 2021-12-16 NOTE — Telephone Encounter (Signed)
Sorry Dr. Levon Hedger

## 2021-12-23 ENCOUNTER — Other Ambulatory Visit: Payer: Self-pay | Admitting: Nurse Practitioner

## 2022-01-05 ENCOUNTER — Other Ambulatory Visit: Payer: Self-pay | Admitting: Nurse Practitioner

## 2022-01-06 ENCOUNTER — Other Ambulatory Visit: Payer: Self-pay

## 2022-01-06 DIAGNOSIS — J439 Emphysema, unspecified: Secondary | ICD-10-CM

## 2022-01-06 MED ORDER — ANORO ELLIPTA 62.5-25 MCG/ACT IN AEPB: INHALATION_SPRAY | RESPIRATORY_TRACT | 2 refills | Status: DC

## 2022-01-19 ENCOUNTER — Other Ambulatory Visit (HOSPITAL_COMMUNITY): Payer: Self-pay

## 2022-01-19 ENCOUNTER — Ambulatory Visit (HOSPITAL_COMMUNITY)
Admission: RE | Admit: 2022-01-19 | Discharge: 2022-01-19 | Disposition: A | Discharge status: Home / Self Care | Payer: Medicare Other | Source: Ambulatory Visit

## 2022-01-19 ENCOUNTER — Other Ambulatory Visit: Payer: Self-pay

## 2022-01-19 DIAGNOSIS — M5459 Other low back pain: Secondary | ICD-10-CM | POA: Insufficient documentation

## 2022-01-19 DIAGNOSIS — G8929 Other chronic pain: Secondary | ICD-10-CM

## 2022-01-19 MED ORDER — GABAPENTIN 300 MG PO CAPS: 300.0000 mg | ORAL_CAPSULE | Freq: Two times a day (BID) | ORAL | 1 refills | Status: DC

## 2022-01-19 MED ORDER — MONTELUKAST SODIUM 10 MG PO TABS: 10.0000 mg | ORAL_TABLET | Freq: Every day | ORAL | 0 refills | Status: DC

## 2022-01-19 MED ORDER — TAMSULOSIN HCL 0.4 MG PO CAPS: 0.4000 mg | ORAL_CAPSULE | Freq: Every day | ORAL | 0 refills | Status: DC

## 2022-01-19 MED ORDER — FLUOXETINE HCL 10 MG PO CAPS: 10.0000 mg | ORAL_CAPSULE | Freq: Every day | ORAL | 0 refills | Status: DC

## 2022-01-19 MED ORDER — FAMOTIDINE 20 MG PO TABS: ORAL_TABLET | ORAL | 0 refills | Status: DC

## 2022-01-19 MED ORDER — LISINOPRIL 20 MG PO TABS: 20.0000 mg | ORAL_TABLET | Freq: Every morning | ORAL | 0 refills | Status: DC

## 2022-01-19 MED ORDER — DILTIAZEM HCL ER COATED BEADS 120 MG PO CP24: 120.0000 mg | ORAL_CAPSULE | Freq: Every day | ORAL | 0 refills | Status: DC

## 2022-01-19 MED ORDER — EZETIMIBE 10 MG PO TABS: ORAL_TABLET | ORAL | 0 refills | Status: DC

## 2022-01-20 ENCOUNTER — Other Ambulatory Visit (HOSPITAL_COMMUNITY)
Admission: RE | Admit: 2022-01-20 | Discharge: 2022-01-20 | Disposition: A | Discharge status: Home / Self Care | Payer: Medicare Other | Source: Ambulatory Visit | Attending: Family Medicine | Admitting: Family Medicine

## 2022-01-20 ENCOUNTER — Other Ambulatory Visit: Payer: Self-pay | Admitting: Family Medicine

## 2022-01-20 DIAGNOSIS — R1084 Generalized abdominal pain: Secondary | ICD-10-CM | POA: Diagnosis present

## 2022-01-20 LAB — CBC WITH DIFFERENTIAL/PLATELET
Abs Immature Granulocytes: 0.02 10*3/uL (ref 0.00–0.07)
Basophils Absolute: 0.1 10*3/uL (ref 0.0–0.1)
Basophils Relative: 1 %
Eosinophils Absolute: 0.2 10*3/uL (ref 0.0–0.5)
Eosinophils Relative: 3 %
HCT: 44.4 % (ref 39.0–52.0)
Hemoglobin: 14.9 g/dL (ref 13.0–17.0)
Immature Granulocytes: 0 %
Lymphocytes Relative: 19 %
Lymphs Abs: 1.4 10*3/uL (ref 0.7–4.0)
MCH: 31 pg (ref 26.0–34.0)
MCHC: 33.6 g/dL (ref 30.0–36.0)
MCV: 92.5 fL (ref 80.0–100.0)
Monocytes Absolute: 1.1 10*3/uL — ABNORMAL HIGH (ref 0.1–1.0)
Monocytes Relative: 15 %
Neutro Abs: 4.5 10*3/uL (ref 1.7–7.7)
Neutrophils Relative %: 62 %
Platelets: 245 10*3/uL (ref 150–400)
RBC: 4.8 MIL/uL (ref 4.22–5.81)
RDW: 13.8 % (ref 11.5–15.5)
WBC: 7.3 10*3/uL (ref 4.0–10.5)
nRBC: 0 % (ref 0.0–0.2)

## 2022-01-20 LAB — COMPREHENSIVE METABOLIC PANEL
ALT: 25 U/L (ref 0–44)
AST: 22 U/L (ref 15–41)
Albumin: 4.2 g/dL (ref 3.5–5.0)
Alkaline Phosphatase: 92 U/L (ref 38–126)
Anion gap: 7 (ref 5–15)
BUN: 8 mg/dL (ref 8–23)
CO2: 27 mmol/L (ref 22–32)
Calcium: 9 mg/dL (ref 8.9–10.3)
Chloride: 97 mmol/L — ABNORMAL LOW (ref 98–111)
Creatinine, Ser: 0.57 mg/dL — ABNORMAL LOW (ref 0.61–1.24)
GFR, Estimated: >60 mL/min (ref >60)
Glucose, Bld: 89 mg/dL (ref 70–99)
Potassium: 4.1 mmol/L (ref 3.5–5.1)
Sodium: 131 mmol/L — ABNORMAL LOW (ref 135–145)
Total Bilirubin: 0.5 mg/dL (ref 0.3–1.2)
Total Protein: 7 g/dL (ref 6.5–8.1)

## 2022-01-20 LAB — LIPASE, BLOOD: Lipase: 27 U/L (ref 11–51)

## 2022-01-20 LAB — AMYLASE: Amylase: 50 U/L (ref 28–100)

## 2022-01-20 LAB — TROPONIN I (HIGH SENSITIVITY): Troponin I (High Sensitivity): 3 ng/L (ref <18)

## 2022-01-20 LAB — FERRITIN: Ferritin: 19 ng/mL — ABNORMAL LOW (ref 24–336)

## 2022-01-21 ENCOUNTER — Other Ambulatory Visit: Payer: Self-pay

## 2022-01-21 MED ORDER — ATORVASTATIN CALCIUM 80 MG PO TABS: ORAL_TABLET | ORAL | 0 refills | Status: DC

## 2022-01-21 MED ORDER — CARBAMAZEPINE 200 MG PO TABS: 200.0000 mg | ORAL_TABLET | Freq: Three times a day (TID) | ORAL | 0 refills | Status: DC

## 2022-01-25 ENCOUNTER — Encounter (HOSPITAL_COMMUNITY): Payer: Self-pay

## 2022-01-25 ENCOUNTER — Telehealth (INDEPENDENT_AMBULATORY_CARE_PROVIDER_SITE_OTHER): Payer: Self-pay | Admitting: *Deleted

## 2022-01-25 ENCOUNTER — Other Ambulatory Visit: Payer: Self-pay

## 2022-01-25 ENCOUNTER — Encounter (HOSPITAL_COMMUNITY)
Admission: RE | Admit: 2022-01-25 | Discharge: 2022-01-25 | Disposition: A | Discharge status: Home / Self Care | Payer: Medicare Other | Source: Ambulatory Visit | Attending: Gastroenterology | Admitting: Gastroenterology

## 2022-01-25 NOTE — Telephone Encounter (Signed)
PA done via [facility] for EGD. Auth#  [id number], DOS:   Jan 25, 2022 - Feb 06, 2022

## 2022-01-27 ENCOUNTER — Encounter (HOSPITAL_COMMUNITY)
Admission: RE | Disposition: A | Discharge status: Home / Self Care | Payer: Self-pay | Source: Home / Self Care | Attending: Gastroenterology

## 2022-01-27 ENCOUNTER — Ambulatory Visit (HOSPITAL_COMMUNITY)
Admission: RE | Admit: 2022-01-27 | Discharge: 2022-01-27 | Disposition: A | Discharge status: Home / Self Care | Payer: Medicare Other | Attending: Gastroenterology | Admitting: Gastroenterology

## 2022-01-27 ENCOUNTER — Ambulatory Visit (HOSPITAL_BASED_OUTPATIENT_CLINIC_OR_DEPARTMENT_OTHER): Payer: Medicare Other | Admitting: Certified Registered Nurse Anesthetist

## 2022-01-27 ENCOUNTER — Encounter (HOSPITAL_COMMUNITY): Payer: Self-pay | Admitting: Gastroenterology

## 2022-01-27 ENCOUNTER — Ambulatory Visit (HOSPITAL_COMMUNITY): Payer: Medicare Other | Admitting: Certified Registered Nurse Anesthetist

## 2022-01-27 DIAGNOSIS — K259 Gastric ulcer, unspecified as acute or chronic, without hemorrhage or perforation: Secondary | ICD-10-CM | POA: Diagnosis not present

## 2022-01-27 DIAGNOSIS — G40909 Epilepsy, unspecified, not intractable, without status epilepticus: Secondary | ICD-10-CM | POA: Insufficient documentation

## 2022-01-27 DIAGNOSIS — F32A Depression, unspecified: Secondary | ICD-10-CM | POA: Insufficient documentation

## 2022-01-27 DIAGNOSIS — K219 Gastro-esophageal reflux disease without esophagitis: Secondary | ICD-10-CM

## 2022-01-27 DIAGNOSIS — G4730 Sleep apnea, unspecified: Secondary | ICD-10-CM | POA: Insufficient documentation

## 2022-01-27 DIAGNOSIS — R1013 Epigastric pain: Secondary | ICD-10-CM

## 2022-01-27 DIAGNOSIS — F17210 Nicotine dependence, cigarettes, uncomplicated: Secondary | ICD-10-CM | POA: Insufficient documentation

## 2022-01-27 DIAGNOSIS — F17290 Nicotine dependence, other tobacco product, uncomplicated: Secondary | ICD-10-CM | POA: Insufficient documentation

## 2022-01-27 DIAGNOSIS — J45909 Unspecified asthma, uncomplicated: Secondary | ICD-10-CM | POA: Diagnosis not present

## 2022-01-27 DIAGNOSIS — I10 Essential (primary) hypertension: Secondary | ICD-10-CM | POA: Insufficient documentation

## 2022-01-27 HISTORY — PX: BIOPSY: SHX5522

## 2022-01-27 HISTORY — PX: ESOPHAGOGASTRODUODENOSCOPY (EGD) WITH PROPOFOL: SHX5813

## 2022-01-27 LAB — GLUCOSE, CAPILLARY: Glucose-Capillary: 89 mg/dL (ref 70–99)

## 2022-01-27 SURGERY — ESOPHAGOGASTRODUODENOSCOPY (EGD) WITH PROPOFOL
Anesthesia: General

## 2022-01-27 MED ORDER — OMEPRAZOLE 40 MG PO CPDR: 40.0000 mg | DELAYED_RELEASE_CAPSULE | Freq: Two times a day (BID) | ORAL | 1 refills | Status: DC

## 2022-01-27 MED ORDER — LIDOCAINE HCL (CARDIAC) PF 100 MG/5ML IV SOSY
PREFILLED_SYRINGE | INTRAVENOUS | Status: DC | PRN
  Administered 2022-01-27: 25 mg via INTRAVENOUS

## 2022-01-27 MED ORDER — PROPOFOL 10 MG/ML IV BOLUS
INTRAVENOUS | Status: DC | PRN
  Administered 2022-01-27: 100 mg via INTRAVENOUS

## 2022-01-27 MED ORDER — PROPOFOL 500 MG/50ML IV EMUL
INTRAVENOUS | Status: AC
  Filled 2022-01-27: qty 50

## 2022-01-27 MED ORDER — LACTATED RINGERS IV SOLN: INTRAVENOUS | Status: DC

## 2022-01-27 MED ORDER — PROPOFOL 500 MG/50ML IV EMUL
INTRAVENOUS | Status: DC | PRN
  Administered 2022-01-27: 200 ug/kg/min via INTRAVENOUS

## 2022-01-27 NOTE — Anesthesia Preprocedure Evaluation (Signed)
Anesthesia Evaluation  Patient identified by MRN, date of birth, ID band Patient awake    Reviewed: Allergy & Precautions, H&P , NPO status , Patient's Chart, lab work & pertinent test results, reviewed documented beta blocker date and time   Airway Mallampati: II  TM Distance: >3 FB Neck ROM: full    Dental no notable dental hx.    Pulmonary sleep apnea , COPD, Current Smoker   Pulmonary exam normal breath sounds clear to auscultation       Cardiovascular Exercise Tolerance: Good hypertension, negative cardio ROS  Rhythm:regular Rate:Normal     Neuro/Psych Seizures -,  PSYCHIATRIC DISORDERS Anxiety Depression       GI/Hepatic Neg liver ROS,GERD  Medicated,,  Endo/Other  negative endocrine ROS    Renal/GU negative Renal ROS  negative genitourinary   Musculoskeletal   Abdominal   Peds  Hematology negative hematology ROS (+)   Anesthesia Other Findings   Reproductive/Obstetrics negative OB ROS                             Anesthesia Physical Anesthesia Plan  ASA: 2  Anesthesia Plan: General   Post-op Pain Management:    Induction:   PONV Risk Score and Plan: Propofol infusion  Airway Management Planned:   Additional Equipment:   Intra-op Plan:   Post-operative Plan:   Informed Consent: I have reviewed the patients History and Physical, chart, labs and discussed the procedure including the risks, benefits and alternatives for the proposed anesthesia with the patient or authorized representative who has indicated his/her understanding and acceptance.     Dental Advisory Given  Plan Discussed with: CRNA  Anesthesia Plan Comments:        Anesthesia Quick Evaluation

## 2022-01-27 NOTE — Transfer of Care (Signed)
Immediate Anesthesia Transfer of Care Note  Patient: Adrian Bird  Procedure(s) Performed: ESOPHAGOGASTRODUODENOSCOPY (EGD) WITH PROPOFOL BIOPSY  Patient Location: PACU  Anesthesia Type:General  Level of Consciousness: awake, alert , and oriented  Airway & Oxygen Therapy: Patient Spontanous Breathing  Post-op Assessment: Report given to RN, Post -op Vital signs reviewed and stable, Patient moving all extremities X 4, and Patient able to stick tongue midline  Post vital signs: Reviewed  Last Vitals:  Vitals Value Taken Time  BP 153/77   Temp 97.5   Pulse 78   Resp 23   SpO2 92     Last Pain:  Vitals:   01/27/22 0945  TempSrc:   PainSc: 5          Complications: No notable events documented.

## 2022-01-27 NOTE — Op Note (Signed)
[facility] Patient Name: Adrian Bird Procedure Date: 01/27/2022 9:55 AM MRN: [id number] Date of Birth: 06-14-1955 Attending MD: Katrinka Blazing , , [id number] CSN: [id number] Age: 66 Admit Type: Inpatient Procedure:                Upper GI endoscopy Indications:              Epigastric abdominal pain Providers:                Katrinka Blazing, Edrick Kins, RN, Pandora Leiter,                            Technician Referring MD:              Medicines:                Monitored Anesthesia Care Complications:            No immediate complications. Estimated Blood Loss:     Estimated blood loss: none. Procedure:                Pre-Anesthesia Assessment:                           - Prior to the procedure, a History and Physical                            was performed, and patient medications, allergies                            and sensitivities were reviewed. The patient's                            tolerance of previous anesthesia was reviewed.                           - The risks and benefits of the procedure and the                            sedation options and risks were discussed with the                            patient. All questions were answered and informed                            consent was obtained.                           - ASA Grade Assessment: II - A patient with mild                            systemic disease.                           After obtaining informed consent, the endoscope was                            passed under direct vision. Throughout the  procedure, the patient's blood pressure, pulse, and                            oxygen saturations were monitored continuously. The                            upper GI endoscopy was accomplished without                            difficulty. The patient tolerated the procedure                            well. The GIF-H190 ([id number]) scope was introduced                             through the mouth, and advanced to the second part                            of duodenum. Findings:      The examined esophagus was normal.      The gastroesophageal flap valve was visualized endoscopically and       classified as Hill Grade II (fold present, opens with respiration).      One non-bleeding superficial gastric ulcer with a clean ulcer base       (Forrest Class III) was found in the gastric antrum. The lesion was 10       mm in largest dimension. Biopsies from ulcer edge and stomach were taken       with a cold forceps for Helicobacter pylori testing and histology.      The examined duodenum was normal. Impression:               - Normal esophagus.                           - Non-bleeding gastric ulcer with a clean ulcer                            base (Forrest Class III). Biopsied.                           - Normal examined duodenum. Moderate Sedation:      Per Anesthesia Care Recommendation:           - Discharge patient to home (ambulatory).                           - Resume previous diet.                           - Prilosec (omeprazole) 40 mg PO BID.                           - Continue sucralfate 4 times a day for one more                            month.                           -  Await pathology results.                           - No ibuprofen, naproxen, or other non-steroidal                            anti-inflammatory drugs.                           - Smoking cessation.                           - Discuss screening colonoscopy in follow up                            appointment.                           - Repeat upper endoscopy in 3 months for                            surveillance. Procedure Code(s):        --- Professional ---                           [phone], Esophagogastroduodenoscopy, flexible,                            transoral; with biopsy, single or multiple Diagnosis Code(s):        --- Professional ---                            K25.9, Gastric ulcer, unspecified as acute or                            chronic, without hemorrhage or perforation                           R10.13, Epigastric pain CPT copyright 2022 American Medical Association. All rights reserved. The codes documented in this report are preliminary and upon coder review may  be revised to meet current compliance requirements. Katrinka Blazing, MD Katrinka Blazing,  01/27/2022 10:12:40 AM This report has been signed electronically. Number of Addenda: 0

## 2022-01-27 NOTE — H&P (Signed)
Adrian Bird is an 66 y.o. male.   Chief Complaint: Abdominal pain HPI: 66 year old male with past medical history of depression, epilepsy, hypertension, asthma and GERD, who comes to the hospital for epigastric abdominal pain.  Patient has presented epigastric abdominal pain intermittently for the last year.  States that he does not feel any major improvement with the use of PPI twice a day.  Denies any nausea, vomiting, fever, chills.  No melena or hematochezia.  Notably, he has never had a colonoscopy in the past.  Most recent CT of the abdomen and pelvis with IV contrast on 12/23/2020 showed distended gallbladder with cholelithiasis.  Past Medical History:  Diagnosis Date   Acid reflux    Asthma    Depression    Epilepsy ([place])    High blood pressure    Urticaria     Past Surgical History:  Procedure Laterality Date   CYST REMOVAL NECK     CYST REMOVAL TRUNK     EYE SURGERY Bilateral    HERNIA REPAIR     KNEE SURGERY Left    TONSILLECTOMY      Family History  Problem Relation Age of Onset   Congestive Heart Failure Mother    Lung cancer Father    Diabetes Sister    Diabetes Brother    Diabetes Maternal Aunt    Lung cancer Maternal Grandfather    Social History:  reports that he has been smoking e-cigarettes and cigarettes. He has a 12.50 pack-year smoking history. He has never used smokeless tobacco. He reports that he does not drink alcohol and does not use drugs.  Allergies:  Allergies  Allergen Reactions   Cephalexin Hives   Doxycycline Hives   Penicillins Hives   Sulfa Antibiotics Hives    Medications Prior to Admission  Medication Sig Dispense Refill   albuterol (VENTOLIN HFA) 108 (90 Base) MCG/ACT inhaler INHALE 2 PUFFS EVERY 6 HOURS AS NEEDED FOR WHEEZING. 6.7 g 0   aspirin EC 81 MG tablet Take 81 mg by mouth daily.     atorvastatin (LIPITOR) 80 MG tablet TAKE 1 TABLET BY MOUTH AT BEDTIME. 90 tablet 0   busPIRone (BUSPAR) 5 MG tablet Take 1  tablet (5 mg total) by mouth 2 (two) times daily. 180 tablet 0   carbamazepine (TEGRETOL) 200 MG tablet Take 1 tablet (200 mg total) by mouth 3 (three) times daily. (Patient taking differently: Take 6 mg by mouth 3 (three) times daily.) 270 tablet 0   diltiazem (CARDIZEM CD) 120 MG 24 hr capsule Take 1 capsule (120 mg total) by mouth daily. 90 capsule 0   docusate sodium (COLACE) 100 MG capsule Take 100 mg by mouth daily.     ezetimibe (ZETIA) 10 MG tablet TAKE 1 TABLET BY MOUTH AT BEDTIME. 90 tablet 0   famotidine (PEPCID) 20 MG tablet TAKE (1) TABLET TWICE DAILY. 180 tablet 0   FLUoxetine (PROZAC) 10 MG capsule Take 1 capsule (10 mg total) by mouth daily. 90 capsule 0   gabapentin (NEURONTIN) 300 MG capsule Take 1 capsule (300 mg total) by mouth 2 (two) times daily. 90 capsule 1   levETIRAcetam (KEPPRA) 750 MG tablet TAKE (1) TABLET TWICE DAILY. (Patient taking differently: Take 750 mg by mouth daily.) 56 tablet 0   lidocaine-prilocaine (EMLA) cream Apply 1 Application topically in the morning, at noon, in the evening, and at bedtime.     LINZESS 290 MCG CAPS capsule TAKE 1 CAPSULE BY MOUTH EVERY MORNING. 30 capsule 0  lisinopril (ZESTRIL) 20 MG tablet Take 1 tablet (20 mg total) by mouth every morning. 90 tablet 0   montelukast (SINGULAIR) 10 MG tablet Take 1 tablet (10 mg total) by mouth at bedtime. 90 tablet 0   Multiple Vitamin (MULTIVITAMIN WITH MINERALS) TABS tablet Take 1 tablet by mouth daily.     NON FORMULARY Pt uses a cpap nightly     omeprazole (PRILOSEC) 40 MG capsule Take 40 mg by mouth 2 (two) times daily.     Oxycodone HCl 10 MG TABS Take 10 mg by mouth in the morning, at noon, in the evening, and at bedtime.     pantoprazole (PROTONIX) 40 MG tablet Take 1 tablet (40 mg total) by mouth daily. 30 tablet 3   polyethylene glycol (MIRALAX / GLYCOLAX) 17 g packet Take 17 g by mouth daily as needed for moderate constipation.     sodium chloride (OCEAN) 0.65 % SOLN nasal spray Place 1  spray into both nostrils as needed for congestion. 30 mL 0   sucralfate (CARAFATE) 1 GM/10ML suspension TAKE 10 MLS (1 GRAM TOTAL) BY MOUTH 4 TIMES DAILY WITH MEALS AND AT BEDTIME. 414 mL 0   tamsulosin (FLOMAX) 0.4 MG CAPS capsule Take 1 capsule (0.4 mg total) by mouth daily. 90 capsule 0   umeclidinium-vilanterol (ANORO ELLIPTA) 62.5-25 MCG/ACT AEPB INHALE 1 PUFF DAILY. 60 each 2   Accu-Chek Softclix Lancets lancets USE TO TEST BLOOD SUGAR AS DIRECTED BY YOUR PROVIDER. 100 each 0   Blood Glucose Monitoring Suppl (ACCU-CHEK GUIDE) w/Device KIT      Blood Glucose Monitoring Suppl (FIFTY50 GLUCOSE METER 2.0) w/Device KIT See admin instructions.     glucose blood test strip Used to check blood sugar 3 times daily.Marland Kitchen dx code: E11.9 100 each 12   Iron, Ferrous Sulfate, 325 (65 Fe) MG TABS Take 325 mg by mouth daily. 30 tablet 2    Results for orders placed or performed during the hospital encounter of 01/27/22 (from the past 48 hour(s))  Glucose, capillary     Status: None   Collection Time: 01/27/22  9:41 AM  Result Value Ref Range   Glucose-Capillary 89 70 - 99 mg/dL    Comment: Glucose reference range applies only to samples taken after fasting for at least 8 hours.   No results found.  Review of Systems  Constitutional: Negative.   HENT: Negative.    Eyes: Negative.   Respiratory: Negative.    Cardiovascular: Negative.   Gastrointestinal:  Positive for abdominal pain.  Endocrine: Negative.   Genitourinary: Negative.   Musculoskeletal: Negative.   Skin: Negative.   Allergic/Immunologic: Negative.   Neurological: Negative.   Hematological: Negative.   Psychiatric/Behavioral: Negative.      Blood pressure 139/80, pulse 60, temperature 98.4 F (36.9 C), temperature source Oral, resp. rate 13, height _0  (1.702 m), weight 83.5 kg, SpO2 97 %. Physical Exam  GENERAL: The patient is AO x3, in no acute distress. HEENT: Head is normocephalic and atraumatic. EOMI are intact. Mouth is  well hydrated and without lesions. NECK: Supple. No masses LUNGS: Clear to auscultation. No presence of rhonchi/wheezing/rales. Adequate chest expansion HEART: RRR, normal s1 and s2. ABDOMEN: Soft, nontender, no guarding, no peritoneal signs, and nondistended. BS +. No masses. EXTREMITIES: Without any cyanosis, clubbing, rash, lesions or edema. NEUROLOGIC: AOx3, no focal motor deficit. SKIN: no jaundice, no rashes  Assessment/Plan 66 year old male with past medical history of depression, epilepsy, hypertension, asthma and GERD, who comes to the hospital for  epigastric abdominal pain.  Will proceed with EGD.  Harvel Quale, MD 01/27/2022, 9:43 AM

## 2022-01-27 NOTE — Discharge Instructions (Addendum)
You are being discharged to home.  Resume your previous diet.  Take Prilosec (omeprazole) 40 mg by mouth twice a day.  Continue sucralfate 4 times a day for one more month. Smoking cessation. We are waiting for your pathology results.  Do not take any ibuprofen (including Advil, Motrin or Nuprin), naproxen, or other non-steroidal anti-inflammatory drugs.  Your physician has recommended a repeat upper endoscopy in three months for surveillance.

## 2022-01-28 LAB — SURGICAL PATHOLOGY

## 2022-01-28 NOTE — Anesthesia Postprocedure Evaluation (Signed)
Anesthesia Post Note  Patient: Adrian Bird  Procedure(s) Performed: ESOPHAGOGASTRODUODENOSCOPY (EGD) WITH PROPOFOL BIOPSY  Patient location during evaluation: Phase II Anesthesia Type: General Level of consciousness: awake Pain management: pain level controlled Vital Signs Assessment: post-procedure vital signs reviewed and stable Respiratory status: spontaneous breathing and respiratory function stable Cardiovascular status: blood pressure returned to baseline and stable Postop Assessment: no headache and no apparent nausea or vomiting Anesthetic complications: no Comments: Late entry   No notable events documented.   Last Vitals:  Vitals:   01/27/22 0920 01/27/22 1001  BP: 139/80 133/68  Pulse: 60 75  Resp: 13 18  Temp: 36.9 C (!) 36.4 C  SpO2: 97% 94%    Last Pain:  Vitals:   01/27/22 1001  TempSrc: Oral  PainSc: 0-No pain                 Windell Norfolk

## 2022-02-04 ENCOUNTER — Other Ambulatory Visit: Payer: Self-pay | Admitting: Family Medicine

## 2022-02-09 ENCOUNTER — Encounter (HOSPITAL_COMMUNITY): Payer: Self-pay | Admitting: Gastroenterology

## 2022-02-14 ENCOUNTER — Other Ambulatory Visit: Payer: Self-pay | Admitting: Family Medicine

## 2022-02-17 ENCOUNTER — Other Ambulatory Visit: Payer: Self-pay | Admitting: Family Medicine

## 2022-02-22 ENCOUNTER — Other Ambulatory Visit: Payer: Self-pay | Admitting: Family Medicine

## 2022-02-22 DIAGNOSIS — M62830 Muscle spasm of back: Secondary | ICD-10-CM | POA: Diagnosis not present

## 2022-02-22 DIAGNOSIS — G894 Chronic pain syndrome: Secondary | ICD-10-CM | POA: Diagnosis not present

## 2022-02-22 DIAGNOSIS — Z79891 Long term (current) use of opiate analgesic: Secondary | ICD-10-CM | POA: Diagnosis not present

## 2022-02-22 DIAGNOSIS — M5459 Other low back pain: Secondary | ICD-10-CM | POA: Diagnosis not present

## 2022-02-22 DIAGNOSIS — M5136 Other intervertebral disc degeneration, lumbar region: Secondary | ICD-10-CM | POA: Diagnosis not present

## 2022-02-22 DIAGNOSIS — Z79899 Other long term (current) drug therapy: Secondary | ICD-10-CM | POA: Diagnosis not present

## 2022-02-23 DIAGNOSIS — G4733 Obstructive sleep apnea (adult) (pediatric): Secondary | ICD-10-CM | POA: Diagnosis not present

## 2022-03-02 ENCOUNTER — Ambulatory Visit (INDEPENDENT_AMBULATORY_CARE_PROVIDER_SITE_OTHER): Payer: 59 | Admitting: Gastroenterology

## 2022-03-02 ENCOUNTER — Encounter: Payer: Self-pay | Admitting: Gastroenterology

## 2022-03-02 VITALS — BP 148/75 | HR 71 | Temp 97.2°F | Ht 68.0 in | Wt 188.2 lb

## 2022-03-02 DIAGNOSIS — K279 Peptic ulcer, site unspecified, unspecified as acute or chronic, without hemorrhage or perforation: Secondary | ICD-10-CM | POA: Diagnosis not present

## 2022-03-02 DIAGNOSIS — K219 Gastro-esophageal reflux disease without esophagitis: Secondary | ICD-10-CM

## 2022-03-02 DIAGNOSIS — Z1211 Encounter for screening for malignant neoplasm of colon: Secondary | ICD-10-CM

## 2022-03-02 NOTE — Patient Instructions (Addendum)
Continue omeprazole 40 mg twice daily, 30 minutes prior to breakfast, and 30 minutes prior to dinner. Continue taking the liquid Carafate until you run out then stop.  Follow a GERD diet:  Avoid fried, fatty, greasy, spicy, citrus foods. Avoid caffeine and carbonated beverages. Avoid chocolate. Try eating 4-6 small meals a day rather than 3 large meals. Do not eat within 3 hours of laying down. Prop head of bed up on wood or bricks to create a 6 inch incline.  Continue daily MiraLAX, stool softener, and Linzess 290 mcg.  We will plan to schedule you for an upper endoscopy and colonoscopy in the near future with Dr. Jenetta Downer.  We will reach out to schedule you for the end of March/early April.  You will receive separate written instructions in the mail.  You will need to hold your iron for 10 days prior.  Please notify the office if you have any changes in your medications including starting on a blood thinner, diabetes medication, or you have any cardiac events or trouble breathing prior to being scheduled!  Please have your aide help you with your colonoscopy prep as there are instructions there for tips on making the prep easier to take and includes information regarding what a clear liquid diet consist of.  It was a pleasure to see you today. I want to create trusting relationships with patients. If you receive a survey regarding your visit,  I greatly appreciate you taking time to fill this out on paper or through your MyChart. I value your feedback.  Venetia Night, MSN, FNP-BC, AGACNP-BC [facility]

## 2022-03-02 NOTE — Progress Notes (Addendum)
GI Office Note    Referring Provider: No ref. provider found Primary Care Physician:  Tommie Sams, DO Primary Gastroenterologist: Dolores Frame, MD  Date:  03/02/2022  ID:  Adrian Bird, Adrian Bird Feb 04, 1956, MRN [id number]   Chief Complaint   Chief Complaint  Patient presents with   Follow-up    History of Present Illness  Adrian Bird is a 67 y.o. male with a history of asthma, depression, epilepsy, HTN, GERD presenting today for follow-up.  Initial consultation in November 2022 for midepigastric burning and acid reflux.  Previously failed famotidine 20 mg daily.  Trialed Protonix 40 mg daily and Carafate 1 g 4 times daily but reported no relief in symptoms.  Pain likely exacerbated by dietary choices.  Noted to have recent ED visit in November 2022 with RUQ US revealing mild wall thickening of the gallbladder, lithiasis and sludge, CBD measuring 6 mm.  Lipase also mildly elevated at that time.  Head CT without evidence of pancreatitis.  He was switched from pantoprazole to omeprazole 40 mg twice daily.  Subsequently in September 2023 his PCP switched him from omeprazole back to pantoprazole 40 mg daily.  Continuing to indulge in tomato-based products as well as greasy foods.  Last office visit 11/15/2021.  Patient reported improvement of symptoms since avoiding tomato products.  Was taking pantoprazole 40 mg twice daily.  Reported history of an ulcer on EGD about 10 years prior.  Denied any dysphagia, lack of appetite, early satiety.  EGD 01/27/2022: -Hill grade 2 gastroesophageal flap -Nonbleeding superficial gastric ulcer with clean ulcer base in the gastric antrum s/p biopsy (10 mm at largest dimension) -Normal duodenum -Advised to avoid NSAIDs, tobacco cessation -Continue omeprazole 40 mg twice daily -Advised Carafate 1 g 4 times daily for 1 month -Repeat EGD in 3 months for surveillance.  Today:  Reports he was having 3 days of liquids for 3 days  after his procedure and does not want to have it done again. Now he is able to eat well since his procedure. Has gained some weight. No nausea. No more burning at this time. At some hot sausages last night - has not had it in a while and was craving them. Trying to stay away from tomatoes as much as possible but he gets meals from meals on wheels and that is hard to avoid at times.   Has an aid that comes in 5 days a week to help with cleaning, meals, and laundry. Also helps wash his hair.   Patient reports he does not want to have a colonoscopy. Reports his wife had constipation after her colonoscopy and does not want to have one.  He repeats multiple times that he does not want a colonoscopy.  No brbpr, melena.   Having good BM with stool softener, miralax and linzess 290 mcg daily.   Did a Cologuard a few years ago. Reports it has been negative.     Current Outpatient Medications  Medication Sig Dispense Refill   Accu-Chek Softclix Lancets lancets USE TO TEST BLOOD SUGAR AS DIRECTED BY YOUR PROVIDER. 100 each 0   albuterol (VENTOLIN HFA) 108 (90 Base) MCG/ACT inhaler INHALE 2 PUFFS EVERY 6 HOURS AS NEEDED FOR WHEEZING. 6.7 g 0   aspirin EC 81 MG tablet Take 81 mg by mouth daily.     atorvastatin (LIPITOR) 80 MG tablet TAKE 1 TABLET BY MOUTH AT BEDTIME. 90 tablet 0   Blood Glucose Monitoring Suppl (ACCU-CHEK GUIDE) w/Device KIT  Blood Glucose Monitoring Suppl (FIFTY50 GLUCOSE METER 2.0) w/Device KIT See admin instructions.     busPIRone (BUSPAR) 5 MG tablet Take 1 tablet (5 mg total) by mouth 2 (two) times daily. 180 tablet 0   LINZESS 290 MCG CAPS capsule TAKE 1 CAPSULE BY MOUTH EVERY MORNING. 30 capsule 0   lisinopril (ZESTRIL) 20 MG tablet Take 1 tablet (20 mg total) by mouth every morning. 90 tablet 0   montelukast (SINGULAIR) 10 MG tablet Take 1 tablet (10 mg total) by mouth at bedtime. 90 tablet 0   Multiple Vitamin (MULTIVITAMIN WITH MINERALS) TABS tablet Take 1 tablet by mouth  daily.     NON FORMULARY Pt uses a cpap nightly     omeprazole (PRILOSEC) 40 MG capsule Take 1 capsule (40 mg total) by mouth 2 (two) times daily. 180 capsule 1   Oxycodone HCl 10 MG TABS Take 10 mg by mouth in the morning, at noon, in the evening, and at bedtime.     polyethylene glycol (MIRALAX / GLYCOLAX) 17 g packet Take 17 g by mouth daily as needed for moderate constipation.     sodium chloride (OCEAN) 0.65 % SOLN nasal spray Place 1 spray into both nostrils as needed for congestion. 30 mL 0   sucralfate (CARAFATE) 1 GM/10ML suspension TAKE 10 MLS (1 GRAM TOTAL) BY MOUTH 4 TIMES DAILY WITH MEALS AND AT BEDTIME. 414 mL 0   tamsulosin (FLOMAX) 0.4 MG CAPS capsule Take 1 capsule (0.4 mg total) by mouth daily. 90 capsule 0   umeclidinium-vilanterol (ANORO ELLIPTA) 62.5-25 MCG/ACT AEPB INHALE 1 PUFF DAILY. 60 each 2   carbamazepine (TEGRETOL) 200 MG tablet Take 1 tablet (200 mg total) by mouth 3 (three) times daily. (Patient taking differently: Take 6 mg by mouth 3 (three) times daily.) 270 tablet 0   diltiazem (CARDIZEM CD) 120 MG 24 hr capsule Take 1 capsule (120 mg total) by mouth daily. 90 capsule 0   docusate sodium (COLACE) 100 MG capsule Take 100 mg by mouth daily.     ezetimibe (ZETIA) 10 MG tablet TAKE 1 TABLET BY MOUTH AT BEDTIME. 90 tablet 0   famotidine (PEPCID) 20 MG tablet TAKE (1) TABLET TWICE DAILY. 180 tablet 0   FLUoxetine (PROZAC) 10 MG capsule Take 1 capsule (10 mg total) by mouth daily. 90 capsule 0   gabapentin (NEURONTIN) 300 MG capsule Take 1 capsule (300 mg total) by mouth 2 (two) times daily. 90 capsule 1   glucose blood test strip Used to check blood sugar 3 times daily.Marland Kitchen dx code: E11.9 100 each 12   Iron, Ferrous Sulfate, 325 (65 Fe) MG TABS Take 325 mg by mouth daily. 30 tablet 2   levETIRAcetam (KEPPRA) 750 MG tablet TAKE (1) TABLET TWICE DAILY. 180 tablet 0   lidocaine-prilocaine (EMLA) cream Apply 1 Application topically in the morning, at noon, in the evening, and  at bedtime.     No current facility-administered medications for this visit.    Past Medical History:  Diagnosis Date   Acid reflux    Asthma    Depression    Epilepsy ([place])    High blood pressure    Urticaria     Past Surgical History:  Procedure Laterality Date   BIOPSY  01/27/2022   Procedure: BIOPSY;  Surgeon: Montez Morita, Quillian Quince, MD;  [facility];  Service: Gastroenterology;;   CYST REMOVAL NECK     CYST REMOVAL TRUNK     ESOPHAGOGASTRODUODENOSCOPY (EGD) WITH PROPOFOL N/A 01/27/2022  Procedure: ESOPHAGOGASTRODUODENOSCOPY (EGD) WITH PROPOFOL;  Surgeon: Harvel Quale, MD;  Location: [facility];  Service: Gastroenterology;  Laterality: N/A;  1:30 pm, pt knows to arrive at 8:00   EYE SURGERY Bilateral    [place] Left    TONSILLECTOMY      Family History  Problem Relation Age of Onset   Congestive Heart Failure Mother    Lung cancer Father    Diabetes Sister    Diabetes Brother    Diabetes Maternal Aunt    Lung cancer Maternal Grandfather     Allergies as of 03/02/2022 - Review Complete 03/02/2022  Allergen Reaction Noted   Cephalexin Hives 03/24/2016   Doxycycline Hives 03/24/2016   Penicillins Hives 07/11/2016   Sulfa antibiotics Hives 10/03/2013    Social History   Socioeconomic History   Marital status: Married    Spouse name: Not on file   Number of children: Not on file   Years of education: Not on file   Highest education level: Not on file  Occupational History   Not on file  Tobacco Use   Smoking status: Every Day    Packs/day: 0.25    Years: 50.00    Total pack years: 12.50    Types: E-cigarettes, Cigarettes   Smokeless tobacco: Never  Vaping Use   Vaping Use: Some days  Substance and Sexual Activity   Alcohol use: No   Drug use: No   Sexual activity: Yes  Other Topics Concern   Not on file  Social History Narrative   Not on file   Social Determinants of Health   Financial  Resource Strain: Not on file  Food Insecurity: Not on file  Transportation Needs: Not on file  Physical Activity: Not on file  Stress: Not on file  Social Connections: Not on file     Review of Systems   Gen: Denies fever, chills, anorexia. Denies fatigue, weakness, weight loss.  CV: Denies chest pain, palpitations, syncope, peripheral edema, and claudication. Resp: Denies dyspnea at rest, cough, wheezing, coughing up blood, and pleurisy. GI: See HPI Derm: Denies rash, itching, dry skin Psych: Denies depression, anxiety, memory loss, confusion. No homicidal or suicidal ideation.  Heme: Denies bruising, bleeding, and enlarged lymph nodes.   Physical Exam   BP (!) 148/75 (BP Location: Right Arm, Patient Position: Sitting, Cuff Size: Normal)   Pulse 71   Temp (!) 97.2 F (36.2 C) (Temporal)   Ht 5\' 8"  (1.727 m)   Wt 188 lb 3.2 oz (85.4 kg)   SpO2 96%   BMI 28.62 kg/m   General:   Alert and oriented. No distress noted. Pleasant and cooperative.  Head:  Normocephalic and atraumatic. Eyes:  Conjuctiva clear without scleral icterus. Mouth:  Oral mucosa pink and moist. Good dentition. No lesions. Lungs:  Clear to auscultation bilaterally. No wheezes, rales, or rhonchi. No distress.  Heart:  S1, S2 present without murmurs appreciated.  Abdomen:  +BS, soft, non-tender and non-distended. No rebound or guarding. No HSM or masses noted. Rectal: deferred Msk:  Symmetrical without gross deformities. Normal posture. Extremities:  Without edema. Neurologic:  Alert and  oriented x4 Psych:  Alert and cooperative. Normal mood and affect.   Assessment  Adrian Bird is a 67 y.o. male with a history of asthma, depression, epilepsy, HTN, peptic ulcer disease presenting today for follow-up post procedure  GERD, peptic ulcer disease: Previously with acid reflux and epigastric burning.  Previously admitted to  frequent consumption of tomato based products and denies any NSAID use other  than 81 mg aspirin daily.  Reported history of ulcer in the past about 10 years ago.  Recently underwent EGD 01/27/2022 with nonbleeding superficial gastric ulcer.  Advised to continue omeprazole 40 mg twice daily and was given Carafate 1 g 4 times daily for 1 month.  Currently denies any burning, nausea, dysphagia, lack of appetite, or upper abdominal pain.  Reported for 3 days after procedure he continued to have pain and was not able to eat therefore he initially stated he did not want to have another upper endoscopy.  After further explanation and reassurance patient agreed to undergo repeat EGD for surveillance.  Advised him to complete current supply of Carafate and then stop but continue omeprazole 40 mg twice daily.  We discussed in detail GERD diet today however sometimes difficult for him to follow given he receives Meals on Wheels and commonly gets spaghetti and other pasta type meals with tomato-based sauce.  Screening for colon cancer: Initially stated he never wanted to have a colonoscopy as he reports his late wife had troubles with constipation after colonoscopy.  He states he has done Cologuard a few years ago and believes it was negative.  Denies any melena or BRBPR.  No other alarm symptoms present.  After further discussion and explanation regarding the procedure and the importance of monitoring with direct visualization of the colon to assess for colon cancer and then able to be performed under sedation he has agreed to undergo colonoscopy at time of repeat EGD.  We discussed the prep in detail today and advised him that he would have detailed instructions regarding his prep that he should have his home aide assist him in completing.  Advised him that we would not complete this until the end of March/early April once he is 3 months out from his prior EGD in late December.  PLAN    Continue omeprazole 40 mg BID Complete Carafate supply then stop altogether GERD diet/lifestyle  modifications Continue Linzess 290 mcg daily. Continue daily MiraLAX and stool softener Proceed with upper endoscopy and colonoscopy with propofol by Dr. Levon Hedger in near future: the risks, benefits, and alternatives have been discussed with the patient in detail. The patient states understanding and desires to proceed. ASA 3 (for late March/early April) Hold iron for 10 days prior Extra half day of clear liquids Follow-up to be determined after procedures    Brooke Bonito, MSN, FNP-BC, AGACNP-BC [facility]  I have reviewed the note and agree with the APP's assessment as described in this progress note  Katrinka Blazing, MD Gastroenterology and Hepatology [facility]

## 2022-03-07 ENCOUNTER — Other Ambulatory Visit: Payer: Self-pay | Admitting: Family Medicine

## 2022-03-07 DIAGNOSIS — J439 Emphysema, unspecified: Secondary | ICD-10-CM

## 2022-03-15 ENCOUNTER — Other Ambulatory Visit: Payer: Self-pay | Admitting: Family Medicine

## 2022-03-15 DIAGNOSIS — G8929 Other chronic pain: Secondary | ICD-10-CM

## 2022-03-17 ENCOUNTER — Telehealth: Payer: Self-pay | Admitting: Family Medicine

## 2022-03-17 MED ORDER — LINACLOTIDE 290 MCG PO CAPS: 290.0000 ug | ORAL_CAPSULE | Freq: Every morning | ORAL | 1 refills | Status: DC

## 2022-03-17 MED ORDER — SUCRALFATE 1 GM/10ML PO SUSP: ORAL | 1 refills | Status: DC

## 2022-03-17 NOTE — Telephone Encounter (Signed)
Pt requesting refill on Linzess and Carafate sent to [place]. Pt has appt in March. Pt last seen Leonna in October for Cough; seen in September for GERD. Please advise. Thank you

## 2022-03-17 NOTE — Telephone Encounter (Signed)
Scheduled appointment 04/12/22

## 2022-03-17 NOTE — Telephone Encounter (Signed)
Patient has appointment for 04/12/22 and requesting his medication be sent in almost out.

## 2022-03-17 NOTE — Telephone Encounter (Signed)
Schedule appointment 04/12/22

## 2022-03-17 NOTE — Telephone Encounter (Signed)
Refills sent to pharmacy. 

## 2022-03-17 NOTE — Addendum Note (Signed)
Addended by: Vicente Males on: 03/17/2022 02:02 PM   Modules accepted: Orders

## 2022-03-22 ENCOUNTER — Telehealth: Payer: Self-pay | Admitting: Family Medicine

## 2022-03-22 NOTE — Telephone Encounter (Signed)
Pt left voicemail on nurse line to have nurse return call. Nurse returned call. Pt is needing PCP to call [facility] to get new CPAP machine set up. Pt states his CPAP company has gone out of business and [facility] has taken it over.  [facility] Phone [phone] (Will need to get fax number when we call [facility])  Will call in the morning(03/23/21) to get information on what we need to send in.

## 2022-03-23 NOTE — Telephone Encounter (Signed)
Contacted local [facility] and they state we need to send 6 month chart note prior to use of CPAP and 6 months chart note of CPAP usage for compliance.  Contacted [phone] number and rep states that we would need office note within the past year stating that machine is broke or what is going on. Also note has to say if pt benefits from CPAP. Order will need to be sent for replacement CPAP(has to include humidifier). Also fax sleep study along with office note and script to [phone] (Sleep study and script written out and placed in yellow folder at nurses station)  Pt had appt on 04/12/22 but nurse moved up appt to 03/28/22 to get this taken care of. Pt contacted and made aware, verbalized understanding.

## 2022-03-24 ENCOUNTER — Telehealth: Payer: Self-pay | Admitting: *Deleted

## 2022-03-24 NOTE — Telephone Encounter (Signed)
Called pt to schedule EGD/TCS w/Dr.Castaneda ASA 3, pt says to hold off because he has some more doctors appointments coming up. Advised pt we will give him a call back.

## 2022-03-28 ENCOUNTER — Encounter: Payer: Self-pay | Admitting: Family Medicine

## 2022-03-28 ENCOUNTER — Ambulatory Visit (INDEPENDENT_AMBULATORY_CARE_PROVIDER_SITE_OTHER): Payer: 59 | Admitting: Family Medicine

## 2022-03-28 VITALS — BP 106/66 | HR 69 | Temp 98.1°F | Ht 68.0 in | Wt 190.0 lb

## 2022-03-28 DIAGNOSIS — J439 Emphysema, unspecified: Secondary | ICD-10-CM | POA: Diagnosis not present

## 2022-03-28 DIAGNOSIS — G4733 Obstructive sleep apnea (adult) (pediatric): Secondary | ICD-10-CM | POA: Diagnosis not present

## 2022-03-28 DIAGNOSIS — L989 Disorder of the skin and subcutaneous tissue, unspecified: Secondary | ICD-10-CM | POA: Diagnosis not present

## 2022-03-28 DIAGNOSIS — I10 Essential (primary) hypertension: Secondary | ICD-10-CM

## 2022-03-28 DIAGNOSIS — J4489 Other specified chronic obstructive pulmonary disease: Secondary | ICD-10-CM

## 2022-03-28 NOTE — Assessment & Plan Note (Signed)
Needs cryotherapy.  Referring to dermatology.

## 2022-03-28 NOTE — Assessment & Plan Note (Signed)
Will send in prescription for new CPAP machine and supplies to [facility].  Patient is compliant with therapy.  This therapy helps him tremendously.  Unfortunately, he does not have a working machine at this time.

## 2022-03-28 NOTE — Assessment & Plan Note (Signed)
Stable on lisinopril and diltiazem.  Continue.

## 2022-03-28 NOTE — Assessment & Plan Note (Signed)
Stable on Anoro.

## 2022-03-28 NOTE — Progress Notes (Signed)
Subjective:  Patient ID: Adrian Bird, male    DOB: July 18, 1955  Age: 67 y.o. MRN: [id number]  CC: Chief Complaint  Patient presents with   CPAP visit    CPAP Visit - is using brother's machine- Has nursing staff out to home twice per day- [facility]   shower chair    Order per case worker Angy Brinson BSW casworker due to weak back - is going to specialist     HPI:  67 year old male with an extensive past medical history presents for follow-up.  Patient states that his CPAP machine is no longer working.  He has been compliant with therapy and states that it helps him tremendously.  He is requesting that we send in another 1 to [facility] so that he may continue treatment.  Patient states that he does not have enough strips to check his blood sugars rectally.  I advised him that he is prediabetic at this time.  He does not need to check his blood sugars on a regular basis at this time.  He is on no pharmacotherapy regarding this.  Blood pressure is well-controlled on lisinopril and diltiazem.  Hyperlipidemia has been well-controlled.  Most recent LDL 60.  He is compliant with Lipitor.  Patient states that his COPD is stable.  He is on Anoro.  Patient reports that he has an area on his chest that he would like me to examine.  He has a raised area of concern.  Lastly, patient's caseworker has recommended that he have a shower chair to bed to help with bathing.   Patient Active Problem List   Diagnosis Date Noted   Skin lesion 03/28/2022   Polypharmacy 11/03/2021   Gastroesophageal reflux disease 11/03/2021   History of epilepsy 07/26/2021   OSA on CPAP 12/15/2020   Tobacco abuse 12/15/2020   COPD with chronic bronchitis and emphysema ([place]) 12/15/2020   Essential tremor 10/15/2020   Anxiety disorder 09/16/2019   Chronic low back pain 09/16/2019   Long-term current use of opiate analgesic 09/16/2019   Moderate recurrent major depression ([place]) 09/16/2019    Essential hypertension 08/20/2014   Arthritis 10/04/2012   Mixed hyperlipidemia 10/04/2012   Osteoporosis 10/04/2012    Social Hx   Social History   Socioeconomic History   Marital status: Married    Spouse name: Not on file   Number of children: Not on file   Years of education: Not on file   Highest education level: Not on file  Occupational History   Not on file  Tobacco Use   Smoking status: Every Day    Packs/day: 0.25    Years: 50.00    Total pack years: 12.50    Types: E-cigarettes, Cigarettes   Smokeless tobacco: Never  Vaping Use   Vaping Use: Some days  Substance and Sexual Activity   Alcohol use: No   Drug use: No   Sexual activity: Yes  Other Topics Concern   Not on file  Social History Narrative   Not on file   Social Determinants of Health   Financial Resource Strain: Not on file  Food Insecurity: Not on file  Transportation Needs: Not on file  Physical Activity: Not on file  Stress: Not on file  Social Connections: Not on file    Review of Systems Per HPI  Objective:  BP 106/66   Pulse 69   Temp 98.1 F (36.7 C)   Ht 5' 8"$  (1.727 m)   Wt 190 lb (86.2  kg)   SpO2 98%   BMI 28.89 kg/m      03/28/2022   10:47 AM 03/02/2022   10:35 AM 01/27/2022   10:01 AM  BP/Weight  Systolic BP [id number]  Diastolic BP 66 75 68  Wt. (Lbs) 190 188.2   BMI 28.89 kg/m2 28.62 kg/m2     Physical Exam Vitals and nursing note reviewed.  Constitutional:      General: He is not in acute distress.    Appearance: Normal appearance.  HENT:     Head: Normocephalic and atraumatic.  Eyes:     General:        Right eye: No discharge.        Left eye: No discharge.     Conjunctiva/sclera: Conjunctivae normal.  Cardiovascular:     Rate and Rhythm: Normal rate and regular rhythm.  Pulmonary:     Effort: Pulmonary effort is normal.     Breath sounds: Normal breath sounds. No wheezing, rhonchi or rales.  Abdominal:     General: There is no distension.      Palpations: Abdomen is soft.     Tenderness: There is no abdominal tenderness.  Skin:    Comments: Chest with a raised warty lesion.  Neurological:     Mental Status: He is alert.  Psychiatric:        Mood and Affect: Mood normal.        Behavior: Behavior normal.     Lab Results  Component Value Date   WBC 7.3 01/20/2022   HGB 14.9 01/20/2022   HCT 44.4 01/20/2022   PLT 245 01/20/2022   GLUCOSE 89 01/20/2022   CHOL 133 07/26/2021   TRIG 82 07/26/2021   HDL 57 07/26/2021   LDLCALC 60 07/26/2021   ALT 25 01/20/2022   AST 22 01/20/2022   NA 131 (L) 01/20/2022   K 4.1 01/20/2022   CL 97 (L) 01/20/2022   CREATININE 0.57 (L) 01/20/2022   BUN 8 01/20/2022   CO2 27 01/20/2022   HGBA1C 5.9 (H) 07/26/2021     Assessment & Plan:   Problem List Items Addressed This Visit       Cardiovascular and Mediastinum   Essential hypertension - Primary    Stable on lisinopril and diltiazem.  Continue.        Respiratory   OSA on CPAP    Will send in prescription for new CPAP machine and supplies to [facility].  Patient is compliant with therapy.  This therapy helps him tremendously.  Unfortunately, he does not have a working machine at this time.      COPD with chronic bronchitis and emphysema (HCC)    Stable on Anoro.        Musculoskeletal and Integument   Skin lesion    Needs cryotherapy.  Referring to dermatology.      Relevant Orders   Ambulatory referral to Dermatology   Follow-up:  Return in about 6 months (around 09/26/2022).  [place]

## 2022-03-28 NOTE — Patient Instructions (Addendum)
We will take care of your CPAP and referral to dermatology.  You don't need to check your blood sugar regularly.  Follow up in 6 months.

## 2022-03-29 DIAGNOSIS — M5459 Other low back pain: Secondary | ICD-10-CM | POA: Diagnosis not present

## 2022-03-29 DIAGNOSIS — M5136 Other intervertebral disc degeneration, lumbar region: Secondary | ICD-10-CM | POA: Diagnosis not present

## 2022-03-29 DIAGNOSIS — M62830 Muscle spasm of back: Secondary | ICD-10-CM | POA: Diagnosis not present

## 2022-03-29 DIAGNOSIS — Z79899 Other long term (current) drug therapy: Secondary | ICD-10-CM | POA: Diagnosis not present

## 2022-03-29 DIAGNOSIS — G894 Chronic pain syndrome: Secondary | ICD-10-CM | POA: Diagnosis not present

## 2022-03-30 ENCOUNTER — Encounter (INDEPENDENT_AMBULATORY_CARE_PROVIDER_SITE_OTHER): Payer: Self-pay | Admitting: *Deleted

## 2022-04-04 ENCOUNTER — Telehealth: Payer: Self-pay | Admitting: Family Medicine

## 2022-04-04 NOTE — Telephone Encounter (Signed)
Patient was seen 03/28/22 and he requested a refill on his C pap supplies at his appointment. He states was told they would be sent [facility] but still hasn't heard anything from them yet. Please advise

## 2022-04-05 NOTE — Telephone Encounter (Signed)
Script was faxed to [facility] 03/28/22 per note  Left message to return call with [facility] to check status of supply requests

## 2022-04-07 ENCOUNTER — Other Ambulatory Visit: Payer: Self-pay | Admitting: Family Medicine

## 2022-04-12 ENCOUNTER — Ambulatory Visit: Payer: 59 | Admitting: Family Medicine

## 2022-04-13 ENCOUNTER — Other Ambulatory Visit: Payer: Self-pay | Admitting: Family Medicine

## 2022-04-14 ENCOUNTER — Telehealth: Payer: Self-pay | Admitting: Family Medicine

## 2022-04-14 NOTE — Telephone Encounter (Signed)
Contacted Adrian Bird to schedule their annual wellness visit. Appointment made for 04/22/2022.  Thank you,  Colletta [place],  [place] [facility] Direct Dial ??[phone]

## 2022-04-21 NOTE — Telephone Encounter (Signed)
Patient stated he was going to call [facility] to heck on the status of his CPAP supplies and let us know if he needed anything else.

## 2022-04-22 ENCOUNTER — Ambulatory Visit (INDEPENDENT_AMBULATORY_CARE_PROVIDER_SITE_OTHER): Payer: 59

## 2022-04-22 VITALS — Ht 68.0 in | Wt 190.0 lb

## 2022-04-22 DIAGNOSIS — Z0000 Encounter for general adult medical examination without abnormal findings: Secondary | ICD-10-CM

## 2022-04-22 NOTE — Progress Notes (Signed)
I connected with  Adrian Bird on 04/22/22 by a audio enabled telemedicine application and verified that I am speaking with the correct person using two identifiers.  Patient Location: Home  Provider Location: Office/Clinic  I discussed the limitations of evaluation and management by telemedicine. The patient expressed understanding and agreed to proceed.

## 2022-04-22 NOTE — Progress Notes (Signed)
Subjective:   Adrian Bird is a 67 y.o. male who presents for an Initial Medicare Annual Wellness Visit.  Review of Systems     Cardiac Risk Factors include: advanced age (>99men, >24 women);dyslipidemia;male gender;hypertension;sedentary lifestyle;smoking/ tobacco exposure     Objective:    Today's Vitals   04/22/22 1557  Weight: 190 lb (86.2 kg)  Height: 5\' 8"  (1.727 m)   Body mass index is 28.89 kg/m.     04/22/2022    4:01 PM 01/25/2022    8:35 AM  Advanced Directives  Does Patient Have a Medical Advance Directive? No No  Would patient like information on creating a medical advance directive? No - Patient declined No - Patient declined    Current Medications (verified) Outpatient Encounter Medications as of 04/22/2022  Medication Sig   Accu-Chek Softclix Lancets lancets USE TO TEST BLOOD SUGAR AS DIRECTED BY YOUR PROVIDER.   albuterol (VENTOLIN HFA) 108 (90 Base) MCG/ACT inhaler INHALE 2 PUFFS EVERY 6 HOURS AS NEEDED FOR WHEEZING.   aspirin EC 81 MG tablet Take 81 mg by mouth daily.   atorvastatin (LIPITOR) 80 MG tablet TAKE 1 TABLET BY MOUTH AT BEDTIME.   busPIRone (BUSPAR) 5 MG tablet Take 1 tablet (5 mg total) by mouth 2 (two) times daily.   carbamazepine (TEGRETOL) 200 MG tablet TAKE 1 TABLET BY MOUTH 3 TIMES DAILY.   diltiazem (CARDIZEM CD) 120 MG 24 hr capsule TAKE 1 CAPSULE BY MOUTH ONCE DAILY.   docusate sodium (COLACE) 100 MG capsule Take 100 mg by mouth daily.   ezetimibe (ZETIA) 10 MG tablet TAKE 1 TABLET BY MOUTH AT BEDTIME.   famotidine (PEPCID) 20 MG tablet TAKE (1) TABLET TWICE DAILY.   FLUoxetine (PROZAC) 10 MG capsule TAKE 1 CAPSULE BY MOUTH ONCE DAILY.   gabapentin (NEURONTIN) 300 MG capsule TAKE 1 CAPSULE BY MOUTH TWICE DAILY.   Iron, Ferrous Sulfate, 325 (65 Fe) MG TABS Take 325 mg by mouth daily.   levETIRAcetam (KEPPRA) 750 MG tablet TAKE (1) TABLET TWICE DAILY.   linaclotide (LINZESS) 290 MCG CAPS capsule Take 1 capsule (290 mcg total)  by mouth every morning.   lisinopril (ZESTRIL) 20 MG tablet TAKE 1 TABLET BY MOUTH EVERY MORNING.   montelukast (SINGULAIR) 10 MG tablet TAKE 1 TABLET BY MOUTH AT BEDTIME.   Multiple Vitamin (MULTIVITAMIN WITH MINERALS) TABS tablet Take 1 tablet by mouth daily.   omeprazole (PRILOSEC) 40 MG capsule TAKE 1 CAPSULE BY MOUTH TWICE DAILY.   Oxycodone HCl 10 MG TABS Take 10 mg by mouth in the morning, at noon, in the evening, and at bedtime.   polyethylene glycol (MIRALAX / GLYCOLAX) 17 g packet Take 17 g by mouth daily as needed for moderate constipation.   sucralfate (CARAFATE) 1 GM/10ML suspension TAKE 10 MLS (1 GRAM TOTAL) BY MOUTH 4 TIMES DAILY WITH MEALS AND AT BEDTIME.   tamsulosin (FLOMAX) 0.4 MG CAPS capsule TAKE 1 CAPSULE BY MOUTH ONCE DAILY.   umeclidinium-vilanterol (ANORO ELLIPTA) 62.5-25 MCG/ACT AEPB USE (1) PUFF DAILY AS DIRECTED.   No facility-administered encounter medications on file as of 04/22/2022.    Allergies (verified) Cephalexin, Doxycycline, Penicillins, and Sulfa antibiotics   History: Past Medical History:  Diagnosis Date   Acid reflux    Asthma    Depression    Epilepsy ([place])    High blood pressure    Urticaria    Past Surgical History:  Procedure Laterality Date   BIOPSY  01/27/2022   Procedure: BIOPSY;  Surgeon:  Montez Morita, Quillian Quince, MD;  Location: [facility];  Service: Gastroenterology;;   CYST REMOVAL NECK     CYST REMOVAL TRUNK     ESOPHAGOGASTRODUODENOSCOPY (EGD) WITH PROPOFOL N/A 01/27/2022   Procedure: ESOPHAGOGASTRODUODENOSCOPY (EGD) WITH PROPOFOL;  Surgeon: Harvel Quale, MD;  Location: [facility];  Service: Gastroenterology;  Laterality: N/A;  1:30 pm, pt knows to arrive at 8:00   EYE SURGERY Bilateral    [place] Left    TONSILLECTOMY     Family History  Problem Relation Age of Onset   Congestive Heart Failure Mother    Lung cancer Father    Diabetes Sister    Diabetes Brother    Diabetes  Maternal Aunt    Lung cancer Maternal Grandfather    Social History   Socioeconomic History   Marital status: Married    Spouse name: Not on file   Number of children: Not on file   Years of education: Not on file   Highest education level: Not on file  Occupational History   Not on file  Tobacco Use   Smoking status: Every Day    Packs/day: 0.25    Years: 50.00    Additional pack years: 0.00    Total pack years: 12.50    Types: E-cigarettes, Cigarettes   Smokeless tobacco: Never  Vaping Use   Vaping Use: Some days  Substance and Sexual Activity   Alcohol use: No   Drug use: No   Sexual activity: Yes  Other Topics Concern   Not on file  Social History Narrative   Not on file   Social Determinants of Health   Financial Resource Strain: Low Risk  (04/22/2022)   Overall Financial Resource Strain (CARDIA)    Difficulty of Paying Living Expenses: Not hard at all  Food Insecurity: No Food Insecurity (04/22/2022)   Hunger Vital Sign    Worried About Running Out of Food in the Last Year: Never true    Ran Out of Food in the Last Year: Never true  Transportation Needs: No Transportation Needs (04/22/2022)   PRAPARE - Hydrologist (Medical): No    Lack of Transportation (Non-Medical): No  Physical Activity: Inactive (04/22/2022)   Exercise Vital Sign    Days of Exercise per Week: 0 days    Minutes of Exercise per Session: 0 min  Stress: No Stress Concern Present (04/22/2022)   [place]    Feeling of Stress : Only a little  Social Connections: Unknown (04/22/2022)   Social Connection and Isolation Panel [NHANES]    Frequency of Communication with Friends and Family: Three times a week    Frequency of Social Gatherings with Friends and Family: Once a week    Attends Religious Services: Never    Marine scientist or Organizations: No    Attends Music therapist:  Never    Marital Status: Patient declined    Tobacco Counseling Ready to quit: Not Answered Counseling given: Not Answered   Clinical Intake:  Pre-visit preparation completed: Yes  Pain : No/denies pain  Diabetes: No  How often do you need to have someone help you when you read instructions, pamphlets, or other written materials from your doctor or pharmacy?: 1 - Never  Diabetic?No   Interpreter Needed?: No  Information entered by :: Denman George LPN   Activities of Daily Living    04/22/2022  4:01 PM 01/25/2022    8:37 AM  In your present state of health, do you have any difficulty performing the following activities:  Hearing? 0   Vision? 0   Difficulty concentrating or making decisions? 0   Walking or climbing stairs? 1   Dressing or bathing? 0   Doing errands, shopping? 1 0  Preparing Food and eating ? N   Using the Toilet? N   In the past six months, have you accidently leaked urine? N   Do you have problems with loss of bowel control? N   Managing your Medications? Y   Managing your Finances? N   Housekeeping or managing your Housekeeping? Y     Patient Care Team: Coral Spikes, DO as PCP - General (Family Medicine)  Indicate any recent Medical Services you may have received from other than [facility] providers in the past year (date may be approximate).     Assessment:   This is a routine wellness examination for Adrian Bird.  Hearing/Vision screen Hearing Screening - Comments:: Denies hearing difficulties  Vision Screening - Comments:: No vision problems; will schedule routine eye exam soon    Dietary issues and exercise activities discussed: Current Exercise Habits: The patient does not participate in regular exercise at present   Goals Addressed             This Visit's Progress    Manage pain        Depression Screen    04/22/2022    3:59 PM 03/28/2022   10:58 AM 07/26/2021   10:27 AM  PHQ 2/9 Scores  PHQ - 2 Score 2 2 0  PHQ- 9 Score 5  5     Fall Risk    04/22/2022    3:58 PM 03/28/2022   10:58 AM 07/26/2021   10:27 AM  [place] in the past year? 0 0 0  Number falls in past yr: 0 0   Injury with Fall? 0 0   Risk for fall due to : Impaired balance/gait;Impaired mobility No Fall Risks No Fall Risks  Follow up Falls prevention discussed;Education provided;Falls evaluation completed Falls evaluation completed Falls evaluation completed    FALL RISK PREVENTION PERTAINING TO THE HOME:  Any stairs in or around the home? No  If so, are there any without handrails? No  Home free of loose throw rugs in walkways, pet beds, electrical cords, etc? Yes  Adequate lighting in your home to reduce risk of falls? Yes   ASSISTIVE DEVICES UTILIZED TO PREVENT FALLS:  Life alert? No  Use of a cane, walker or w/c? Yes  Grab bars in the bathroom? Yes  Shower chair or bench in shower?  Requesting script per advice from caseworker  Elevated toilet seat or a handicapped toilet? Yes   TIMED UP AND GO:  Was the test performed? No . Telephonic visit   Cognitive Function:        04/22/2022    4:01 PM  6CIT Screen  What Year? 0 points  What month? 0 points  What time? 0 points  Count back from 20 0 points  Months in reverse 0 points  Repeat phrase 0 points  Total Score 0 points    Immunizations Immunization History  Administered Date(s) Administered   Influenza,inj,Quad PF,6+ Mos 11/05/2019, 11/05/2019, 10/10/2020   PFIZER(Purple Top)SARS-COV-2 Vaccination 04/24/2019, 05/20/2019, 12/26/2019   Pneumococcal Polysaccharide-23 02/14/2020   Tdap 02/14/2020   Zoster, Live 09/10/2020    TDAP status: Up to  date  Flu Vaccine status: Declined, Education has been provided regarding the importance of this vaccine but patient still declined. Advised may receive this vaccine at local pharmacy or Health Dept. Aware to provide a copy of the vaccination record if obtained from local pharmacy or Health Dept. Verbalized acceptance  and understanding.  Pneumococcal vaccine status: Declined,  Education has been provided regarding the importance of this vaccine but patient still declined. Advised may receive this vaccine at local pharmacy or Health Dept. Aware to provide a copy of the vaccination record if obtained from local pharmacy or Health Dept. Verbalized acceptance and understanding.   Covid-19 vaccine status: Information provided on how to obtain vaccines.   Qualifies for Shingles Vaccine? Yes   Zostavax completed Yes   Shingrix Completed?: No.    Education has been provided regarding the importance of this vaccine. Patient has been advised to call insurance company to determine out of pocket expense if they have not yet received this vaccine. Advised may also receive vaccine at local pharmacy or Health Dept. Verbalized acceptance and understanding.  Screening Tests Health Maintenance  Topic Date Due   HIV Screening  Never done   Hepatitis C Screening  Never done   Zoster Vaccines- Shingrix (1 of 2) Never done   COLONOSCOPY (Pts 45-33yrs Insurance coverage will need to be confirmed)  Never done   Pneumonia Vaccine 31+ Years old (2 of 2 - PCV) 02/13/2021   INFLUENZA VACCINE  05/08/2022 (Originally 09/07/2021)   Medicare Annual Wellness (AWV)  04/22/2023   DTaP/Tdap/Td (2 - Td or Tdap) 02/13/2030   HPV VACCINES  Aged Out   COVID-19 Vaccine  Discontinued    Health Maintenance  Health Maintenance Due  Topic Date Due   HIV Screening  Never done   Hepatitis C Screening  Never done   Zoster Vaccines- Shingrix (1 of 2) Never done   COLONOSCOPY (Pts 45-42yrs Insurance coverage will need to be confirmed)  Never done   Pneumonia Vaccine 31+ Years old (2 of 2 - PCV) 02/13/2021    Colon cancer status:  Patient states that he completed Cologuard in the past; no records noted   Lung Cancer Screening: (Low Dose CT Chest recommended if Age 38-80 years, 30 pack-year currently smoking OR have quit w/in 15years.) does not  qualify.   Lung Cancer Screening Referral: n/a  Additional Screening:  Hepatitis C Screening: does qualify;  Vision Screening: Recommended annual ophthalmology exams for early detection of glaucoma and other disorders of the eye. Is the patient up to date with their annual eye exam?  No  Who is the provider or what is the name of the office in which the patient attends annual eye exams? none If pt is not established with a provider, would they like to be referred to a provider to establish care? No .   Dental Screening: Recommended annual dental exams for proper oral hygiene  Community Resource Referral / Chronic Care Management: CRR required this visit?  No   CCM required this visit?  No      Plan:     I have personally reviewed and noted the following in the patient's chart:   Medical and social history Use of alcohol, tobacco or illicit drugs  Current medications and supplements including opioid prescriptions. Patient is currently taking opioid prescriptions. Information provided to patient regarding non-opioid alternatives. Patient advised to discuss non-opioid treatment plan with their provider. Functional ability and status Nutritional status Physical activity Advanced directives List of other  physicians Hospitalizations, surgeries, and ER visits in previous 12 months Vitals Screenings to include cognitive, depression, and falls Referrals and appointments  In addition, I have reviewed and discussed with patient certain preventive protocols, quality metrics, and best practice recommendations. A written personalized care plan for preventive services as well as general preventive health recommendations were provided to patient.     Vanetta Mulders, [place]   [id number]   Due to this being a virtual visit, the after visit summary with patients personalized plan was offered to patient via mail or my-chart.   per request, patient was mailed a copy of AVS.  Nurse  Notes: Patient is requesting a script for a shower chair be sent in; unable to give me any further information other than that it was a recommendation from his Medicaid caseworker.

## 2022-04-22 NOTE — Patient Instructions (Signed)
[name] , Thank you for taking time to come for your Medicare Wellness Visit. I appreciate your ongoing commitment to your health goals. Please review the following plan we discussed and let me know if I can assist you in the future.   These are the goals we discussed:  Goals   None     This is a list of the screening recommended for you and due dates:  Health Maintenance  Topic Date Due   HIV Screening  Never done   Hepatitis C Screening: USPSTF Recommendation to screen - Ages 23-79 yo.  Never done   Zoster (Shingles) Vaccine (1 of 2) Never done   Colon Cancer Screening  Never done   Medicare Annual Wellness Visit  02/09/2017   Pneumonia Vaccine (2 of 2 - PCV) 02/13/2021   Flu Shot  05/08/2022*   DTaP/Tdap/Td vaccine (2 - Td or Tdap) 02/13/2030   HPV Vaccine  Aged Out   COVID-19 Vaccine  Discontinued  *Topic was postponed. The date shown is not the original due date.    Advanced directives: Forms are available if you choose in the future to pursue completion.  This is recommended in order to make sure that your health wishes are honored in the event that you are unable to verbalize them to the provider.    Conditions/risks identified: Aim for 30 minutes of exercise or brisk walking, 6-8 glasses of water, and 5 servings of fruits and vegetables each day.   Next appointment: Follow up in one year for your annual wellness visit.   Preventive Care 66 Years and Older, Male  Preventive care refers to lifestyle choices and visits with your health care provider that can promote health and wellness. What does preventive care include? A yearly physical exam. This is also called an annual well check. Dental exams once or twice a year. Routine eye exams. Ask your health care provider how often you should have your eyes checked. Personal lifestyle choices, including: Daily care of your teeth and gums. Regular physical activity. Eating a healthy diet. Avoiding tobacco and drug  use. Limiting alcohol use. Practicing safe sex. Taking low doses of aspirin every day. Taking vitamin and mineral supplements as recommended by your health care provider. What happens during an annual well check? The services and screenings done by your health care provider during your annual well check will depend on your age, overall health, lifestyle risk factors, and family history of disease. Counseling  Your health care provider may ask you questions about your: Alcohol use. Tobacco use. Drug use. Emotional well-being. Home and relationship well-being. Sexual activity. Eating habits. History of falls. Memory and ability to understand (cognition). Work and work Statistician. Screening  You may have the following tests or measurements: Height, weight, and BMI. Blood pressure. Lipid and cholesterol levels. These may be checked every 5 years, or more frequently if you are over 22 years old. Skin check. Lung cancer screening. You may have this screening every year starting at age 66 if you have a 30-pack-year history of smoking and currently smoke or have quit within the past 15 years. Fecal occult blood test (FOBT) of the stool. You may have this test every year starting at age 66. Flexible sigmoidoscopy or colonoscopy. You may have a sigmoidoscopy every 5 years or a colonoscopy every 10 years starting at age 66. Prostate cancer screening. Recommendations will vary depending on your family history and other risks. Hepatitis C blood test. Hepatitis B blood test. Sexually transmitted  disease (STD) testing. Diabetes screening. This is done by checking your blood sugar (glucose) after you have not eaten for a while (fasting). You may have this done every 1-3 years. Abdominal aortic aneurysm (AAA) screening. You may need this if you are a current or former smoker. Osteoporosis. You may be screened starting at age 66 if you are at high risk. Talk with your health care provider about  your test results, treatment options, and if necessary, the need for more tests. Vaccines  Your health care provider may recommend certain vaccines, such as: Influenza vaccine. This is recommended every year. Tetanus, diphtheria, and acellular pertussis (Tdap, Td) vaccine. You may need a Td booster every 10 years. Zoster vaccine. You may need this after age 66. Pneumococcal 13-valent conjugate (PCV13) vaccine. One dose is recommended after age 66. Pneumococcal polysaccharide (PPSV23) vaccine. One dose is recommended after age 66. Talk to your health care provider about which screenings and vaccines you need and how often you need them. This information is not intended to replace advice given to you by your health care provider. Make sure you discuss any questions you have with your health care provider. Document Released: 02/20/2015 Document Revised: 10/14/2015 Document Reviewed: 11/25/2014 Elsevier Interactive Patient Education  2017 [place] Prevention in the Home Falls can cause injuries. They can happen to people of all ages. There are many things you can do to make your home safe and to help prevent falls. What can I do on the outside of my home? Regularly fix the edges of walkways and driveways and fix any cracks. Remove anything that might make you trip as you walk through a door, such as a raised step or threshold. Trim any bushes or trees on the path to your home. Use bright outdoor lighting. Clear any walking paths of anything that might make someone trip, such as rocks or tools. Regularly check to see if handrails are loose or broken. Make sure that both sides of any steps have handrails. Any raised decks and porches should have guardrails on the edges. Have any leaves, snow, or ice cleared regularly. Use sand or salt on walking paths during winter. Clean up any spills in your garage right away. This includes oil or grease spills. What can I do in the bathroom? Use  night lights. Install grab bars by the toilet and in the tub and shower. Do not use towel bars as grab bars. Use non-skid mats or decals in the tub or shower. If you need to sit down in the shower, use a plastic, non-slip stool. Keep the floor dry. Clean up any water that spills on the floor as soon as it happens. Remove soap buildup in the tub or shower regularly. Attach bath mats securely with double-sided non-slip rug tape. Do not have throw rugs and other things on the floor that can make you trip. What can I do in the bedroom? Use night lights. Make sure that you have a light by your bed that is easy to reach. Do not use any sheets or blankets that are too big for your bed. They should not hang down onto the floor. Have a firm chair that has side arms. You can use this for support while you get dressed. Do not have throw rugs and other things on the floor that can make you trip. What can I do in the kitchen? Clean up any spills right away. Avoid walking on wet floors. Keep items that you use a  lot in easy-to-reach places. If you need to reach something above you, use a strong step stool that has a grab bar. Keep electrical cords out of the way. Do not use floor polish or wax that makes floors slippery. If you must use wax, use non-skid floor wax. Do not have throw rugs and other things on the floor that can make you trip. What can I do with my stairs? Do not leave any items on the stairs. Make sure that there are handrails on both sides of the stairs and use them. Fix handrails that are broken or loose. Make sure that handrails are as long as the stairways. Check any carpeting to make sure that it is firmly attached to the stairs. Fix any carpet that is loose or worn. Avoid having throw rugs at the top or bottom of the stairs. If you do have throw rugs, attach them to the floor with carpet tape. Make sure that you have a light switch at the top of the stairs and the bottom of the  stairs. If you do not have them, ask someone to add them for you. What else can I do to help prevent falls? Wear shoes that: Do not have high heels. Have rubber bottoms. Are comfortable and fit you well. Are closed at the toe. Do not wear sandals. If you use a stepladder: Make sure that it is fully opened. Do not climb a closed stepladder. Make sure that both sides of the stepladder are locked into place. Ask someone to hold it for you, if possible. Clearly mark and make sure that you can see: Any grab bars or handrails. First and last steps. Where the edge of each step is. Use tools that help you move around (mobility aids) if they are needed. These include: Canes. Walkers. Scooters. Crutches. Turn on the lights when you go into a dark area. Replace any light bulbs as soon as they burn out. Set up your furniture so you have a clear path. Avoid moving your furniture around. If any of your floors are uneven, fix them. If there are any pets around you, be aware of where they are. Review your medicines with your doctor. Some medicines can make you feel dizzy. This can increase your chance of falling. Ask your doctor what other things that you can do to help prevent falls. This information is not intended to replace advice given to you by your health care provider. Make sure you discuss any questions you have with your health care provider. Document Released: 11/20/2008 Document Revised: 07/02/2015 Document Reviewed: 02/28/2014 Elsevier Interactive Patient Education  2017 [employer].

## 2022-04-27 DIAGNOSIS — G894 Chronic pain syndrome: Secondary | ICD-10-CM | POA: Diagnosis not present

## 2022-04-27 DIAGNOSIS — M5459 Other low back pain: Secondary | ICD-10-CM | POA: Diagnosis not present

## 2022-04-27 DIAGNOSIS — M5136 Other intervertebral disc degeneration, lumbar region: Secondary | ICD-10-CM | POA: Diagnosis not present

## 2022-04-27 DIAGNOSIS — Z79891 Long term (current) use of opiate analgesic: Secondary | ICD-10-CM | POA: Diagnosis not present

## 2022-04-27 DIAGNOSIS — Z79899 Other long term (current) drug therapy: Secondary | ICD-10-CM | POA: Diagnosis not present

## 2022-04-27 DIAGNOSIS — M62830 Muscle spasm of back: Secondary | ICD-10-CM | POA: Diagnosis not present

## 2022-05-02 DIAGNOSIS — M1990 Unspecified osteoarthritis, unspecified site: Secondary | ICD-10-CM | POA: Diagnosis not present

## 2022-05-02 DIAGNOSIS — S0990XA Unspecified injury of head, initial encounter: Secondary | ICD-10-CM | POA: Diagnosis not present

## 2022-05-10 ENCOUNTER — Other Ambulatory Visit: Payer: Self-pay | Admitting: Family Medicine

## 2022-05-16 DIAGNOSIS — G4733 Obstructive sleep apnea (adult) (pediatric): Secondary | ICD-10-CM | POA: Diagnosis not present

## 2022-05-26 ENCOUNTER — Telehealth: Payer: Self-pay

## 2022-05-26 NOTE — Telephone Encounter (Signed)
Prescription Request  05/26/2022  LOV: Visit date not found  What is the name of the medication or equipment?  Accu-Chek Softclix Lancets lancets  Have you contacted your pharmacy to request a refill? Yes   Which pharmacy would you like this sent to?  [facility] - [place], [place] - [address] Jerolyn Shin [place] [address] Phone: [phone] Fax: [phone]    Patient notified that their request is being sent to the clinical staff for review and that they should receive a response within 2 business days.   Please advise at Mobile [phone] (mobile)

## 2022-05-27 MED ORDER — ACCU-CHEK SOFTCLIX LANCETS MISC: 3 refills | Status: DC

## 2022-06-02 ENCOUNTER — Telehealth: Payer: Self-pay

## 2022-06-02 ENCOUNTER — Other Ambulatory Visit: Payer: Self-pay

## 2022-06-02 ENCOUNTER — Other Ambulatory Visit: Payer: Self-pay | Admitting: Family Medicine

## 2022-06-02 DIAGNOSIS — J4489 Other specified chronic obstructive pulmonary disease: Secondary | ICD-10-CM

## 2022-06-02 MED ORDER — ALBUTEROL SULFATE HFA 108 (90 BASE) MCG/ACT IN AERS: 2.0000 | INHALATION_SPRAY | Freq: Four times a day (QID) | RESPIRATORY_TRACT | 1 refills | Status: DC | PRN

## 2022-06-02 MED ORDER — ANORO ELLIPTA 62.5-25 MCG/ACT IN AEPB
INHALATION_SPRAY | RESPIRATORY_TRACT | 3 refills | Status: DC
Start: 2022-06-02 — End: 2022-10-11

## 2022-06-02 MED ORDER — LINACLOTIDE 290 MCG PO CAPS: 290.0000 ug | ORAL_CAPSULE | Freq: Every morning | ORAL | 1 refills | Status: DC

## 2022-06-02 NOTE — Telephone Encounter (Signed)
Spoke with patient and advised patient per Dr Adrian Bird that it was not necessary to check his blood sugar. Patient states that insurance has been paying for him to monitor his blood sugar with no issues and he would like his supplies if possible. I informed patient I would send a note to Dr Adrian Bird.

## 2022-06-07 DIAGNOSIS — M5459 Other low back pain: Secondary | ICD-10-CM | POA: Diagnosis not present

## 2022-06-07 DIAGNOSIS — Z79899 Other long term (current) drug therapy: Secondary | ICD-10-CM | POA: Diagnosis not present

## 2022-06-07 DIAGNOSIS — G894 Chronic pain syndrome: Secondary | ICD-10-CM | POA: Diagnosis not present

## 2022-06-07 DIAGNOSIS — M62830 Muscle spasm of back: Secondary | ICD-10-CM | POA: Diagnosis not present

## 2022-06-07 DIAGNOSIS — M5136 Other intervertebral disc degeneration, lumbar region: Secondary | ICD-10-CM | POA: Diagnosis not present

## 2022-06-07 NOTE — Telephone Encounter (Signed)
Error

## 2022-06-22 ENCOUNTER — Other Ambulatory Visit: Payer: Self-pay | Admitting: Family Medicine

## 2022-06-22 DIAGNOSIS — M545 Low back pain, unspecified: Secondary | ICD-10-CM

## 2022-07-01 ENCOUNTER — Other Ambulatory Visit: Payer: Self-pay | Admitting: Family Medicine

## 2022-07-15 ENCOUNTER — Telehealth: Payer: Self-pay

## 2022-07-15 NOTE — Telephone Encounter (Signed)
Prescription Request  07/15/2022  LOV: Visit date not found  What is the name of the medication or equipment? Cpap machine Pt was seen recently for this about 4 months ago [facility] received paperwork but no order and pt needs new Cpap machine   Have you contacted your pharmacy to request a refill? Yes   Which pharmacy would you like this sent to? [facility] in [place]    Patient notified that their request is being sent to the clinical staff for review and that they should receive a response within 2 business days.   Please advise at Mobile [phone] (mobile)

## 2022-07-19 ENCOUNTER — Other Ambulatory Visit: Payer: Self-pay

## 2022-07-19 DIAGNOSIS — G4733 Obstructive sleep apnea (adult) (pediatric): Secondary | ICD-10-CM

## 2022-07-19 NOTE — Telephone Encounter (Signed)
Spoke with patient, he would like to know if there is anyway another physician can sign orders for his new Cpap machine.thanks

## 2022-07-19 NOTE — Telephone Encounter (Signed)
Nurses Please delineate what is needed Feel free to write out order for new CPAP machine and supplies Diagnosis sleep apnea I will sign this Then send it to [facility]  (Please be aware if this ends up being more complicated than sounds-such as if he needs a new sleep study etc. we will have to table this until Dr. Adriana Simas gets back)

## 2022-07-22 ENCOUNTER — Other Ambulatory Visit: Payer: Self-pay | Admitting: Family Medicine

## 2022-07-25 ENCOUNTER — Other Ambulatory Visit: Payer: Self-pay

## 2022-07-25 DIAGNOSIS — M5136 Other intervertebral disc degeneration, lumbar region: Secondary | ICD-10-CM | POA: Diagnosis not present

## 2022-07-25 DIAGNOSIS — Z79899 Other long term (current) drug therapy: Secondary | ICD-10-CM | POA: Diagnosis not present

## 2022-07-25 DIAGNOSIS — M62830 Muscle spasm of back: Secondary | ICD-10-CM | POA: Diagnosis not present

## 2022-07-25 DIAGNOSIS — G894 Chronic pain syndrome: Secondary | ICD-10-CM | POA: Diagnosis not present

## 2022-07-25 DIAGNOSIS — M5459 Other low back pain: Secondary | ICD-10-CM | POA: Diagnosis not present

## 2022-07-25 NOTE — Telephone Encounter (Signed)
These orders have been faxed pre requests

## 2022-08-04 DIAGNOSIS — G4733 Obstructive sleep apnea (adult) (pediatric): Secondary | ICD-10-CM | POA: Diagnosis not present

## 2022-08-09 ENCOUNTER — Other Ambulatory Visit: Payer: Self-pay | Admitting: Family Medicine

## 2022-08-24 ENCOUNTER — Telehealth: Payer: Self-pay

## 2022-08-24 NOTE — Telephone Encounter (Signed)
[facility] calling they have received chart notes but no order to replacement C pap machine they need a order sent   Fax [phone] Phone [phone] [facility]

## 2022-08-24 NOTE — Telephone Encounter (Signed)
Everlene Other G, DO     I don't have anything regarding this.

## 2022-08-25 ENCOUNTER — Telehealth: Payer: Self-pay

## 2022-08-25 ENCOUNTER — Other Ambulatory Visit: Payer: Self-pay

## 2022-08-25 ENCOUNTER — Other Ambulatory Visit: Payer: Self-pay | Admitting: Family Medicine

## 2022-08-25 DIAGNOSIS — G4733 Obstructive sleep apnea (adult) (pediatric): Secondary | ICD-10-CM

## 2022-08-25 DIAGNOSIS — G8929 Other chronic pain: Secondary | ICD-10-CM

## 2022-08-25 NOTE — Telephone Encounter (Signed)
CPAP ordered by Dr Adriana Simas and faxed to [facility].

## 2022-09-06 ENCOUNTER — Other Ambulatory Visit: Payer: Self-pay | Admitting: Family Medicine

## 2022-09-19 ENCOUNTER — Other Ambulatory Visit: Payer: Self-pay | Admitting: Family Medicine

## 2022-09-19 DIAGNOSIS — Z79899 Other long term (current) drug therapy: Secondary | ICD-10-CM | POA: Diagnosis not present

## 2022-09-19 DIAGNOSIS — M5459 Other low back pain: Secondary | ICD-10-CM | POA: Diagnosis not present

## 2022-09-19 DIAGNOSIS — G894 Chronic pain syndrome: Secondary | ICD-10-CM | POA: Diagnosis not present

## 2022-09-19 DIAGNOSIS — M62830 Muscle spasm of back: Secondary | ICD-10-CM | POA: Diagnosis not present

## 2022-09-19 DIAGNOSIS — Z79891 Long term (current) use of opiate analgesic: Secondary | ICD-10-CM | POA: Diagnosis not present

## 2022-09-19 DIAGNOSIS — M5136 Other intervertebral disc degeneration, lumbar region: Secondary | ICD-10-CM | POA: Diagnosis not present

## 2022-09-26 ENCOUNTER — Ambulatory Visit (INDEPENDENT_AMBULATORY_CARE_PROVIDER_SITE_OTHER): Payer: 59 | Admitting: Family Medicine

## 2022-09-26 VITALS — BP 147/83 | HR 60 | Ht 68.0 in | Wt 195.8 lb

## 2022-09-26 DIAGNOSIS — G4700 Insomnia, unspecified: Secondary | ICD-10-CM | POA: Diagnosis not present

## 2022-09-26 DIAGNOSIS — Z8669 Personal history of other diseases of the nervous system and sense organs: Secondary | ICD-10-CM

## 2022-09-26 DIAGNOSIS — G4733 Obstructive sleep apnea (adult) (pediatric): Secondary | ICD-10-CM | POA: Diagnosis not present

## 2022-09-26 DIAGNOSIS — Z8639 Personal history of other endocrine, nutritional and metabolic disease: Secondary | ICD-10-CM

## 2022-09-26 DIAGNOSIS — R5383 Other fatigue: Secondary | ICD-10-CM | POA: Diagnosis not present

## 2022-09-26 DIAGNOSIS — I10 Essential (primary) hypertension: Secondary | ICD-10-CM

## 2022-09-26 DIAGNOSIS — E782 Mixed hyperlipidemia: Secondary | ICD-10-CM | POA: Diagnosis not present

## 2022-09-26 MED ORDER — TRAZODONE HCL 50 MG PO TABS: 50.0000 mg | ORAL_TABLET | Freq: Every day | ORAL | 0 refills | Status: DC

## 2022-09-26 NOTE — Assessment & Plan Note (Addendum)
No recent seizure.  Continue Keppra and carbamazepine.

## 2022-09-26 NOTE — Assessment & Plan Note (Signed)
Trial of Trazodone. °

## 2022-09-26 NOTE — Progress Notes (Signed)
Subjective:  Patient ID: Adrian Bird, male    DOB: 03/02/1955  Age: 67 y.o. MRN: [id number]  CC:  Follow up   HPI:  67 year old with an extensive past medical history presents for follow-up.  Patient's blood pressure mildly elevated here today.  Patient states that he needs a new sleep apnea machine.  States that his is giving out.  He endorses compliance.  Patient reports ongoing issues with insomnia.  He states that he has difficulty staying asleep.  He is using melatonin without significant improvement.  Additionally, patient reports ongoing fatigue.  Needs labs.  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/2020   Essential tremor 10/15/2020   Anxiety disorder 09/16/2019   Chronic low back pain 09/16/2019   Long-term current use of opiate analgesic 09/16/2019   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/16/2019   Essential hypertension 08/20/2014   Arthritis 10/04/2012   Mixed hyperlipidemia 10/04/2012    Social Hx   Social History   Socioeconomic History   Marital status: Married    Spouse name: Not on file   Number of children: Not on file   Years of education: Not on file   Highest education level: Not on file  Occupational History   Not on file  Tobacco Use   Smoking status: Every Day    Current packs/day: 0.25    Average packs/day: 0.3 packs/day for 50.0 years (12.5 ttl pk-yrs)    Types: E-cigarettes, Cigarettes   Smokeless tobacco: Never  Vaping Use   Vaping status: Some Days  Substance and Sexual Activity   Alcohol use: No   Drug use: No   Sexual activity: Yes  Other Topics Concern   Not on file  Social History Narrative   Not on file   Social Determinants of Health    Financial Resource Strain: Low Risk  (04/22/2022)   Overall Financial Resource Strain (CARDIA)    Difficulty of Paying Living Expenses: Not hard at all  Food Insecurity: No Food Insecurity (04/22/2022)   Hunger Vital Sign    Worried About Running Out of Food in the Last Year: Never true    Ran Out of Food in the Last Year: Never true  Transportation Needs: No Transportation Needs (04/22/2022)   PRAPARE - Administrator, Civil Service (Medical): No    Lack of Transportation (Non-Medical): No  Physical Activity: Inactive (04/22/2022)   Exercise Vital Sign    Days of Exercise per Week: 0 days    Minutes of Exercise per Session: 0 min  Stress: No Stress Concern Present (04/22/2022)   [employer] of Occupational Health - Occupational Stress Questionnaire    Feeling of Stress : Only a little  Social Connections: Unknown (04/22/2022)   Social Connection and Isolation Panel [NHANES]    Frequency of Communication with Friends and Family: Three times a week    Frequency of Social Gatherings with Friends and Family: Once a week    Attends Religious Services: Never    Database administrator or Organizations: No    Attends Banker Meetings: Never    Marital Status: Patient declined    Review of Systems Per HPI  Objective:  BP (!) 147/83  Pulse 60   Ht 5\' 8"  (1.727 m)   Wt 195 lb 12.8 oz (88.8 kg)   SpO2 96%   BMI 29.77 kg/m      09/26/2022   10:56 AM 04/22/2022    3:57 PM 03/28/2022   10:47 AM  BP/Weight  Systolic BP 147 -- 106  Diastolic BP 83 -- 66  Wt. (Lbs) 195.8 190 190  BMI 29.77 kg/m2 28.89 kg/m2 28.89 kg/m2    Physical Exam Vitals and nursing note reviewed.  Constitutional:      General: He is not in acute distress.    Appearance: Normal appearance.  HENT:     Head: Normocephalic and atraumatic.  Eyes:     General:        Right eye: No discharge.        Left eye: No discharge.     Conjunctiva/sclera: Conjunctivae normal.   Cardiovascular:     Rate and Rhythm: Normal rate and regular rhythm.  Pulmonary:     Effort: Pulmonary effort is normal.     Breath sounds: Normal breath sounds.  Abdominal:     General: There is no distension.     Palpations: Abdomen is soft.     Tenderness: There is no abdominal tenderness.  Neurological:     Mental Status: He is alert.     Lab Results  Component Value Date   WBC 7.3 01/20/2022   HGB 14.9 01/20/2022   HCT 44.4 01/20/2022   PLT 245 01/20/2022   GLUCOSE 89 01/20/2022   CHOL 133 07/26/2021   TRIG 82 07/26/2021   HDL 57 07/26/2021   LDLCALC 60 07/26/2021   ALT 25 01/20/2022   AST 22 01/20/2022   NA 131 (L) 01/20/2022   K 4.1 01/20/2022   CL 97 (L) 01/20/2022   CREATININE 0.57 (L) 01/20/2022   BUN 8 01/20/2022   CO2 27 01/20/2022   HGBA1C 5.9 (H) 07/26/2021     Assessment & Plan:   Problem List Items Addressed This Visit       Cardiovascular and Mediastinum   Essential hypertension    BP mildly elevated here today.  Will continue to monitor.  Continue diltiazem and lisinopril.      Relevant Orders   CMP14+EGFR     Respiratory   OSA on CPAP    Compliant.  Will send in prescription for new device.        Other   Mixed hyperlipidemia   Relevant Orders   Lipid panel   Insomnia - Primary    Trial of Trazodone.      History of epilepsy    No recent seizure.  Continue Keppra and carbamazepine.      Fatigue    Labs for evaluation today.      Other Visit Diagnoses     History of iron deficiency       Relevant Orders   CBC   Iron, TIBC and Ferritin Panel       Meds ordered this encounter  Medications   traZODone (DESYREL) 50 MG tablet    Sig: Take 1 tablet (50 mg total) by mouth at bedtime.    Dispense:  90 tablet    Refill:  0    Follow-up:  6 months  Reef Achterberg Adriana Simas DO [facility]

## 2022-09-26 NOTE — Assessment & Plan Note (Signed)
Compliant.  Will send in prescription for new device.

## 2022-09-26 NOTE — Assessment & Plan Note (Signed)
Labs for evaluation today.

## 2022-09-26 NOTE — Patient Instructions (Signed)
Labs today. ? ?Medication as prescribed. ? ?Follow up in 6 months. ?

## 2022-09-26 NOTE — Assessment & Plan Note (Signed)
BP mildly elevated here today.  Will continue to monitor.  Continue diltiazem and lisinopril.

## 2022-09-27 LAB — CBC
Hematocrit: 42.5 % (ref 37.5–51.0)
Hemoglobin: 14.3 g/dL (ref 13.0–17.7)
MCH: 30.4 pg (ref 26.6–33.0)
MCHC: 33.6 g/dL (ref 31.5–35.7)
MCV: 90 fL (ref 79–97)
Platelets: 276 10*3/uL (ref 150–450)
RBC: 4.71 x10E6/uL (ref 4.14–5.80)
RDW: 12.5 % (ref 11.6–15.4)
WBC: 5.7 10*3/uL (ref 3.4–10.8)

## 2022-09-27 LAB — LIPID PANEL
Chol/HDL Ratio: 2.1 ratio (ref 0.0–5.0)
Cholesterol, Total: 126 mg/dL (ref 100–199)
HDL: 59 mg/dL (ref >39)
LDL Chol Calc (NIH): 52 mg/dL (ref 0–99)
Triglycerides: 75 mg/dL (ref 0–149)
VLDL Cholesterol Cal: 15 mg/dL (ref 5–40)

## 2022-09-27 LAB — CMP14+EGFR
ALT: 27 IU/L (ref 0–44)
AST: 21 IU/L (ref 0–40)
Albumin: 4.5 g/dL (ref 3.9–4.9)
Alkaline Phosphatase: 112 IU/L (ref 44–121)
BUN/Creatinine Ratio: 8 — ABNORMAL LOW (ref 10–24)
BUN: 5 mg/dL — ABNORMAL LOW (ref 8–27)
Bilirubin Total: 0.3 mg/dL (ref 0.0–1.2)
CO2: 24 mmol/L (ref 20–29)
Calcium: 9.2 mg/dL (ref 8.6–10.2)
Chloride: 90 mmol/L — ABNORMAL LOW (ref 96–106)
Creatinine, Ser: 0.62 mg/dL — ABNORMAL LOW (ref 0.76–1.27)
Globulin, Total: 1.9 g/dL (ref 1.5–4.5)
Glucose: 88 mg/dL (ref 70–99)
Potassium: 4.7 mmol/L (ref 3.5–5.2)
Sodium: 130 mmol/L — ABNORMAL LOW (ref 134–144)
Total Protein: 6.4 g/dL (ref 6.0–8.5)
eGFR: 105 mL/min/{1.73_m2} (ref >59)

## 2022-09-27 LAB — IRON,TIBC AND FERRITIN PANEL
Ferritin: 50 ng/mL (ref 30–400)
Iron Saturation: 45 % (ref 15–55)
Iron: 159 ug/dL (ref 38–169)
Total Iron Binding Capacity: 354 ug/dL (ref 250–450)
UIBC: 195 ug/dL (ref 111–343)

## 2022-09-28 ENCOUNTER — Telehealth: Payer: Self-pay

## 2022-09-28 NOTE — Telephone Encounter (Signed)
Tommie Raymond called lVM about Adrian Bird they had talked to [facility] Patient and was checking on the C-pap and was told they are waiting back from the doctor as to what pressure needs to be set on the C-pap  Jamesdavid Stifel- [phone]

## 2022-09-28 NOTE — Telephone Encounter (Signed)
Dr Adriana Simas to see

## 2022-09-29 NOTE — Telephone Encounter (Signed)
Patient states the pressure is 14

## 2022-09-29 NOTE — Telephone Encounter (Signed)
Everlene Other G, DO     Please call and ask patient current pressure setting.

## 2022-10-04 ENCOUNTER — Ambulatory Visit
Admission: EM | Admit: 2022-10-04 | Discharge: 2022-10-04 | Disposition: A | Discharge status: Home / Self Care | Payer: 59 | Attending: Nurse Practitioner | Admitting: Nurse Practitioner

## 2022-10-04 DIAGNOSIS — R21 Rash and other nonspecific skin eruption: Secondary | ICD-10-CM | POA: Diagnosis not present

## 2022-10-04 MED ORDER — TRIAMCINOLONE ACETONIDE 0.1 % EX CREA: 1.0000 | TOPICAL_CREAM | Freq: Two times a day (BID) | CUTANEOUS | 0 refills | Status: DC

## 2022-10-04 NOTE — Discharge Instructions (Signed)
Take medication as prescribed. May also take over-the-counter Zyrtec during the daytime and Benadryl at bedtime to help with itching. Avoid hot baths or showers while symptoms persist.  Recommend taking lukewarm baths. May apply cool cloths to the area to help with itching or discomfort. Avoid scratching, rubbing, or manipulating the areas while symptoms persist. Recommend deodorant that does not contain dye, fragrance or alcohols. Recommend Aveeno colloidal oatmeal bath to use to help with drying and itching. Follow up if symptoms do not improve.

## 2022-10-04 NOTE — ED Provider Notes (Signed)
[facility]    CSN: [id number] Arrival date & time: 10/04/22  1512      History   Chief Complaint No chief complaint on file.   HPI Adrian Bird is a 67 y.o. male.   The history is provided by the patient and a relative (Daughter).   Patient presents with his daughter after he developed a rash under his arms that started last evening.  Patient states he used a wife that was on the back of the toilet at home, and shortly thereafter, symptoms started.  Patient denies fever, chills, oozing, or drainage from his arms.  He states that the rash is "itching and burning".  He has not taken any medication for his symptoms.  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   Essential tremor 10/15/2020   Anxiety disorder 09/16/2019   Chronic low back pain 09/16/2019   Long-term current use of opiate analgesic 09/16/2019   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/16/2019   Essential hypertension 08/20/2014   Arthritis 10/04/2012   Mixed hyperlipidemia 10/04/2012    Past Surgical History:  Procedure Laterality Date   BIOPSY  01/27/2022   Procedure: BIOPSY;  Surgeon: Marguerita Merles, Reuel Boom, MD;  Location: [facility];  Service: Gastroenterology;;   CYST REMOVAL NECK     CYST REMOVAL TRUNK     ESOPHAGOGASTRODUODENOSCOPY (EGD) WITH PROPOFOL N/A 01/27/2022   Procedure: ESOPHAGOGASTRODUODENOSCOPY (EGD) WITH PROPOFOL;  Surgeon: Dolores Frame, MD;  Location: [facility];  Service: Gastroenterology;  Laterality: N/A;  1:30 pm, pt knows to arrive  at 8:00   EYE SURGERY Bilateral    HERNIA REPAIR     KNEE SURGERY Left    TONSILLECTOMY         Home Medications    Prior to Admission medications   Medication Sig Start Date End Date Taking? Authorizing Provider  triamcinolone cream (KENALOG) 0.1 % Apply 1 Application topically 2 (two) times daily. 10/04/22  Yes Beyza Bellino-Warren, Sadie Haber, NP  atorvastatin (LIPITOR) 80 MG tablet TAKE 1 TABLET BY MOUTH AT BEDTIME. 06/24/22   Tommie Sams, DO  busPIRone (BUSPAR) 5 MG tablet take 1 tablet by mouth twice daily, as needed. 09/20/22   Tommie Sams, DO  carbamazepine (TEGRETOL) 200 MG tablet take 1 tablet by mouth 3 times daily. 08/25/22   Tommie Sams, DO  diltiazem (CARDIZEM CD) 120 MG 24 hr capsule TAKE 1 CAPSULE BY MOUTH ONCE DAILY. 06/24/22   Tommie Sams, DO  ezetimibe (ZETIA) 10 MG tablet TAKE 1 TABLET BY MOUTH AT BEDTIME. 06/24/22   Tommie Sams, DO  famotidine (PEPCID) 20 MG tablet take (1) tablet twice daily. 09/20/22   Tommie Sams, DO  FLUoxetine (PROZAC) 10 MG capsule TAKE 1 CAPSULE BY MOUTH ONCE DAILY. 06/24/22   Tommie Sams, DO  gabapentin (NEURONTIN) 300 MG capsule take 1 capsule by mouth twice daily. 08/25/22   Tommie Sams, DO  levETIRAcetam (KEPPRA) 750 MG tablet take (1) tablet twice daily.  09/20/22   Everlene Other G, DO  lisinopril (ZESTRIL) 20 MG tablet TAKE 1 TABLET BY MOUTH EVERY MORNING. 06/24/22   Cook, Jayce G, DO  montelukast (SINGULAIR) 10 MG tablet TAKE 1 TABLET BY MOUTH AT BEDTIME. 06/24/22   Tommie Sams, DO  Multiple Vitamin (MULTIVITAMIN WITH MINERALS) TABS tablet Take 1 tablet by mouth daily.    [provider]  omeprazole (PRILOSEC) 40 MG capsule TAKE 1 CAPSULE BY MOUTH TWICE DAILY. 06/07/22   Tommie Sams, DO  Oxycodone HCl 10 MG TABS Take 10 mg by mouth in the morning, at noon, in the evening, and at bedtime.    [provider]  polyethylene glycol (MIRALAX / GLYCOLAX) 17 g packet Take 17 g by mouth daily as needed for moderate constipation.     [provider]  tamsulosin (FLOMAX) 0.4 MG CAPS capsule TAKE 1 CAPSULE BY MOUTH ONCE DAILY. 06/24/22   Tommie Sams, DO  traZODone (DESYREL) 50 MG tablet Take 1 tablet (50 mg total) by mouth at bedtime. 09/26/22   Tommie Sams, DO  umeclidinium-vilanterol (ANORO ELLIPTA) 62.5-25 MCG/ACT AEPB USE (1) PUFF DAILY AS DIRECTED. 06/02/22   Tommie Sams, DO    Family History Family History  Problem Relation Age of Onset   Congestive Heart Failure Mother    Lung cancer Father    Diabetes Sister    Diabetes Brother    Diabetes Maternal Aunt    Lung cancer Maternal Grandfather     Social History Social History   Tobacco Use   Smoking status: Every Day    Current packs/day: 0.25    Average packs/day: 0.3 packs/day for 50.0 years (12.5 ttl pk-yrs)    Types: E-cigarettes, Cigarettes   Smokeless tobacco: Never  Vaping Use   Vaping status: Some Days  Substance Use Topics   Alcohol use: No   Drug use: No     Allergies   Cephalexin, Doxycycline, Penicillins, and Sulfa antibiotics   Review of Systems Review of Systems Per HPI  Physical Exam Triage Vital Signs ED Triage Vitals  Encounter Vitals Group     BP 10/04/22 1519 (!) 173/89     Systolic BP Percentile --      Diastolic BP Percentile --      Pulse Rate 10/04/22 1519 76     Resp 10/04/22 1519 12     Temp 10/04/22 1519 98 F (36.7 C)     Temp Source 10/04/22 1519 Oral     SpO2 10/04/22 1519 92 %     Weight --      Height --      Head Circumference --      Peak Flow --      Pain Score 10/04/22 1521 7     Pain Loc --      Pain Education --      Exclude from Growth Chart --    No data found.  Updated Vital Signs BP (!) 173/89 (BP Location: Right Arm)   Pulse 76   Temp 98 F (36.7 C) (Oral)   Resp 12   SpO2 92%   Visual Acuity Right Eye Distance:   Left Eye Distance:   Bilateral Distance:    Right Eye Near:   Left Eye Near:    Bilateral Near:     Physical Exam Vitals and nursing note  reviewed.  Constitutional:      General: He is not in acute distress.    Appearance: Normal appearance.  HENT:     Head: Normocephalic.  Eyes:     Extraocular Movements: Extraocular movements intact.     Pupils: Pupils are equal, round, and reactive to light.  Musculoskeletal:     Cervical back: Normal range of motion.  Skin:    General: Skin is warm and dry.     Findings: Rash present.     Comments: Erythema noted under the bilateral axilla.  Areas are flat, with distinct borders, there is no oozing, fluctuance, or drainage present.  Neurological:     General: No focal deficit present.     Mental Status: He is alert and oriented to person, place, and time.  Psychiatric:        Mood and Affect: Mood normal.        Behavior: Behavior normal.      UC Treatments / Results  Labs (all labs ordered are listed, but only abnormal results are displayed) Labs Reviewed - No data to display  EKG   Radiology No results found.  Procedures Procedures (including critical care time)  Medications Ordered in UC Medications - No data to display  Initial Impression / Assessment and Plan / UC Course  I have reviewed the triage vital signs and the nursing notes.  Pertinent labs & imaging results that were available during my care of the patient were reviewed by me and considered in my medical decision making (see chart for details).  The patient is well-appearing, he is in no acute distress, vital signs are stable.  Symptoms appear to be consistent with a localized reaction to the wipes the patient was using.  Will start patient on triamcinolone cream 0.1% to apply to the affected areas that with inflammation and itching.  Supportive care recommendations were provided and discussed with the patient and his daughter to include use of over-the-counter counter antihistamines for itching, cool compresses to help with itching and burning, and Aveeno colloidal oatmeal bath to help with itching  and drying of the rash.  Patient was advised to follow-up in this clinic or with his primary care physician if symptoms fail to improve.  Patient and daughter were in agreement with this plan of care and verbalized understanding.  All questions were answered.  Patient stable for discharge.  Final Clinical Impressions(s) / UC Diagnoses   Final diagnoses:  Rash and nonspecific skin eruption     Discharge Instructions      Take medication as prescribed. May also take over-the-counter Zyrtec during the daytime and Benadryl at bedtime to help with itching. Avoid hot baths or showers while symptoms persist.  Recommend taking lukewarm baths. May apply cool cloths to the area to help with itching or discomfort. Avoid scratching, rubbing, or manipulating the areas while symptoms persist. Recommend deodorant that does not contain dye, fragrance or alcohols. Recommend Aveeno colloidal oatmeal bath to use to help with drying and itching. Follow up if symptoms do not improve.      ED Prescriptions     Medication Sig Dispense Auth. Provider   triamcinolone cream (KENALOG) 0.1 % Apply 1 Application topically 2 (two) times daily. 80 g Lillar Bianca-Warren, Sadie Haber, NP      PDMP not reviewed this encounter.   Abran Cantor, NP 10/04/22 1753

## 2022-10-04 NOTE — ED Triage Notes (Signed)
Pt c/o rash under both arms, that came up last night, spont on lower back came up last week.

## 2022-10-06 ENCOUNTER — Other Ambulatory Visit: Payer: Self-pay | Admitting: Family Medicine

## 2022-10-06 DIAGNOSIS — E871 Hypo-osmolality and hyponatremia: Secondary | ICD-10-CM

## 2022-10-08 ENCOUNTER — Other Ambulatory Visit: Payer: Self-pay | Admitting: Family Medicine

## 2022-10-08 DIAGNOSIS — J4489 Other specified chronic obstructive pulmonary disease: Secondary | ICD-10-CM

## 2022-10-11 ENCOUNTER — Other Ambulatory Visit: Payer: Self-pay

## 2022-10-11 DIAGNOSIS — J4489 Other specified chronic obstructive pulmonary disease: Secondary | ICD-10-CM

## 2022-10-11 DIAGNOSIS — J439 Emphysema, unspecified: Secondary | ICD-10-CM

## 2022-10-11 MED ORDER — ANORO ELLIPTA 62.5-25 MCG/ACT IN AEPB
INHALATION_SPRAY | RESPIRATORY_TRACT | 3 refills | Status: DC
Start: 2022-10-11 — End: 2023-02-13

## 2022-10-12 ENCOUNTER — Telehealth: Payer: Self-pay

## 2022-10-12 NOTE — Telephone Encounter (Signed)
Papers given to front to refax to [facility] location

## 2022-10-13 ENCOUNTER — Other Ambulatory Visit: Payer: Self-pay | Admitting: Family Medicine

## 2022-10-17 ENCOUNTER — Other Ambulatory Visit: Payer: Self-pay | Admitting: Family Medicine

## 2022-10-17 ENCOUNTER — Telehealth: Payer: Self-pay

## 2022-10-17 NOTE — Telephone Encounter (Signed)
Prescription Request  10/17/2022  LOV: Visit date not found  What is the name of the medication or equipment? traZODone (DESYREL) 50 MG table   Have you contacted your pharmacy to request a refill? Yes   Which pharmacy would you like this sent to? [facility]   Patient notified that their request is being sent to the clinical staff for review and that they should receive a response within 2 business days.   Please advise at Mobile [phone] (mobile)

## 2022-10-18 DIAGNOSIS — G4733 Obstructive sleep apnea (adult) (pediatric): Secondary | ICD-10-CM | POA: Diagnosis not present

## 2022-10-18 NOTE — Telephone Encounter (Signed)
Tommie Sams, DO     Was sent on 8/19.

## 2022-10-21 ENCOUNTER — Other Ambulatory Visit: Payer: Self-pay

## 2022-10-21 ENCOUNTER — Ambulatory Visit
Admission: EM | Admit: 2022-10-21 | Discharge: 2022-10-21 | Disposition: A | Discharge status: Home / Self Care | Payer: 59 | Attending: Nurse Practitioner | Admitting: Nurse Practitioner

## 2022-10-21 DIAGNOSIS — Z1152 Encounter for screening for COVID-19: Secondary | ICD-10-CM | POA: Diagnosis not present

## 2022-10-21 DIAGNOSIS — J069 Acute upper respiratory infection, unspecified: Secondary | ICD-10-CM

## 2022-10-21 LAB — POCT RAPID STREP A (OFFICE): Rapid Strep A Screen: NEGATIVE

## 2022-10-21 MED ORDER — ALBUTEROL SULFATE HFA 108 (90 BASE) MCG/ACT IN AERS: 2.0000 | INHALATION_SPRAY | Freq: Four times a day (QID) | RESPIRATORY_TRACT | 0 refills | Status: DC | PRN

## 2022-10-21 MED ORDER — FLUTICASONE PROPIONATE 50 MCG/ACT NA SUSP: 2.0000 | Freq: Every day | NASAL | 0 refills | Status: DC

## 2022-10-21 NOTE — Discharge Instructions (Addendum)
The rapid strep test is negative.  A throat culture and COVID test are pending.  As discussed, you will be contacted if the COVID test is positive.  You will also need to hold your atorvastatin (Lipitor) for 2 weeks if you choose to have Paxlovid prescribed. Take medication as prescribed. Increase fluids and allow for plenty of rest. May take over-the-counter Tylenol as needed for pain, fever, general discomfort. Warm salt water gargles 3-4 times daily or as needed for throat pain or discomfort. Recommend a soft diet while throat pain persist. May use normal saline nasal spray throughout the day to help with nasal congestion. If your COVID test is positive, you will need to remain home until you have been fever free for at least 24 hours with no medication.  You will also need to wear a mask while you are having symptoms and while you are taking the medication.  If you continue to experience symptoms after completing the medication, you will need to wear your mask for an additional 5 days. Go to the emergency department immediately if you experience worsening shortness of breath, difficulty breathing, or other concerns. Please follow-up and let your primary care physician know if you test positive for COVID. Follow-up as needed.

## 2022-10-21 NOTE — ED Provider Notes (Signed)
[facility]    CSN: [id number] Arrival date & time: 10/21/22  1618      History   Chief Complaint No chief complaint on file.   HPI Adrian Bird is a 67 y.o. male.   The history is provided by the patient.   Patient presents for complaints of headache, nasal congestion, sore throat, and ear pain that been present for the past 3 days.  Patient denies fever, chills, ear drainage, wheezing, chest pain, abdominal pain, nausea, vomiting, or diarrhea.  Patient reports that he does have a history of COPD, and is also experiencing some shortness of breath.  He reports he has been taking his migraine medication with minimal relief.  Patient denies any obvious known sick contacts.  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   Essential tremor 10/15/2020   Anxiety disorder 09/16/2019   Chronic low back pain 09/16/2019   Long-term current use of opiate analgesic 09/16/2019   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/16/2019   Essential hypertension 08/20/2014   Arthritis 10/04/2012   Mixed hyperlipidemia 10/04/2012    Past Surgical History:  Procedure Laterality Date   BIOPSY  01/27/2022   Procedure: BIOPSY;  Surgeon: Marguerita Merles, Reuel Boom, MD;  Location: [facility];  Service: Gastroenterology;;   CYST REMOVAL NECK     CYST REMOVAL TRUNK     ESOPHAGOGASTRODUODENOSCOPY (EGD) WITH PROPOFOL N/A 01/27/2022   Procedure: ESOPHAGOGASTRODUODENOSCOPY (EGD) WITH PROPOFOL;  Surgeon: Dolores Frame, MD;  Location: [facility];  Service:  Gastroenterology;  Laterality: N/A;  1:30 pm, pt knows to arrive at 8:00   EYE SURGERY Bilateral    HERNIA REPAIR     KNEE SURGERY Left    TONSILLECTOMY         Home Medications    Prior to Admission medications   Medication Sig Start Date End Date Taking? Authorizing Provider  albuterol (VENTOLIN HFA) 108 (90 Base) MCG/ACT inhaler Inhale 2 puffs into the lungs every 6 (six) hours as needed for wheezing or shortness of breath. 10/21/22  Yes Laquanna Veazey-Warren, Sadie Haber, NP  fluticasone (FLONASE) 50 MCG/ACT nasal spray Place 2 sprays into both nostrils daily. 10/21/22  Yes Hanadi Stanly-Warren, Sadie Haber, NP  atorvastatin (LIPITOR) 80 MG tablet TAKE 1 TABLET BY MOUTH AT BEDTIME. 10/13/22   Cook, Dorie Rank G, DO  busPIRone (BUSPAR) 5 MG tablet take 1 tablet by mouth twice daily, as needed. 10/13/22   Tommie Sams, DO  carbamazepine (TEGRETOL) 200 MG tablet take 1 tablet by mouth 3 times daily. 08/25/22   Tommie Sams, DO  diltiazem (CARDIZEM CD) 120 MG 24 hr capsule TAKE 1 CAPSULE BY MOUTH ONCE DAILY. 10/13/22   Everlene Other G, DO  ezetimibe (ZETIA) 10 MG tablet TAKE 1 TABLET BY MOUTH AT BEDTIME. 10/13/22   Cook, Dorie Rank G, DO  famotidine (PEPCID) 20 MG tablet take (1) tablet twice daily. 10/13/22   Everlene Other G, DO  FLUoxetine (PROZAC) 10 MG capsule TAKE 1 CAPSULE BY MOUTH ONCE DAILY. 10/13/22  Cook, Jayce G, DO  gabapentin (NEURONTIN) 300 MG capsule take 1 capsule by mouth twice daily. 08/25/22   Tommie Sams, DO  levETIRAcetam (KEPPRA) 750 MG tablet take (1) tablet twice daily. 10/13/22   Everlene Other G, DO  lisinopril (ZESTRIL) 20 MG tablet TAKE 1 TABLET BY MOUTH EVERY MORNING. 10/13/22   Cook, Tarkio G, DO  montelukast (SINGULAIR) 10 MG tablet TAKE 1 TABLET BY MOUTH AT BEDTIME. 10/13/22   Tommie Sams, DO  Multiple Vitamin (MULTIVITAMIN WITH MINERALS) TABS tablet Take 1 tablet by mouth daily.    [provider]  omeprazole (PRILOSEC) 40 MG capsule TAKE 1 CAPSULE BY MOUTH TWICE DAILY. 06/07/22   Tommie Sams, DO   Oxycodone HCl 10 MG TABS Take 10 mg by mouth in the morning, at noon, in the evening, and at bedtime.    [provider]  polyethylene glycol (MIRALAX / GLYCOLAX) 17 g packet Take 17 g by mouth daily as needed for moderate constipation.    [provider]  tamsulosin (FLOMAX) 0.4 MG CAPS capsule TAKE 1 CAPSULE BY MOUTH ONCE DAILY. 10/13/22   Tommie Sams, DO  traZODone (DESYREL) 50 MG tablet Take 1 tablet (50 mg total) by mouth at bedtime. 09/26/22   Tommie Sams, DO  triamcinolone cream (KENALOG) 0.1 % Apply 1 Application topically 2 (two) times daily. 10/04/22   Kaelea Gathright-Warren, Sadie Haber, NP  umeclidinium-vilanterol (ANORO ELLIPTA) 62.5-25 MCG/ACT AEPB USE (1) PUFF DAILY AS DIRECTED. 10/11/22   Tommie Sams, DO    Family History Family History  Problem Relation Age of Onset   Congestive Heart Failure Mother    Lung cancer Father    Diabetes Sister    Diabetes Brother    Diabetes Maternal Aunt    Lung cancer Maternal Grandfather     Social History Social History   Tobacco Use   Smoking status: Every Day    Current packs/day: 0.25    Average packs/day: 0.3 packs/day for 50.0 years (12.5 ttl pk-yrs)    Types: E-cigarettes, Cigarettes   Smokeless tobacco: Never  Vaping Use   Vaping status: Some Days  Substance Use Topics   Alcohol use: No   Drug use: No     Allergies   Cephalexin, Doxycycline, Penicillins, and Sulfa antibiotics   Review of Systems Review of Systems Per HPI  Physical Exam Triage Vital Signs ED Triage Vitals [10/21/22 1826]  Encounter Vitals Group     BP (!) 189/97     Systolic BP Percentile      Diastolic BP Percentile      Pulse Rate 73     Resp 14     Temp 98 F (36.7 C)     Temp src      SpO2 95 %     Weight      Height      Head Circumference      Peak Flow      Pain Score 7     Pain Loc      Pain Education      Exclude from Growth Chart    No data found.  Updated Vital Signs BP (!) 189/97 (BP Location: Right Arm)    Pulse 73   Temp 98 F (36.7 C)   Resp 14   SpO2 95%   Visual Acuity Right Eye Distance:   Left Eye Distance:   Bilateral Distance:    Right Eye Near:   Left Eye Near:  Bilateral Near:     Physical Exam Vitals and nursing note reviewed.  Constitutional:      General: He is not in acute distress.    Appearance: Normal appearance.  HENT:     Head: Normocephalic.     Right Ear: Tympanic membrane, ear canal and external ear normal.     Left Ear: Tympanic membrane, ear canal and external ear normal.     Nose: Congestion present.     Mouth/Throat:     Mouth: Mucous membranes are moist.     Pharynx: No posterior oropharyngeal erythema.     Comments: Cobblestoning present to posterior oropharynyx Eyes:     Extraocular Movements: Extraocular movements intact.     Conjunctiva/sclera: Conjunctivae normal.     Pupils: Pupils are equal, round, and reactive to light.  Cardiovascular:     Rate and Rhythm: Normal rate and regular rhythm.     Pulses: Normal pulses.     Heart sounds: Normal heart sounds.  Pulmonary:     Effort: Pulmonary effort is normal. No respiratory distress.     Breath sounds: Normal breath sounds. No stridor. No wheezing, rhonchi or rales.  Abdominal:     General: Bowel sounds are normal.     Palpations: Abdomen is soft.     Tenderness: There is no abdominal tenderness.  Musculoskeletal:     Cervical back: Normal range of motion.  Lymphadenopathy:     Cervical: No cervical adenopathy.  Skin:    General: Skin is warm and dry.  Neurological:     General: No focal deficit present.     Mental Status: He is alert and oriented to person, place, and time.  Psychiatric:        Mood and Affect: Mood normal.        Behavior: Behavior normal.      UC Treatments / Results  Labs (all labs ordered are listed, but only abnormal results are displayed) Labs Reviewed  SARS CORONAVIRUS 2 (TAT 6-24 HRS)  CULTURE, GROUP A STREP [facility])  POCT RAPID STREP A (OFFICE)     EKG   Radiology No results found.  Procedures Procedures (including critical care time)  Medications Ordered in UC Medications - No data to display  Initial Impression / Assessment and Plan / UC Course  I have reviewed the triage vital signs and the nursing notes.  Pertinent labs & imaging results that were available during my care of the patient were reviewed by me and considered in my medical decision making (see chart for details).  The patient is well-appearing, he is in no acute distress, vital signs are stable.  Rapid strep test is negative.  Throat culture and COVID test are pending.  Symptoms appear to be consistent with a viral upper respiratory infection, suspect COVID given his symptoms.  Patient is a candidate to receive Paxlovid if his COVID test is positive (patient will need to hold atorvastatin for 2 weeks).  Symptomatic treatment provided with fluticasone 50 mcg nasal spray, and albuterol inhaler for his shortness of breath.  Supportive care recommendations were provided and discussed with the patient to include over-the-counter Tylenol for pain, fever, general discomfort, warm salt water gargles, and use of a humidifier in his bedroom at nighttime during sleep.  Patient was advised if his COVID test is positive, he will need to remain home until he has been fever free for at least 24 hours with no medication.  Patient was also advised if his COVID test is positive  to notify his primary care physician.  Patient is in agreement with this plan of care and verbalizes understanding.  All questions were answered.  Patient stable for discharge.  Final Clinical Impressions(s) / UC Diagnoses   Final diagnoses:  Viral upper respiratory tract infection with cough  Encounter for screening for COVID-19     Discharge Instructions      The rapid strep test is negative.  A throat culture and COVID test are pending.  As discussed, you will be contacted if the COVID test is  positive.  You will also need to hold your atorvastatin (Lipitor) for 2 weeks if you choose to have Paxlovid prescribed. Take medication as prescribed. Increase fluids and allow for plenty of rest. May take over-the-counter Tylenol as needed for pain, fever, general discomfort. Warm salt water gargles 3-4 times daily or as needed for throat pain or discomfort. Recommend a soft diet while throat pain persist. May use normal saline nasal spray throughout the day to help with nasal congestion. If your COVID test is positive, you will need to remain home until you have been fever free for at least 24 hours with no medication.  You will also need to wear a mask while you are having symptoms and while you are taking the medication.  If you continue to experience symptoms after completing the medication, you will need to wear your mask for an additional 5 days. Go to the emergency department immediately if you experience worsening shortness of breath, difficulty breathing, or other concerns. Please follow-up and let your primary care physician know if you test positive for COVID. Follow-up as needed.     ED Prescriptions     Medication Sig Dispense Auth. Provider   fluticasone (FLONASE) 50 MCG/ACT nasal spray Place 2 sprays into both nostrils daily. 16 g Evaline Waltman-Warren, Sadie Haber, NP   albuterol (VENTOLIN HFA) 108 (90 Base) MCG/ACT inhaler Inhale 2 puffs into the lungs every 6 (six) hours as needed for wheezing or shortness of breath. 8 g Avryl Roehm-Warren, Sadie Haber, NP      PDMP not reviewed this encounter.   Abran Cantor, NP 10/21/22 1927

## 2022-10-21 NOTE — ED Triage Notes (Signed)
Pt reports he has a bad headache, throat pain, and ear pain  x 3 days.  Took migraine but only slight relief.  Pt feels like he is short of breath in his chest

## 2022-10-22 LAB — SARS CORONAVIRUS 2 (TAT 6-24 HRS): SARS Coronavirus 2: NEGATIVE

## 2022-10-24 ENCOUNTER — Other Ambulatory Visit: Payer: Self-pay | Admitting: Family Medicine

## 2022-10-24 LAB — CULTURE, GROUP A STREP (THRC)

## 2022-11-02 ENCOUNTER — Ambulatory Visit: Payer: 59 | Admitting: Family Medicine

## 2022-11-02 VITALS — BP 133/76 | HR 58 | Temp 97.8°F | Wt 198.6 lb

## 2022-11-02 DIAGNOSIS — G4733 Obstructive sleep apnea (adult) (pediatric): Secondary | ICD-10-CM | POA: Diagnosis not present

## 2022-11-02 DIAGNOSIS — Z8669 Personal history of other diseases of the nervous system and sense organs: Secondary | ICD-10-CM

## 2022-11-02 DIAGNOSIS — K219 Gastro-esophageal reflux disease without esophagitis: Secondary | ICD-10-CM

## 2022-11-02 MED ORDER — BUSPIRONE HCL 5 MG PO TABS: 5.0000 mg | ORAL_TABLET | Freq: Two times a day (BID) | ORAL | 1 refills | Status: DC | PRN

## 2022-11-02 MED ORDER — ALBUTEROL SULFATE HFA 108 (90 BASE) MCG/ACT IN AERS: 2.0000 | INHALATION_SPRAY | Freq: Four times a day (QID) | RESPIRATORY_TRACT | 0 refills | Status: DC | PRN

## 2022-11-02 MED ORDER — CARBAMAZEPINE 200 MG PO TABS: 200.0000 mg | ORAL_TABLET | Freq: Three times a day (TID) | ORAL | 3 refills | Status: DC

## 2022-11-02 MED ORDER — ATORVASTATIN CALCIUM 80 MG PO TABS: ORAL_TABLET | ORAL | 3 refills | Status: DC

## 2022-11-02 MED ORDER — LEVETIRACETAM 750 MG PO TABS: ORAL_TABLET | ORAL | 3 refills | Status: DC

## 2022-11-02 MED ORDER — FAMOTIDINE 20 MG PO TABS: ORAL_TABLET | ORAL | 3 refills | Status: DC

## 2022-11-02 MED ORDER — DILTIAZEM HCL ER COATED BEADS 120 MG PO CP24: 120.0000 mg | ORAL_CAPSULE | Freq: Every day | ORAL | 3 refills | Status: DC

## 2022-11-02 MED ORDER — EZETIMIBE 10 MG PO TABS: ORAL_TABLET | ORAL | 3 refills | Status: DC

## 2022-11-02 MED ORDER — SUCRALFATE 1 G PO TABS: 1.0000 g | ORAL_TABLET | Freq: Three times a day (TID) | ORAL | 1 refills | Status: DC

## 2022-11-02 NOTE — Patient Instructions (Signed)
Medications as prescribed.  Follow up in 2 months.

## 2022-11-02 NOTE — Assessment & Plan Note (Signed)
Medications refilled

## 2022-11-02 NOTE — Assessment & Plan Note (Signed)
Patient is compliant with CPAP.  Continue.

## 2022-11-02 NOTE — Assessment & Plan Note (Signed)
Recently uncontrolled due to dietary indiscretion.  Adding Carafate.

## 2022-11-02 NOTE — Progress Notes (Signed)
Subjective:  Patient ID: Adrian Bird, male    DOB: January 30, 1956  Age: 67 y.o. MRN: [id number]  CC: Follow up CPAP   HPI:  68 year old male with below mentioned medical problems presents for evaluation of the above.  Patient was told that he needed a follow-up visit regarding CPAP to ensure compliance.  He endorses compliance.  He is sleeping well.  He has no issues or concerns regarding this.  Patient states that he has recently had some heartburn/chest burning.  He states that he was previously prescribed Carafate and does not have this anymore.  He is taking both H2 blocker and PPI.  He states that this was from dietary indiscretion.  Patient has longstanding history of seizure.  Needs refill on his medications.  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/2020   Essential tremor 10/15/2020   Anxiety disorder 09/16/2019   Chronic low back pain 09/16/2019   Long-term current use of opiate analgesic 09/16/2019   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/16/2019   Essential hypertension 08/20/2014   Arthritis 10/04/2012   Mixed hyperlipidemia 10/04/2012    Social Hx   Social History   Socioeconomic History   Marital status: Married    Spouse name: Not on file   Number of children: Not on file   Years of education: Not on file   Highest education level: Not on file  Occupational History   Not on file  Tobacco Use   Smoking status: Every Day    Current packs/day: 0.25    Average packs/day: 0.3 packs/day for 50.0 years (12.5 ttl pk-yrs)    Types: E-cigarettes, Cigarettes   Smokeless tobacco: Never  Vaping Use   Vaping status: Some Days  Substance and Sexual Activity   Alcohol use: No   Drug  use: No   Sexual activity: Yes  Other Topics Concern   Not on file  Social History Narrative   Not on file   Social Determinants of Health   Financial Resource Strain: Low Risk  (04/22/2022)   Overall Financial Resource Strain (CARDIA)    Difficulty of Paying Living Expenses: Not hard at all  Food Insecurity: No Food Insecurity (04/22/2022)   Hunger Vital Sign    Worried About Running Out of Food in the Last Year: Never true    Ran Out of Food in the Last Year: Never true  Transportation Needs: No Transportation Needs (04/22/2022)   PRAPARE - Administrator, Civil Service (Medical): No    Lack of Transportation (Non-Medical): No  Physical Activity: Inactive (04/22/2022)   Exercise Vital Sign    Days of Exercise per Week: 0 days    Minutes of Exercise per Session: 0 min  Stress: No Stress Concern Present (04/22/2022)   [employer] of Occupational Health - Occupational Stress Questionnaire    Feeling of Stress : Only a little  Social Connections: Unknown (04/22/2022)   Social Connection and Isolation Panel [NHANES]    Frequency of Communication with Friends and Family: Three times a week    Frequency of Social Gatherings with Friends and Family: Once a week    Attends Religious Services: Never    Database administrator or Organizations: No  Attends Banker Meetings: Never    Marital Status: Patient declined    Review of Systems Per HPI  Objective:  BP 133/76   Pulse (!) 58   Temp 97.8 F (36.6 C) (Oral)   Wt 198 lb 9.6 oz (90.1 kg)   SpO2 97%   BMI 30.20 kg/m      11/02/2022   10:26 AM 10/21/2022    6:26 PM 10/04/2022    3:19 PM  BP/Weight  Systolic BP 133 189 173  Diastolic BP 76 97 89  Wt. (Lbs) 198.6    BMI 30.2 kg/m2      Physical Exam Constitutional:      General: He is not in acute distress.    Appearance: Normal appearance.  HENT:     Head: Normocephalic and atraumatic.  Eyes:     General:        Right eye: No  discharge.        Left eye: No discharge.     Conjunctiva/sclera: Conjunctivae normal.  Cardiovascular:     Rate and Rhythm: Normal rate and regular rhythm.  Pulmonary:     Effort: Pulmonary effort is normal.     Breath sounds: Normal breath sounds. No wheezing, rhonchi or rales.  Neurological:     Mental Status: He is alert.     Lab Results  Component Value Date   WBC 5.7 09/26/2022   HGB 14.3 09/26/2022   HCT 42.5 09/26/2022   PLT 276 09/26/2022   GLUCOSE 88 09/26/2022   CHOL 126 09/26/2022   TRIG 75 09/26/2022   HDL 59 09/26/2022   LDLCALC 52 09/26/2022   ALT 27 09/26/2022   AST 21 09/26/2022   NA 130 (L) 09/26/2022   K 4.7 09/26/2022   CL 90 (L) 09/26/2022   CREATININE 0.62 (L) 09/26/2022   BUN 5 (L) 09/26/2022   CO2 24 09/26/2022   HGBA1C 5.9 (H) 07/26/2021     Assessment & Plan:   Problem List Items Addressed This Visit       Respiratory   OSA on CPAP - Primary    Patient is compliant with CPAP.  Continue.        Digestive   Gastroesophageal reflux disease    Recently uncontrolled due to dietary indiscretion.  Adding Carafate.      Relevant Medications   famotidine (PEPCID) 20 MG tablet   sucralfate (CARAFATE) 1 g tablet     Other   History of epilepsy    Medications refilled.       Meds ordered this encounter  Medications   albuterol (VENTOLIN HFA) 108 (90 Base) MCG/ACT inhaler    Sig: Inhale 2 puffs into the lungs every 6 (six) hours as needed for wheezing or shortness of breath.    Dispense:  8 g    Refill:  0   atorvastatin (LIPITOR) 80 MG tablet    Sig: TAKE 1 TABLET BY MOUTH AT BEDTIME.    Dispense:  90 tablet    Refill:  3    NA   busPIRone (BUSPAR) 5 MG tablet    Sig: Take 1 tablet (5 mg total) by mouth 2 (two) times daily as needed.    Dispense:  180 tablet    Refill:  1    NA   diltiazem (CARDIZEM CD) 120 MG 24 hr capsule    Sig: Take 1 capsule (120 mg total) by mouth daily.    Dispense:  90 capsule    Refill:  3    NA    carbamazepine (TEGRETOL) 200 MG tablet    Sig: Take 1 tablet (200 mg total) by mouth 3 (three) times daily.    Dispense:  270 tablet    Refill:  3    NA   ezetimibe (ZETIA) 10 MG tablet    Sig: TAKE 1 TABLET BY MOUTH AT BEDTIME.    Dispense:  90 tablet    Refill:  3    NA   famotidine (PEPCID) 20 MG tablet    Sig: TAKE (1) TABLET TWICE DAILY.    Dispense:  180 tablet    Refill:  3    NA   levETIRAcetam (KEPPRA) 750 MG tablet    Sig: Take (1) tablet twice daily.    Dispense:  180 tablet    Refill:  3    NA   sucralfate (CARAFATE) 1 g tablet    Sig: Take 1 tablet (1 g total) by mouth in the morning, at noon, and at bedtime.    Dispense:  270 tablet    Refill:  1    Follow-up:  2 months  Ryatt Corsino Adriana Simas DO [facility]

## 2022-11-04 DIAGNOSIS — G4733 Obstructive sleep apnea (adult) (pediatric): Secondary | ICD-10-CM | POA: Diagnosis not present

## 2022-11-07 ENCOUNTER — Other Ambulatory Visit: Payer: Self-pay | Admitting: Family Medicine

## 2022-11-10 ENCOUNTER — Other Ambulatory Visit: Payer: Self-pay | Admitting: Family Medicine

## 2022-11-14 DIAGNOSIS — Z79899 Other long term (current) drug therapy: Secondary | ICD-10-CM | POA: Diagnosis not present

## 2022-11-14 DIAGNOSIS — G894 Chronic pain syndrome: Secondary | ICD-10-CM | POA: Diagnosis not present

## 2022-11-14 DIAGNOSIS — Z79891 Long term (current) use of opiate analgesic: Secondary | ICD-10-CM | POA: Diagnosis not present

## 2022-11-14 DIAGNOSIS — M5459 Other low back pain: Secondary | ICD-10-CM | POA: Diagnosis not present

## 2022-11-14 DIAGNOSIS — M62830 Muscle spasm of back: Secondary | ICD-10-CM | POA: Diagnosis not present

## 2022-11-17 DIAGNOSIS — G4733 Obstructive sleep apnea (adult) (pediatric): Secondary | ICD-10-CM | POA: Diagnosis not present

## 2022-11-21 ENCOUNTER — Other Ambulatory Visit: Payer: Self-pay | Admitting: Family Medicine

## 2022-12-13 ENCOUNTER — Other Ambulatory Visit: Payer: Self-pay | Admitting: Family Medicine

## 2022-12-18 DIAGNOSIS — G4733 Obstructive sleep apnea (adult) (pediatric): Secondary | ICD-10-CM | POA: Diagnosis not present

## 2023-01-02 ENCOUNTER — Ambulatory Visit: Payer: 59 | Admitting: Family Medicine

## 2023-01-09 ENCOUNTER — Other Ambulatory Visit: Payer: Self-pay | Admitting: Family Medicine

## 2023-01-09 DIAGNOSIS — M62830 Muscle spasm of back: Secondary | ICD-10-CM | POA: Diagnosis not present

## 2023-01-09 DIAGNOSIS — M5459 Other low back pain: Secondary | ICD-10-CM | POA: Diagnosis not present

## 2023-01-09 DIAGNOSIS — G894 Chronic pain syndrome: Secondary | ICD-10-CM | POA: Diagnosis not present

## 2023-01-09 DIAGNOSIS — Z79891 Long term (current) use of opiate analgesic: Secondary | ICD-10-CM | POA: Diagnosis not present

## 2023-01-09 DIAGNOSIS — Z79899 Other long term (current) drug therapy: Secondary | ICD-10-CM | POA: Diagnosis not present

## 2023-01-12 ENCOUNTER — Encounter: Payer: Self-pay | Admitting: Family Medicine

## 2023-01-12 ENCOUNTER — Ambulatory Visit (INDEPENDENT_AMBULATORY_CARE_PROVIDER_SITE_OTHER): Payer: 59 | Admitting: Family Medicine

## 2023-01-12 ENCOUNTER — Other Ambulatory Visit: Payer: Self-pay | Admitting: Family Medicine

## 2023-01-12 VITALS — BP 120/50 | HR 86 | Ht 68.0 in | Wt 201.0 lb

## 2023-01-12 DIAGNOSIS — J4489 Other specified chronic obstructive pulmonary disease: Secondary | ICD-10-CM | POA: Diagnosis not present

## 2023-01-12 DIAGNOSIS — I10 Essential (primary) hypertension: Secondary | ICD-10-CM | POA: Diagnosis not present

## 2023-01-12 DIAGNOSIS — J439 Emphysema, unspecified: Secondary | ICD-10-CM

## 2023-01-12 DIAGNOSIS — K219 Gastro-esophageal reflux disease without esophagitis: Secondary | ICD-10-CM | POA: Diagnosis not present

## 2023-01-12 DIAGNOSIS — G4733 Obstructive sleep apnea (adult) (pediatric): Secondary | ICD-10-CM | POA: Diagnosis not present

## 2023-01-12 MED ORDER — TAMSULOSIN HCL 0.4 MG PO CAPS: 0.4000 mg | ORAL_CAPSULE | Freq: Every day | ORAL | 3 refills | Status: DC

## 2023-01-12 MED ORDER — TRAZODONE HCL 50 MG PO TABS: 50.0000 mg | ORAL_TABLET | Freq: Every day | ORAL | 3 refills | Status: DC

## 2023-01-12 MED ORDER — LISINOPRIL 20 MG PO TABS: 20.0000 mg | ORAL_TABLET | Freq: Every morning | ORAL | 3 refills | Status: DC

## 2023-01-12 MED ORDER — DEXLANSOPRAZOLE 60 MG PO CPDR: 60.0000 mg | DELAYED_RELEASE_CAPSULE | Freq: Every day | ORAL | 3 refills | Status: DC

## 2023-01-12 NOTE — Patient Instructions (Signed)
Trial of Dexilant.  Call GI for follow up.   Follow up in 6 months.

## 2023-01-13 NOTE — Assessment & Plan Note (Signed)
Stable on CPAP 

## 2023-01-13 NOTE — Assessment & Plan Note (Signed)
Stable.  Continue current medications.

## 2023-01-13 NOTE — Assessment & Plan Note (Signed)
Stable

## 2023-01-13 NOTE — Assessment & Plan Note (Addendum)
Uncontrolled/worsening.  Has a history of peptic ulcer disease.  Starting Dexilant.  Needs follow-up with GI.

## 2023-01-13 NOTE — Progress Notes (Signed)
Subjective:  Patient ID: Adrian Bird, male    DOB: 1955/11/04  Age: 67 y.o. MRN: [id number]  CC:   Chief Complaint  Patient presents with   heart burn and vomiting   cpap follow up    HPI:  67 year old male presents for follow-up.  Hypertension is well-controlled.  He is on diltiazem and lisinopril.  COPD stable on Anoro.  Patient reports persistent GERD symptoms despite use of Pepcid and omeprazole.  He was previously on Carafate.  I am not sure if he is taking this or not.  He also reports some abdominal burning.  Previously had EGD in December 2023.  Notes reflect that he is supposed to have follow-up EGD.  This has not been done.  Compliant with CPAP.  Patient Active Problem List   Diagnosis Date Noted   Insomnia 09/26/2022   Gastroesophageal reflux disease 11/03/2021   History of epilepsy 07/26/2021   OSA on CPAP 12/15/2020   Tobacco abuse 12/15/2020   COPD with chronic bronchitis and emphysema (HCC) 12/15/2020   Essential tremor 10/15/2020   Anxiety disorder 09/16/2019   Chronic low back pain 09/16/2019   Long-term current use of opiate analgesic 09/16/2019   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/16/2019   Essential hypertension 08/20/2014   Arthritis 10/04/2012   Mixed hyperlipidemia 10/04/2012    Social Hx   Social History   Socioeconomic History   Marital status: Married    Spouse name: Not on file   Number of children: Not on file   Years of education: Not on file   Highest education level: Not on file  Occupational History   Not on file  Tobacco Use   Smoking status: Every Day    Current packs/day: 0.25    Average packs/day: 0.3 packs/day for 50.0 years (12.5 ttl pk-yrs)    Types: E-cigarettes, Cigarettes   Smokeless tobacco: Never  Vaping Use   Vaping status: Some Days  Substance and Sexual Activity   Alcohol use: No   Drug use: No   Sexual activity: Yes  Other Topics Concern   Not on file  Social History Narrative   Not  on file   Social Determinants of Health   Financial Resource Strain: Low Risk  (04/22/2022)   Overall Financial Resource Strain (CARDIA)    Difficulty of Paying Living Expenses: Not hard at all  Food Insecurity: No Food Insecurity (04/22/2022)   Hunger Vital Sign    Worried About Running Out of Food in the Last Year: Never true    Ran Out of Food in the Last Year: Never true  Transportation Needs: No Transportation Needs (04/22/2022)   PRAPARE - Administrator, Civil Service (Medical): No    Lack of Transportation (Non-Medical): No  Physical Activity: Inactive (04/22/2022)   Exercise Vital Sign    Days of Exercise per Week: 0 days    Minutes of Exercise per Session: 0 min  Stress: No Stress Concern Present (04/22/2022)   [employer] of Occupational Health - Occupational Stress Questionnaire    Feeling of Stress : Only a little  Social Connections: Unknown (04/22/2022)   Social Connection and Isolation Panel [NHANES]    Frequency of Communication with Friends and Family: Three times a week    Frequency of Social Gatherings with Friends and Family: Once a week    Attends Religious Services: Never    Database administrator or Organizations: No    Attends Banker Meetings: Never  Marital Status: Patient declined    Review of Systems Per HPI  Objective:  BP (!) 120/50   Pulse 86   Ht 5\' 8"  (1.727 m)   Wt 201 lb (91.2 kg)   SpO2 95%   BMI 30.56 kg/m      01/12/2023   11:15 AM 11/02/2022   10:26 AM 10/21/2022    6:26 PM  BP/Weight  Systolic BP 120 133 189  Diastolic BP 50 76 97  Wt. (Lbs) 201 198.6   BMI 30.56 kg/m2 30.2 kg/m2     Physical Exam Vitals and nursing note reviewed.  Constitutional:      General: He is not in acute distress. HENT:     Head: Normocephalic and atraumatic.  Cardiovascular:     Rate and Rhythm: Normal rate and regular rhythm.  Pulmonary:     Effort: Pulmonary effort is normal.     Breath sounds: Normal breath  sounds. No wheezing or rales.  Abdominal:     General: There is no distension.     Palpations: Abdomen is soft.     Tenderness: There is no abdominal tenderness.  Neurological:     Mental Status: He is alert.     Lab Results  Component Value Date   WBC 5.7 09/26/2022   HGB 14.3 09/26/2022   HCT 42.5 09/26/2022   PLT 276 09/26/2022   GLUCOSE 88 09/26/2022   CHOL 126 09/26/2022   TRIG 75 09/26/2022   HDL 59 09/26/2022   LDLCALC 52 09/26/2022   ALT 27 09/26/2022   AST 21 09/26/2022   NA 130 (L) 09/26/2022   K 4.7 09/26/2022   CL 90 (L) 09/26/2022   CREATININE 0.62 (L) 09/26/2022   BUN 5 (L) 09/26/2022   CO2 24 09/26/2022   HGBA1C 5.9 (H) 07/26/2021     Assessment & Plan:   Problem List Items Addressed This Visit       Cardiovascular and Mediastinum   Essential hypertension    Stable.  Continue current medications.      Relevant Medications   lisinopril (ZESTRIL) 20 MG tablet     Respiratory   OSA on CPAP    Stable on CPAP.      COPD with chronic bronchitis and emphysema (HCC)    Stable.        Digestive   Gastroesophageal reflux disease - Primary    Uncontrolled/worsening.  Has a history of peptic ulcer disease.  Starting Dexilant.  Needs follow-up with GI.      Relevant Medications   dexlansoprazole (DEXILANT) 60 MG capsule    Meds ordered this encounter  Medications   dexlansoprazole (DEXILANT) 60 MG capsule    Sig: Take 1 capsule (60 mg total) by mouth daily.    Dispense:  30 capsule    Refill:  3   lisinopril (ZESTRIL) 20 MG tablet    Sig: Take 1 tablet (20 mg total) by mouth every morning.    Dispense:  90 tablet    Refill:  3    NA   tamsulosin (FLOMAX) 0.4 MG CAPS capsule    Sig: Take 1 capsule (0.4 mg total) by mouth daily.    Dispense:  90 capsule    Refill:  3    NA   traZODone (DESYREL) 50 MG tablet    Sig: Take 1 tablet (50 mg total) by mouth at bedtime.    Dispense:  90 tablet    Refill:  3    NA  Follow-up:  Return  in about 6 months (around 07/13/2023).  Everlene Other DO [facility]

## 2023-01-17 DIAGNOSIS — G4733 Obstructive sleep apnea (adult) (pediatric): Secondary | ICD-10-CM | POA: Diagnosis not present

## 2023-01-27 DIAGNOSIS — G4733 Obstructive sleep apnea (adult) (pediatric): Secondary | ICD-10-CM | POA: Diagnosis not present

## 2023-02-09 ENCOUNTER — Other Ambulatory Visit: Payer: Self-pay | Admitting: Family Medicine

## 2023-02-09 DIAGNOSIS — G8929 Other chronic pain: Secondary | ICD-10-CM

## 2023-02-11 ENCOUNTER — Other Ambulatory Visit: Payer: Self-pay | Admitting: Family Medicine

## 2023-02-11 DIAGNOSIS — J4489 Other specified chronic obstructive pulmonary disease: Secondary | ICD-10-CM

## 2023-02-11 DIAGNOSIS — J439 Emphysema, unspecified: Secondary | ICD-10-CM

## 2023-02-13 ENCOUNTER — Other Ambulatory Visit: Payer: Self-pay | Admitting: Family Medicine

## 2023-02-13 ENCOUNTER — Ambulatory Visit: Payer: Self-pay | Admitting: Family Medicine

## 2023-02-13 MED ORDER — SUCRALFATE 1 G PO TABS: 1.0000 g | ORAL_TABLET | Freq: Three times a day (TID) | ORAL | 0 refills | Status: DC

## 2023-02-13 NOTE — Telephone Encounter (Signed)
 Answer Assessment - Initial Assessment Questions 1. DRUG NAME: What medicine do you need to have refilled?     Anoro Inhaler and Sucralfate   2. REFILLS REMAINING: How many refills are remaining? (Note: The label on the medicine or pill bottle will show how many refills are remaining. If there are no refills remaining, then a renewal may be needed.)     No refills remaining  3. PRESCRIBING HCP: Who prescribed it? Reason: If prescribed by specialist, call should be referred to that group.     Dr. Bluford  4. SYMPTOMS: Do you have any symptoms?     No   Patient and his daughter Barnie were both present during this phone call. His daughter requested refills for Anoro inhaler and Sucralfate . She stated that she has reached out to the pharmacy and they stated there are no further refills so new prescriptions need to be sent by the office. This RN notified that refill requests can take up to 3 business days.  Upon review of the chart, it looks like the inhaler has already been called in today.  Protocols used: Medication Refill and Renewal Call-A-AH

## 2023-02-17 DIAGNOSIS — G4733 Obstructive sleep apnea (adult) (pediatric): Secondary | ICD-10-CM | POA: Diagnosis not present

## 2023-03-08 ENCOUNTER — Other Ambulatory Visit: Payer: Self-pay | Admitting: Family Medicine

## 2023-03-08 DIAGNOSIS — J439 Emphysema, unspecified: Secondary | ICD-10-CM

## 2023-03-10 ENCOUNTER — Other Ambulatory Visit: Payer: Self-pay | Admitting: Family Medicine

## 2023-03-20 DIAGNOSIS — G4733 Obstructive sleep apnea (adult) (pediatric): Secondary | ICD-10-CM | POA: Diagnosis not present

## 2023-03-23 ENCOUNTER — Ambulatory Visit
Admission: EM | Admit: 2023-03-23 | Discharge: 2023-03-23 | Disposition: A | Discharge status: Home / Self Care | Payer: 59 | Attending: Family Medicine | Admitting: Family Medicine

## 2023-03-23 DIAGNOSIS — J069 Acute upper respiratory infection, unspecified: Secondary | ICD-10-CM | POA: Diagnosis not present

## 2023-03-23 LAB — POCT RAPID STREP A (OFFICE): Rapid Strep A Screen: NEGATIVE

## 2023-03-23 LAB — POC COVID19/FLU A&B COMBO
Covid Antigen, POC: NEGATIVE
Influenza A Antigen, POC: NEGATIVE
Influenza B Antigen, POC: NEGATIVE

## 2023-03-23 MED ORDER — FLUTICASONE PROPIONATE 50 MCG/ACT NA SUSP: 1.0000 | Freq: Two times a day (BID) | NASAL | 0 refills | Status: DC

## 2023-03-23 NOTE — Discharge Instructions (Addendum)
You have a viral upper respiratory infection.  Symptoms should improve over the next week to 10 days.  If you develop chest pain or shortness of breath, go to the emergency room.  COVID-19 and influenza tests are negative today.  Some things that can make you feel better are: - Increased rest - Increasing fluid with water/sugar free electrolytes - Acetaminophen and ibuprofen as needed for fever/pain - Salt water gargling, chloraseptic spray and throat lozenges - OTC guaifenesin (Mucinex) 600 mg twice daily for congestion - Saline sinus flushes or a neti pot - Humidifying the air - Flonase nasal spray twice daily while sick

## 2023-03-23 NOTE — ED Triage Notes (Signed)
Pt reports he has some bilateral ear pain and sore throat x 1 day

## 2023-03-23 NOTE — ED Provider Notes (Signed)
[facility]    CSN: [id number] Arrival date & time: 03/23/23  1505      History   Chief Complaint No chief complaint on file.   HPI Adrian Bird is a 68 y.o. male.   Patient presents today with granddaughter for 1 day history of chills, shortness of breath, runny/stuffy nose, sore throat, and ear pain.  He also endorses cough and headache that are not new for him.  No fever, chest pain, abdominal pain, nausea/vomiting, or diarrhea.  Has not taken anything for symptoms so far.    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/2020   Essential tremor 10/15/2020   Anxiety disorder 09/16/2019   Chronic low back pain 09/16/2019   Long-term current use of opiate analgesic 09/16/2019   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/16/2019   Essential hypertension 08/20/2014   Arthritis 10/04/2012   Mixed hyperlipidemia 10/04/2012    Past Surgical History:  Procedure Laterality Date   BIOPSY  01/27/2022   Procedure: BIOPSY;  Surgeon: Marguerita Merles, Reuel Boom, MD;  [facility]: [facility];  Service: Gastroenterology;;   CYST REMOVAL NECK     CYST REMOVAL TRUNK     ESOPHAGOGASTRODUODENOSCOPY (EGD) WITH PROPOFOL N/A 01/27/2022   Procedure: ESOPHAGOGASTRODUODENOSCOPY (EGD) WITH PROPOFOL;  Surgeon: Dolores Frame, MD;  Location: [facility];  Service: Gastroenterology;  Laterality: N/A;  1:30 pm, pt knows to arrive at 8:00   EYE SURGERY Bilateral    HERNIA REPAIR     KNEE SURGERY Left    TONSILLECTOMY         Home Medications    Prior to Admission medications   Medication Sig Start Date  End Date Taking? Authorizing Provider  fluticasone (FLONASE) 50 MCG/ACT nasal spray Place 1 spray into both nostrils in the morning and at bedtime. 03/23/23  Yes Valentino Nose, NP  albuterol (VENTOLIN HFA) 108 (90 Base) MCG/ACT inhaler inhale 2 puffs into the lungs every 6 (six) hours as needed. for wheezing 11/11/22   Tommie Sams, DO  atorvastatin (LIPITOR) 80 MG tablet TAKE 1 TABLET BY MOUTH AT BEDTIME. 11/02/22   Cook, Dorie Rank G, DO  busPIRone (BUSPAR) 5 MG tablet Take 1 tablet (5 mg total) by mouth 2 (two) times daily as needed. 11/02/22   Tommie Sams, DO  carbamazepine (TEGRETOL) 200 MG tablet Take 1 tablet (200 mg total) by mouth 3 (three) times daily. 11/02/22   Tommie Sams, DO  dexlansoprazole (DEXILANT) 60 MG capsule Take 1 capsule (60 mg total) by mouth daily. 01/12/23   Tommie Sams, DO  diltiazem (CARDIZEM CD) 120 MG 24 hr capsule Take 1 capsule (120 mg total) by mouth daily. 11/02/22   Tommie Sams, DO  ezetimibe (ZETIA) 10 MG tablet TAKE 1 TABLET BY MOUTH AT BEDTIME. 11/02/22   Cook, Dorie Rank G, DO  famotidine (PEPCID) 20 MG tablet TAKE (1) TABLET TWICE DAILY. 11/02/22   Tommie Sams, DO  FLUoxetine (PROZAC) 10 MG capsule TAKE 1 CAPSULE BY MOUTH ONCE DAILY. 10/13/22   Tommie Sams,  DO  fluticasone (FLONASE) 50 MCG/ACT nasal spray Place 2 sprays into both nostrils daily. 10/21/22   Leath-Warren, Sadie Haber, NP  gabapentin (NEURONTIN) 300 MG capsule take 1 capsule by mouth twice daily. 02/10/23   Everlene Other G, DO  glucose blood (ACCU-CHEK GUIDE TEST) test strip use to check blood sugar 3 times daily. 03/10/23   Tommie Sams, DO  levETIRAcetam (KEPPRA) 750 MG tablet Take (1) tablet twice daily. 11/02/22   Tommie Sams, DO  linaclotide (LINZESS) 290 MCG CAPS capsule TAKE 1 CAPSULE BY MOUTH EVERY MORNING. 01/10/23   Everlene Other G, DO  lisinopril (ZESTRIL) 20 MG tablet Take 1 tablet (20 mg total) by mouth every morning. 01/12/23   Everlene Other G, DO  montelukast (SINGULAIR) 10 MG tablet take 1  tablet by mouth at bedtime. 03/08/23   Tommie Sams, DO  Multiple Vitamin (MULTIVITAMIN WITH MINERALS) TABS tablet Take 1 tablet by mouth daily.    [provider]  Oxycodone HCl 10 MG TABS Take 10 mg by mouth in the morning, at noon, in the evening, and at bedtime.    [provider]  sucralfate (CARAFATE) 1 g tablet Take 1 tablet (1 g total) by mouth with breakfast, with lunch, and with evening meal. 02/13/23   Tommie Sams, DO  tamsulosin (FLOMAX) 0.4 MG CAPS capsule Take 1 capsule (0.4 mg total) by mouth daily. 01/12/23   Tommie Sams, DO  traZODone (DESYREL) 50 MG tablet Take 1 tablet (50 mg total) by mouth at bedtime. 01/12/23   Tommie Sams, DO  triamcinolone cream (KENALOG) 0.1 % Apply 1 Application topically 2 (two) times daily. 10/04/22   Leath-Warren, Sadie Haber, NP  umeclidinium-vilanterol (ANORO ELLIPTA) 62.5-25 MCG/ACT AEPB use (1) puff daily as directed. 03/08/23   Tommie Sams, DO    Family History Family History  Problem Relation Age of Onset   Congestive Heart Failure Mother    Lung cancer Father    Diabetes Sister    Diabetes Brother    Diabetes Maternal Aunt    Lung cancer Maternal Grandfather     Social History Social History   Tobacco Use   Smoking status: Every Day    Current packs/day: 0.25    Average packs/day: 0.3 packs/day for 50.0 years (12.5 ttl pk-yrs)    Types: E-cigarettes, Cigarettes   Smokeless tobacco: Never  Vaping Use   Vaping status: Some Days  Substance Use Topics   Alcohol use: No   Drug use: No     Allergies   Cephalexin, Doxycycline, Penicillins, and Sulfa antibiotics   Review of Systems Review of Systems Per HPI  Physical Exam Triage Vital Signs ED Triage Vitals  Encounter Vitals Group     BP 03/23/23 1512 (!) 159/87     Systolic BP Percentile --      Diastolic BP Percentile --      Pulse Rate 03/23/23 1512 75     Resp 03/23/23 1512 18     Temp 03/23/23 1514 98.5 F (36.9 C)     Temp Source 03/23/23  1512 Oral     SpO2 03/23/23 1512 94 %     Weight --      Height --      Head Circumference --      Peak Flow --      Pain Score 03/23/23 1514 5     Pain Loc --      Pain Education --  Exclude from Growth Chart --    No data found.  Updated Vital Signs BP (!) 159/87 (BP Location: Right Arm)   Pulse 75   Temp 98.5 F (36.9 C) (Oral)   Resp 18   SpO2 94%   Visual Acuity Right Eye Distance:   Left Eye Distance:   Bilateral Distance:    Right Eye Near:   Left Eye Near:    Bilateral Near:     Physical Exam Vitals and nursing note reviewed.  Constitutional:      General: He is not in acute distress.    Appearance: Normal appearance. He is not ill-appearing or toxic-appearing.  HENT:     Head: Normocephalic and atraumatic.     Right Ear: Tympanic membrane, ear canal and external ear normal. No middle ear effusion. Tympanic membrane is not erythematous or bulging.     Left Ear: Tympanic membrane, ear canal and external ear normal.  No middle ear effusion. Tympanic membrane is not erythematous or bulging.     Nose: No congestion or rhinorrhea.     Right Turbinates: Enlarged and swollen.     Left Turbinates: Enlarged and swollen.     Mouth/Throat:     Mouth: Mucous membranes are moist.     Pharynx: Oropharynx is clear. No oropharyngeal exudate or posterior oropharyngeal erythema.  Eyes:     General: No scleral icterus.    Extraocular Movements: Extraocular movements intact.  Cardiovascular:     Rate and Rhythm: Normal rate and regular rhythm.  Pulmonary:     Effort: Pulmonary effort is normal. No respiratory distress.     Breath sounds: Normal breath sounds. No wheezing, rhonchi or rales.  Musculoskeletal:     Cervical back: Normal range of motion and neck supple.  Lymphadenopathy:     Cervical: No cervical adenopathy.  Skin:    General: Skin is warm and dry.     Coloration: Skin is not jaundiced or pale.     Findings: No erythema or rash.  Neurological:      Mental Status: He is alert and oriented to person, place, and time.  Psychiatric:        Behavior: Behavior is cooperative.      UC Treatments / Results  Labs (all labs ordered are listed, but only abnormal results are displayed) Labs Reviewed  POCT RAPID STREP A (OFFICE)  POC COVID19/FLU A&B COMBO    EKG   Radiology No results found.  Procedures Procedures (including critical care time)  Medications Ordered in UC Medications - No data to display  Initial Impression / Assessment and Plan / UC Course  I have reviewed the triage vital signs and the nursing notes.  Pertinent labs & imaging results that were available during my care of the patient were reviewed by me and considered in my medical decision making (see chart for details).   Patient is well-appearing, afebrile, not tachycardic, not tachypneic, oxygenating well on room air.  Patient is mildly hypertensive in triage today.  1. Viral URI with cough Vitals and examination are reassuring today COVID-19 and influenza testing is negative; rapid strep throat test also negative Suspect alternate viral etiology Supportive care discussed with patient Increase Flonase to twice daily for the next few days Return/ER precautions discussed   The patient was given the opportunity to ask questions.  All questions answered to their satisfaction.  The patient is in agreement to this plan.   Final Clinical Impressions(s) / UC Diagnoses   Final diagnoses:  Viral URI with cough     Discharge Instructions      You have a viral upper respiratory infection.  Symptoms should improve over the next week to 10 days.  If you develop chest pain or shortness of breath, go to the emergency room.  COVID-19 and influenza tests are negative today.  Some things that can make you feel better are: - Increased rest - Increasing fluid with water/sugar free electrolytes - Acetaminophen and ibuprofen as needed for fever/pain - Salt water  gargling, chloraseptic spray and throat lozenges - OTC guaifenesin (Mucinex) 600 mg twice daily for congestion - Saline sinus flushes or a neti pot - Humidifying the air - Flonase nasal spray twice daily while sick     ED Prescriptions     Medication Sig Dispense Auth. Provider   fluticasone (FLONASE) 50 MCG/ACT nasal spray Place 1 spray into both nostrils in the morning and at bedtime. 9.9 mL Valentino Nose, NP      PDMP not reviewed this encounter.   Valentino Nose, NP 03/23/23 1721

## 2023-03-27 DIAGNOSIS — M62830 Muscle spasm of back: Secondary | ICD-10-CM | POA: Diagnosis not present

## 2023-03-27 DIAGNOSIS — Z79891 Long term (current) use of opiate analgesic: Secondary | ICD-10-CM | POA: Diagnosis not present

## 2023-03-27 DIAGNOSIS — G894 Chronic pain syndrome: Secondary | ICD-10-CM | POA: Diagnosis not present

## 2023-03-27 DIAGNOSIS — M5459 Other low back pain: Secondary | ICD-10-CM | POA: Diagnosis not present

## 2023-03-27 DIAGNOSIS — Z79899 Other long term (current) drug therapy: Secondary | ICD-10-CM | POA: Diagnosis not present

## 2023-03-28 ENCOUNTER — Ambulatory Visit (INDEPENDENT_AMBULATORY_CARE_PROVIDER_SITE_OTHER): Payer: 59 | Admitting: Family Medicine

## 2023-03-28 VITALS — BP 135/81 | Wt 199.6 lb

## 2023-03-28 DIAGNOSIS — J441 Chronic obstructive pulmonary disease with (acute) exacerbation: Secondary | ICD-10-CM

## 2023-03-28 DIAGNOSIS — K219 Gastro-esophageal reflux disease without esophagitis: Secondary | ICD-10-CM

## 2023-03-28 MED ORDER — AZITHROMYCIN 250 MG PO TABS: ORAL_TABLET | ORAL | 0 refills | Status: AC

## 2023-03-28 MED ORDER — PANTOPRAZOLE SODIUM 40 MG PO TBEC: 40.0000 mg | DELAYED_RELEASE_TABLET | Freq: Two times a day (BID) | ORAL | 3 refills | Status: DC

## 2023-03-28 NOTE — Patient Instructions (Signed)
 Antibiotic as prescribed.  I change the reflux medicine.  Call with concerns.  Follow up in 6 months.

## 2023-03-29 ENCOUNTER — Ambulatory Visit: Payer: 59 | Admitting: Family Medicine

## 2023-03-29 DIAGNOSIS — J441 Chronic obstructive pulmonary disease with (acute) exacerbation: Secondary | ICD-10-CM | POA: Insufficient documentation

## 2023-03-29 NOTE — Assessment & Plan Note (Signed)
 Patient will continue use of his inhalers.  Treating with azithromycin.

## 2023-03-29 NOTE — Assessment & Plan Note (Signed)
 Patient reported to me that he does not feel like Dexilant is working well.  Dexilant discontinued.  Starting Protonix.

## 2023-03-29 NOTE — Progress Notes (Addendum)
 Subjective:  Patient ID: Adrian Bird, male    DOB: [birth year]  Age: 68 y.o. MRN: [id number]  CC:   Chief Complaint  Patient presents with   Hypertension    Follow up Seen in urgent care dx with URI and allergies and still feeling bad    HPI:  68 year old male presents for evaluation of the above.  Patient initially scheduled for follow-up but he is currently sick and would like this addressed today.  He states that he has been sick for approximately 1 week.  Was seen at urgent care on 2/13.  Was told that this was likely viral in origin.  Had negative flu and COVID testing as well as strep testing.  Was treated with supportive care and Flonase.  Patient states that he feels very poorly and is not improving.  He states that he is having congestion, cough, and his ears are stopped up.  Reporting extreme fatigue.  Has little energy to do anything.  Has been using Flonase in addition to Mucinex without improvement.  Patient Active Problem List   Diagnosis Date Noted   COPD exacerbation (HCC) 03/29/2023   Insomnia 09/26/2022   Gastroesophageal reflux disease 11/03/2021   History of epilepsy 07/26/2021   OSA on CPAP 12/15/2020   Tobacco abuse 12/15/2020   COPD with chronic bronchitis and emphysema (HCC) 12/15/2020   Essential tremor 10/15/2020   Anxiety disorder 09/16/2019   Chronic low back pain 09/16/2019   Long-term current use of opiate analgesic 09/16/2019   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/16/2019   Essential hypertension 08/20/2014   Arthritis 10/04/2012   Mixed hyperlipidemia 10/04/2012    Social Hx   Social History   Socioeconomic History   Marital status: Married    Spouse name: Not on file   Number of children: Not on file   Years of education: Not on file   Highest education level: Not on file  Occupational History   Not on file  Tobacco Use   Smoking status: Every Day    Current packs/day: 0.25    Average packs/day: 0.3 packs/day for  50.0 years (12.5 ttl pk-yrs)    Types: E-cigarettes, Cigarettes   Smokeless tobacco: Never  Vaping Use   Vaping status: Some Days  Substance and Sexual Activity   Alcohol use: No   Drug use: No   Sexual activity: Yes  Other Topics Concern   Not on file  Social History Narrative   Not on file   Social Drivers of Health   Financial Resource Strain: Low Risk  (04/22/2022)   Overall Financial Resource Strain (CARDIA)    Difficulty of Paying Living Expenses: Not hard at all  Food Insecurity: No Food Insecurity (04/22/2022)   Hunger Vital Sign    Worried About Running Out of Food in the Last Year: Never true    Ran Out of Food in the Last Year: Never true  Transportation Needs: No Transportation Needs (04/22/2022)   PRAPARE - Administrator, Civil Service (Medical): No    Lack of Transportation (Non-Medical): No  Physical Activity: Inactive (04/22/2022)   Exercise Vital Sign    Days of Exercise per Week: 0 days    Minutes of Exercise per Session: 0 min  Stress: No Stress Concern Present (04/22/2022)   [employer] of Occupational Health - Occupational Stress Questionnaire    Feeling of Stress : Only a little  Social Connections: Unknown (04/22/2022)   Social Connection and Isolation Panel [NHANES]  Frequency of Communication with Friends and Family: Three times a week    Frequency of Social Gatherings with Friends and Family: Once a week    Attends Religious Services: Never    Database administrator or Organizations: No    Attends Engineer, structural: Never    Marital Status: Patient declined    Review of Systems Per HPI  Objective:  BP 135/81   Wt 199 lb 9.6 oz (90.5 kg)   BMI 30.35 kg/m      03/28/2023    3:38 PM 03/23/2023    3:12 PM 01/12/2023   11:15 AM  BP/Weight  Systolic BP 135 159 120  Diastolic BP 81 87 50  Wt. (Lbs) 199.6  201  BMI 30.35 kg/m2  30.56 kg/m2    Physical Exam Constitutional:      General: He is not in acute  distress.    Appearance: Normal appearance.  HENT:     Head: Normocephalic and atraumatic.     Nose: Congestion present.     Mouth/Throat:     Pharynx: Posterior oropharyngeal erythema present.  Cardiovascular:     Rate and Rhythm: Normal rate and regular rhythm.  Pulmonary:     Effort: Pulmonary effort is normal. No respiratory distress.  Neurological:     Mental Status: He is alert.  Psychiatric:        Mood and Affect: Mood normal.        Behavior: Behavior normal.     Lab Results  Component Value Date   WBC 5.7 09/26/2022   HGB 14.3 09/26/2022   HCT 42.5 09/26/2022   PLT 276 09/26/2022   GLUCOSE 88 09/26/2022   CHOL 126 09/26/2022   TRIG 75 09/26/2022   HDL 59 09/26/2022   LDLCALC 52 09/26/2022   ALT 27 09/26/2022   AST 21 09/26/2022   NA 130 (L) 09/26/2022   K 4.7 09/26/2022   CL 90 (L) 09/26/2022   CREATININE 0.62 (L) 09/26/2022   BUN 5 (L) 09/26/2022   CO2 24 09/26/2022   HGBA1C 5.9 (H) 07/26/2021     Assessment & Plan:  COPD exacerbation (HCC) Assessment & Plan: Patient will continue use of his inhalers.  Treating with azithromycin.  Orders: -     Azithromycin; Take 2 tablets on day 1, then 1 tablet daily on days 2 through 5  Dispense: 6 tablet; Refill: 0  Gastroesophageal reflux disease, unspecified whether esophagitis present Assessment & Plan: Patient reported to me that he does not feel like Dexilant is working well.  Dexilant discontinued.  Starting Protonix.  Orders: -     Pantoprazole Sodium; Take 1 tablet (40 mg total) by mouth 2 (two) times daily before a meal.  Dispense: 180 tablet; Refill: 3    Follow-up:  6 months  Lenord Fralix Adriana Simas DO [facility]

## 2023-04-06 ENCOUNTER — Other Ambulatory Visit: Payer: Self-pay | Admitting: Family Medicine

## 2023-04-06 DIAGNOSIS — G4733 Obstructive sleep apnea (adult) (pediatric): Secondary | ICD-10-CM | POA: Diagnosis not present

## 2023-04-17 DIAGNOSIS — G4733 Obstructive sleep apnea (adult) (pediatric): Secondary | ICD-10-CM | POA: Diagnosis not present

## 2023-04-23 ENCOUNTER — Other Ambulatory Visit: Payer: Self-pay | Admitting: Family Medicine

## 2023-04-28 ENCOUNTER — Ambulatory Visit: Payer: 59

## 2023-04-29 ENCOUNTER — Other Ambulatory Visit: Payer: Self-pay | Admitting: Nurse Practitioner

## 2023-05-02 DIAGNOSIS — G4733 Obstructive sleep apnea (adult) (pediatric): Secondary | ICD-10-CM | POA: Diagnosis not present

## 2023-05-04 ENCOUNTER — Other Ambulatory Visit: Payer: Self-pay | Admitting: Family Medicine

## 2023-05-04 DIAGNOSIS — G8929 Other chronic pain: Secondary | ICD-10-CM

## 2023-05-14 ENCOUNTER — Other Ambulatory Visit: Payer: Self-pay | Admitting: Family Medicine

## 2023-05-15 ENCOUNTER — Other Ambulatory Visit: Payer: Self-pay

## 2023-05-15 ENCOUNTER — Other Ambulatory Visit: Payer: Self-pay | Admitting: Family Medicine

## 2023-05-15 MED ORDER — LINACLOTIDE 290 MCG PO CAPS: 290.0000 ug | ORAL_CAPSULE | Freq: Every morning | ORAL | 1 refills | Status: DC

## 2023-05-16 ENCOUNTER — Other Ambulatory Visit: Payer: Self-pay | Admitting: Family Medicine

## 2023-05-16 MED ORDER — BUSPIRONE HCL 5 MG PO TABS: 5.0000 mg | ORAL_TABLET | Freq: Two times a day (BID) | ORAL | 1 refills | Status: DC | PRN

## 2023-05-16 MED ORDER — SUCRALFATE 1 G PO TABS: 1.0000 g | ORAL_TABLET | Freq: Three times a day (TID) | ORAL | 0 refills | Status: DC

## 2023-05-16 MED ORDER — LEVETIRACETAM 750 MG PO TABS: ORAL_TABLET | ORAL | 3 refills | Status: DC

## 2023-05-16 NOTE — Telephone Encounter (Signed)
 Pharmacy is requesting clarification for directions for the following medications:  Copied from CRM [id number]. Topic: Clinical - Prescription Issue >> May 16, 2023 11:06 AM Harrison Mons wrote: [facility]- [facility] [phone]  busPIRone (BUSPAR) 5 MG tablet levETIRAcetam (KEPPRA) 750 MG tablet sucralfate (CARAFATE) 1 g tablet What are the directions for the prescriptions

## 2023-05-18 ENCOUNTER — Other Ambulatory Visit: Payer: Self-pay | Admitting: Family Medicine

## 2023-05-18 DIAGNOSIS — G4733 Obstructive sleep apnea (adult) (pediatric): Secondary | ICD-10-CM | POA: Diagnosis not present

## 2023-05-18 DIAGNOSIS — J4489 Other specified chronic obstructive pulmonary disease: Secondary | ICD-10-CM

## 2023-05-18 MED ORDER — ACCU-CHEK GUIDE TEST VI STRP: ORAL_STRIP | 0 refills | Status: DC

## 2023-05-22 DIAGNOSIS — Z79899 Other long term (current) drug therapy: Secondary | ICD-10-CM | POA: Diagnosis not present

## 2023-05-22 DIAGNOSIS — M62830 Muscle spasm of back: Secondary | ICD-10-CM | POA: Diagnosis not present

## 2023-05-22 DIAGNOSIS — M5459 Other low back pain: Secondary | ICD-10-CM | POA: Diagnosis not present

## 2023-05-22 DIAGNOSIS — G894 Chronic pain syndrome: Secondary | ICD-10-CM | POA: Diagnosis not present

## 2023-05-22 DIAGNOSIS — Z79891 Long term (current) use of opiate analgesic: Secondary | ICD-10-CM | POA: Diagnosis not present

## 2023-05-31 ENCOUNTER — Other Ambulatory Visit: Payer: Self-pay | Admitting: Family Medicine

## 2023-05-31 DIAGNOSIS — K219 Gastro-esophageal reflux disease without esophagitis: Secondary | ICD-10-CM

## 2023-05-31 DIAGNOSIS — G8929 Other chronic pain: Secondary | ICD-10-CM

## 2023-05-31 MED ORDER — CARBAMAZEPINE 200 MG PO TABS: 200.0000 mg | ORAL_TABLET | Freq: Three times a day (TID) | ORAL | 3 refills | Status: AC

## 2023-05-31 MED ORDER — FLUOXETINE HCL 10 MG PO CAPS: 10.0000 mg | ORAL_CAPSULE | Freq: Every day | ORAL | 3 refills | Status: DC

## 2023-05-31 MED ORDER — EZETIMIBE 10 MG PO TABS: ORAL_TABLET | ORAL | 3 refills | Status: AC

## 2023-05-31 MED ORDER — LISINOPRIL 20 MG PO TABS: 20.0000 mg | ORAL_TABLET | Freq: Every morning | ORAL | 3 refills | Status: AC

## 2023-05-31 MED ORDER — LEVETIRACETAM 750 MG PO TABS
ORAL_TABLET | ORAL | 3 refills | Status: AC
Start: 2023-05-31 — End: ?

## 2023-05-31 MED ORDER — BUSPIRONE HCL 5 MG PO TABS: 5.0000 mg | ORAL_TABLET | Freq: Two times a day (BID) | ORAL | 1 refills | Status: DC | PRN

## 2023-05-31 MED ORDER — DILTIAZEM HCL ER COATED BEADS 120 MG PO CP24: 120.0000 mg | ORAL_CAPSULE | Freq: Every day | ORAL | 3 refills | Status: AC

## 2023-05-31 MED ORDER — ATORVASTATIN CALCIUM 80 MG PO TABS
ORAL_TABLET | ORAL | 3 refills | Status: AC
Start: 2023-05-31 — End: ?

## 2023-05-31 MED ORDER — FAMOTIDINE 20 MG PO TABS: ORAL_TABLET | ORAL | 3 refills | Status: DC

## 2023-05-31 MED ORDER — PANTOPRAZOLE SODIUM 40 MG PO TBEC: 40.0000 mg | DELAYED_RELEASE_TABLET | Freq: Two times a day (BID) | ORAL | 3 refills | Status: DC

## 2023-05-31 NOTE — Telephone Encounter (Signed)
 Copied from CRM [id number]. Topic: Clinical - Medication Refill >> May 31, 2023  8:30 AM Marissa P wrote: Most Recent Primary Care Visit:  Provider: Myrna Ast  Department: [facility]  Visit Type: OFFICE VISIT  Date: 03/28/2023  Medication: albuterol  (VENTOLIN  HFA) 108 (90 Base) MCG/ACT inhaler And anoro ellipta  52.5 mcg/ 25 mcg inhaler   Has the patient contacted their pharmacy? Yes (Agent: If no, request that the patient contact the pharmacy for the refill. If patient does not wish to contact the pharmacy document the reason why and proceed with request.) (Agent: If yes, when and what did the pharmacy advise?)  Is this the correct pharmacy for this prescription? Yes If no, delete pharmacy and type the correct one.  This is the patient's preferred pharmacy:  [facility] - [place], [place] - [address] [place] [address] Phone: [phone] Fax: [phone]   Has the prescription been filled recently? Yes  Is the patient out of the medication? Yes  Has the patient been seen for an appointment in the last year OR does the patient have an upcoming appointment? Yes  Can we respond through MyChart? No  Agent: Please be advised that Rx refills may take up to 3 business days. We ask that you follow-up with your pharmacy.

## 2023-05-31 NOTE — Telephone Encounter (Signed)
 Copied from CRM [id number]. Topic: Clinical - Medication Refill >> May 31, 2023  8:13 AM Adan Adas P wrote: Most Recent Primary Care Visit:  Provider: Myrna Ast  Department: [facility]  Visit Type: OFFICE VISIT  Date: 03/28/2023  Medication: busPIRone  (BUSPAR ) 5 MG tablet, carbamazepine  (TEGRETOL ) 200 MG tablet, and atorvastatin  (LIPITOR). lisinopril  (ZESTRIL ) 20 MG tablet, levETIRAcetam  (KEPPRA ) 750 MG tablet and gabapentin  (NEURONTIN ) 300 MG capsule, FLUoxetine  (PROZAC ) 10 MG capsule, famotidine  (PEPCID ) 20 MG tablet, ezetimibe  (ZETIA ) 10 MG tablet, diltiazem  (CARDIZEM  CD) 120 MG 24 hr capsule, pantoprazole  (PROTONIX ) 40 MG tablet   Has the patient contacted their pharmacy? Yes (Agent: If no, request that the patient contact the pharmacy for the refill. If patient does not wish to contact the pharmacy document the reason why and proceed with request.) (Agent: If yes, when and what did the pharmacy advise?)  Is this the correct pharmacy for this prescription? Yes If no, delete pharmacy and type the correct one.  This is the patient's preferred pharmacy:  [facility] - [place], [place] - [address] [place] [address] Phone: [phone] Fax: [phone]   Has the prescription been filled recently? No  Is the patient out of the medication? No, out on Monday   Has the patient been seen for an appointment in the last year OR does the patient have an upcoming appointment? Yes  Can we respond through MyChart? No  Agent: Please be advised that Rx refills may take up to 3 business days. We ask that you follow-up with your pharmacy.

## 2023-06-01 ENCOUNTER — Other Ambulatory Visit: Payer: Self-pay | Admitting: Family Medicine

## 2023-06-01 ENCOUNTER — Other Ambulatory Visit: Payer: Self-pay

## 2023-06-01 MED ORDER — MONTELUKAST SODIUM 10 MG PO TABS: 10.0000 mg | ORAL_TABLET | Freq: Every day | ORAL | 3 refills | Status: DC

## 2023-06-01 MED ORDER — TAMSULOSIN HCL 0.4 MG PO CAPS: 0.4000 mg | ORAL_CAPSULE | Freq: Every day | ORAL | 3 refills | Status: AC

## 2023-06-17 DIAGNOSIS — G4733 Obstructive sleep apnea (adult) (pediatric): Secondary | ICD-10-CM | POA: Diagnosis not present

## 2023-06-23 ENCOUNTER — Ambulatory Visit

## 2023-06-23 VITALS — Ht 68.0 in | Wt 199.0 lb

## 2023-06-23 DIAGNOSIS — Z0000 Encounter for general adult medical examination without abnormal findings: Secondary | ICD-10-CM | POA: Diagnosis not present

## 2023-06-23 NOTE — Patient Instructions (Signed)
 [name] , Thank you for taking time out of your busy schedule to complete your Annual Wellness Visit with me. I enjoyed our conversation and look forward to speaking with you again next year. I, as well as your care team,  appreciate your ongoing commitment to your health goals. Please review the following plan we discussed and let me know if I can assist you in the future. Your Game plan/ To Do List    Follow up Visits: Next Medicare AWV with our clinical staff: In 1 year    Have you seen your provider in the last 6 months (3 months if uncontrolled diabetes)? Yes Next Office Visit with your provider: Scheduled for 09/25/23 @ 2:50  Clinician Recommendations:  Aim for 30 minutes of exercise or brisk walking, 6-8 glasses of water, and 5 servings of fruits and vegetables each day.       This is a list of the screening recommended for you and due dates:  Health Maintenance  Topic Date Due   Zoster (Shingles) Vaccine (1 of 2) 05/01/1975   Pneumonia Vaccine (2 of 2 - PCV) 02/13/2021   Colon Cancer Screening  09/26/2023*   Flu Shot  09/08/2023   Medicare Annual Wellness Visit  06/22/2024   DTaP/Tdap/Td vaccine (2 - Td or Tdap) 02/13/2030   HPV Vaccine  Aged Out   Meningitis B Vaccine  Aged Out   COVID-19 Vaccine  Discontinued   Hepatitis C Screening  Discontinued  *Topic was postponed. The date shown is not the original due date.    Advanced directives: (ACP Link)Information on Advanced Care Planning can be found at [place]  Secretary of [facility] Advance Health Care Directives Advance Health Care Directives. [email or web address]   Advance Care Planning is important because it:  [x]  Makes sure you receive the medical care that is consistent with your values, goals, and preferences  [x]  It provides guidance to your family and loved ones and reduces their decisional burden about whether or not they are making the right decisions based on your wishes.  Follow the link provided in your after visit  summary or read over the paperwork we have mailed to you to help you started getting your Advance Directives in place. If you need assistance in completing these, please reach out to us  so that we can help you!  See attachments for Preventive Care and Fall Prevention Tips.

## 2023-06-23 NOTE — Progress Notes (Signed)
 Subjective:   Adrian Bird is a 68 y.o. who presents for a Medicare Wellness preventive visit.  As a reminder, Annual Wellness Visits don't include a physical exam, and some assessments may be limited, especially if this visit is performed virtually. We may recommend an in-person follow-up visit with your provider if needed.  Visit Complete: Virtual I connected with  Adrian Bird on 06/23/23 by a audio enabled telemedicine application and verified that I am speaking with the correct person using two identifiers.  Patient Location: Home  Provider Location: Home Office  I discussed the limitations of evaluation and management by telemedicine. The patient expressed understanding and agreed to proceed.  Vital Signs: Because this visit was a virtual/telehealth visit, some criteria may be missing or patient reported. Any vitals not documented were not able to be obtained and vitals that have been documented are patient reported.  VideoDeclined- This patient declined Librarian, academic. Therefore the visit was completed with audio only.  Persons Participating in Visit: Patient assisted by daughter.  AWV Questionnaire: No: Patient Medicare AWV questionnaire was not completed prior to this visit.  Cardiac Risk Factors include: advanced age (>20men, >1 women);dyslipidemia;hypertension;male gender;smoking/ tobacco exposure     Objective:     Today's Vitals   06/23/23 1529  Weight: 199 lb (90.3 kg)  Height: 5\' 8"  (1.727 m)   Body mass index is 30.26 kg/m.     06/23/2023    3:33 PM 04/22/2022    4:01 PM 01/25/2022    8:35 AM  Advanced Directives  Does Patient Have a Medical Advance Directive? No No No  Would patient like information on creating a medical advance directive? Yes (MAU/Ambulatory/Procedural Areas - Information given) No - Patient declined No - Patient declined    Current Medications (verified) Outpatient Encounter  Medications as of 06/23/2023  Medication Sig   albuterol  (VENTOLIN  HFA) 108 (90 Base) MCG/ACT inhaler INHALE 2 PUFFS BY MOUTH EVERY 6 HOURS   atorvastatin  (LIPITOR) 80 MG tablet TAKE 1 TABLET BY MOUTH AT BEDTIME.   busPIRone  (BUSPAR ) 5 MG tablet Take 1 tablet (5 mg total) by mouth 2 (two) times daily as needed.   carbamazepine  (TEGRETOL ) 200 MG tablet Take 1 tablet (200 mg total) by mouth 3 (three) times daily.   diltiazem  (CARDIZEM  CD) 120 MG 24 hr capsule Take 1 capsule (120 mg total) by mouth daily.   ezetimibe  (ZETIA ) 10 MG tablet TAKE 1 TABLET BY MOUTH AT BEDTIME.   famotidine  (PEPCID ) 20 MG tablet TAKE (1) TABLET TWICE DAILY.   FLUoxetine  (PROZAC ) 10 MG capsule Take 1 capsule (10 mg total) by mouth daily.   fluticasone  (FLONASE ) 50 MCG/ACT nasal spray SHAKE LIQUID AND USE 1 SPRAY IN EACH NOSTRIL IN THE MORNING AND AT BEDTIME   gabapentin  (NEURONTIN ) 300 MG capsule take 1 capsule by mouth twice daily.   glucose blood (ACCU-CHEK GUIDE TEST) test strip use to check blood sugar 3 times daily.   levETIRAcetam  (KEPPRA ) 750 MG tablet Take (1) tablet twice daily.   linaclotide  (LINZESS ) 290 MCG CAPS capsule Take 1 capsule (290 mcg total) by mouth every morning.   lisinopril  (ZESTRIL ) 20 MG tablet Take 1 tablet (20 mg total) by mouth every morning.   montelukast  (SINGULAIR ) 10 MG tablet Take 1 tablet (10 mg total) by mouth at bedtime.   Multiple Vitamin (MULTIVITAMIN WITH MINERALS) TABS tablet Take 1 tablet by mouth daily.   Oxycodone HCl 10 MG TABS Take 10 mg by mouth in the  morning, at noon, in the evening, and at bedtime.   pantoprazole  (PROTONIX ) 40 MG tablet Take 1 tablet (40 mg total) by mouth 2 (two) times daily before a meal.   sucralfate  (CARAFATE ) 1 g tablet Take 1 tablet (1 g total) by mouth with breakfast, with lunch, and with evening meal.   tamsulosin  (FLOMAX ) 0.4 MG CAPS capsule Take 1 capsule (0.4 mg total) by mouth daily.   traZODone  (DESYREL ) 50 MG tablet take 1 tablet (50  MILLIGRAM total) by mouth at bedtime.   triamcinolone  cream (KENALOG ) 0.1 % Apply 1 Application topically 2 (two) times daily.   umeclidinium-vilanterol (ANORO ELLIPTA ) 62.5-25 MCG/ACT AEPB INHALE 1 PUFF BY MOUTH ONCE DAILY   No facility-administered encounter medications on file as of 06/23/2023.    Allergies (verified) Cephalexin, Doxycycline, Penicillins, and Sulfa antibiotics   History: Past Medical History:  Diagnosis Date   Acid reflux    Asthma    Depression    Epilepsy (HCC)    High blood pressure    Urticaria    Past Surgical History:  Procedure Laterality Date   BIOPSY  01/27/2022   Procedure: BIOPSY;  Surgeon: Umberto Ganong, Bearl Limes, MD;  Location: [facility];  Service: Gastroenterology;;   CYST REMOVAL NECK     CYST REMOVAL TRUNK     ESOPHAGOGASTRODUODENOSCOPY (EGD) WITH PROPOFOL  N/A 01/27/2022   Procedure: ESOPHAGOGASTRODUODENOSCOPY (EGD) WITH PROPOFOL ;  Surgeon: Urban Garden, MD;  Location: [facility];  Service: Gastroenterology;  Laterality: N/A;  1:30 pm, pt knows to arrive at 8:00   EYE SURGERY Bilateral    HERNIA REPAIR     KNEE SURGERY Left    TONSILLECTOMY     Family History  Problem Relation Age of Onset   Congestive Heart Failure Mother    Lung cancer Father    Diabetes Sister    Diabetes Brother    Diabetes Maternal Aunt    Lung cancer Maternal Grandfather    Social History   Socioeconomic History   Marital status: Married    Spouse name: Not on file   Number of children: Not on file   Years of education: Not on file   Highest education level: Not on file  Occupational History   Not on file  Tobacco Use   Smoking status: Every Day    Current packs/day: 0.25    Average packs/day: 0.3 packs/day for 50.0 years (12.5 ttl pk-yrs)    Types: E-cigarettes, Cigarettes   Smokeless tobacco: Never  Vaping Use   Vaping status: Some Days  Substance and Sexual Activity   Alcohol  use: No   Drug use: No   Sexual activity: Yes   Other Topics Concern   Not on file  Social History Narrative   Not on file   Social Drivers of Health   Financial Resource Strain: Low Risk  (06/23/2023)   Overall Financial Resource Strain (CARDIA)    Difficulty of Paying Living Expenses: Not hard at all  Food Insecurity: No Food Insecurity (06/23/2023)   Hunger Vital Sign    Worried About Running Out of Food in the Last Year: Never true    Ran Out of Food in the Last Year: Never true  Transportation Needs: No Transportation Needs (06/23/2023)   PRAPARE - Administrator, Civil Service (Medical): No    Lack of Transportation (Non-Medical): No  Physical Activity: Inactive (06/23/2023)   Exercise Vital Sign    Days of Exercise per Week: 0 days    Minutes  of Exercise per Session: 0 min  Stress: No Stress Concern Present (06/23/2023)   [employer] of Occupational Health - Occupational Stress Questionnaire    Feeling of Stress : Not at all  Social Connections: Socially Isolated (06/23/2023)   Social Connection and Isolation Panel [NHANES]    Frequency of Communication with Friends and Family: Three times a week    Frequency of Social Gatherings with Friends and Family: Once a week    Attends Religious Services: Never    Database administrator or Organizations: No    Attends Engineer, structural: Never    Marital Status: Never married    Tobacco Counseling Ready to quit: Not Answered Counseling given: Not Answered    Clinical Intake:  Pre-visit preparation completed: Yes  Pain : No/denies pain     Diabetes: No  Lab Results  Component Value Date   HGBA1C 5.9 (H) 07/26/2021     How often do you need to have someone help you when you read instructions, pamphlets, or other written materials from your doctor or pharmacy?: 1 - Never  Interpreter Needed?: No  Information entered by :: Seabron Cypress LPN   Activities of Daily Living     06/23/2023    3:29 PM  In your present state of health,  do you have any difficulty performing the following activities:  Hearing? 0  Vision? 0  Difficulty concentrating or making decisions? 0  Walking or climbing stairs? 1  Dressing or bathing? 1  Doing errands, shopping? 0  Preparing Food and eating ? N  Using the Toilet? N  In the past six months, have you accidently leaked urine? N  Do you have problems with loss of bowel control? N  Managing your Medications? Y  Managing your Finances? Y  Housekeeping or managing your Housekeeping? Y    Patient Care Team: Cook, Jayce G, DO as PCP - General (Family Medicine)  Indicate any recent Medical Services you may have received from other than [facility] providers in the past year (date may be approximate).     Assessment:    This is a routine wellness examination for [place].  Hearing/Vision screen Hearing Screening - Comments:: Denies hearing difficulties   Vision Screening - Comments:: No vision problems; will schedule routine eye exam soon     Goals Addressed             This Visit's Progress    Manage pain   On track      Depression Screen     06/23/2023    3:34 PM 03/28/2023    3:40 PM 09/26/2022   10:57 AM 04/22/2022    3:59 PM 03/28/2022   10:58 AM 07/26/2021   10:27 AM  PHQ 2/9 Scores  PHQ - 2 Score 4 4 3 2 2  0  PHQ- 9 Score 12 12 13 5 5      Fall Risk     06/23/2023    3:32 PM 11/02/2022   10:22 AM 09/26/2022   10:57 AM 04/22/2022    3:58 PM 03/28/2022   10:58 AM  Fall Risk   Falls in the past year? 0 0 1 0 0  Number falls in past yr: 0 0 1 0 0  Injury with Fall? 0 0 0 0 0  Risk for fall due to : Impaired balance/gait;Impaired mobility   Impaired balance/gait;Impaired mobility No Fall Risks  Follow up Falls prevention discussed;Education provided;Falls evaluation completed   Falls prevention discussed;Education provided;Falls evaluation completed Falls  evaluation completed    MEDICARE RISK AT HOME:  Medicare Risk at Home Any stairs in or around the home?: No If  so, are there any without handrails?: No Home free of loose throw rugs in walkways, pet beds, electrical cords, etc?: Yes Adequate lighting in your home to reduce risk of falls?: Yes Life alert?: No Use of a cane, walker or w/c?: Yes Grab bars in the bathroom?: Yes Shower chair or bench in shower?: No Elevated toilet seat or a handicapped toilet?: Yes  TIMED UP AND GO:  Was the test performed?  No  Cognitive Function: Declined/Normal: No cognitive concerns noted by patient or family. Patient alert, oriented, able to answer questions appropriately and recall recent events. No signs of memory loss or confusion.        04/22/2022    4:01 PM  6CIT Screen  What Year? 0 points  What month? 0 points  What time? 0 points  Count back from 20 0 points  Months in reverse 0 points  Repeat phrase 0 points  Total Score 0 points    Immunizations Immunization History  Administered Date(s) Administered   Influenza,inj,Quad PF,6+ Mos 11/05/2019, 11/05/2019, 10/10/2020   PFIZER(Purple Top)SARS-COV-2 Vaccination 04/24/2019, 05/20/2019, 12/26/2019   Pneumococcal Polysaccharide-23 02/14/2020   Tdap 02/14/2020   Zoster, Live 09/10/2020    Screening Tests Health Maintenance  Topic Date Due   Zoster Vaccines- Shingrix (1 of 2) 05/01/1975   Pneumonia Vaccine 45+ Years old (2 of 2 - PCV) 02/13/2021   Colonoscopy  09/26/2023 (Originally 04/30/2001)   INFLUENZA VACCINE  09/08/2023   Medicare Annual Wellness (AWV)  06/22/2024   DTaP/Tdap/Td (2 - Td or Tdap) 02/13/2030   HPV VACCINES  Aged Out   Meningococcal B Vaccine  Aged Out   COVID-19 Vaccine  Discontinued   Hepatitis C Screening  Discontinued    Health Maintenance  Health Maintenance Due  Topic Date Due   Zoster Vaccines- Shingrix (1 of 2) 05/01/1975   Pneumonia Vaccine 71+ Years old (2 of 2 - PCV) 02/13/2021   Health Maintenance Items Addressed: Declines vaccines and colonoscopy at this time   Additional Screening:  Vision  Screening: Recommended annual ophthalmology exams for early detection of glaucoma and other disorders of the eye.  Dental Screening: Recommended annual dental exams for proper oral hygiene  Community Resource Referral / Chronic Care Management: CRR required this visit?  No   CCM required this visit?  No   Plan:    I have personally reviewed and noted the following in the patient's chart:   Medical and social history Use of alcohol , tobacco or illicit drugs  Current medications and supplements including opioid prescriptions. Patient is not currently taking opioid prescriptions. Functional ability and status Nutritional status Physical activity Advanced directives List of other physicians Hospitalizations, surgeries, and ER visits in previous 12 months Vitals Screenings to include cognitive, depression, and falls Referrals and appointments  In addition, I have reviewed and discussed with patient certain preventive protocols, quality metrics, and best practice recommendations. A written personalized care plan for preventive services as well as general preventive health recommendations were provided to patient.   Seabron Cypress [place], [place]   1/61/0960   After Visit Summary: (MyChart) Due to this being a telephonic visit, the after visit summary with patients personalized plan was offered to patient via MyChart   Notes: Please refer to Routing Comments.

## 2023-06-26 ENCOUNTER — Ambulatory Visit
Admission: EM | Admit: 2023-06-26 | Discharge: 2023-06-26 | Disposition: A | Discharge status: Home / Self Care | Attending: Nurse Practitioner | Admitting: Nurse Practitioner

## 2023-06-26 DIAGNOSIS — J441 Chronic obstructive pulmonary disease with (acute) exacerbation: Secondary | ICD-10-CM | POA: Diagnosis not present

## 2023-06-26 DIAGNOSIS — J4489 Other specified chronic obstructive pulmonary disease: Secondary | ICD-10-CM

## 2023-06-26 MED ORDER — UMECLIDINIUM-VILANTEROL 62.5-25 MCG/ACT IN AEPB
INHALATION_SPRAY | RESPIRATORY_TRACT | 11 refills | Status: DC
Start: 2023-06-26 — End: 2023-06-28

## 2023-06-26 MED ORDER — BENZONATATE 100 MG PO CAPS: 100.0000 mg | ORAL_CAPSULE | Freq: Three times a day (TID) | ORAL | 0 refills | Status: DC | PRN

## 2023-06-26 MED ORDER — AZITHROMYCIN 250 MG PO TABS: ORAL_TABLET | ORAL | 0 refills | Status: DC

## 2023-06-26 MED ORDER — PREDNISONE 20 MG PO TABS: 20.0000 mg | ORAL_TABLET | Freq: Every day | ORAL | 0 refills | Status: AC

## 2023-06-26 NOTE — ED Triage Notes (Signed)
 Pt states he is having weakness, nausea, ear pain, body aches, cough x 3 weeks. Taking tylenol.

## 2023-06-26 NOTE — Discharge Instructions (Signed)
 I suspect you have an exacerbation of your breathing likely due to a viral illness.  Continue the Anoro daily-I have sent this prescription to the pharmacy.  Also recommend using albuterol  inhaler scheduled every 6 hours for the next couple of days, then use as needed to help keep your airway open.  In addition, recommend azithromycin  to treat airway inflammation and infection and oral prednisone .  Start guaifenesin 600 mg twice daily and Tessalon  Perles every 8 hours as needed for cough.  Seek care if symptoms worsen despite treatment.

## 2023-06-26 NOTE — ED Provider Notes (Signed)
 [facility]    CSN: [id number] Arrival date & time: 06/26/23  1603      History   Chief Complaint Chief Complaint  Patient presents with   Cough   Weakness    HPI Adrian Bird is a 68 y.o. male.   Patient presents today with 3-week history of bodyaches, congested cough, shortness of breath, chest pain when he coughs, runny and stuffy nose, sore throat, bilateral ear pain, decreased appetite, and fatigue.  No fever or dry cough, headache, abdominal pain, nausea/vomiting, or diarrhea.  Thinks he caught a virus from his grandson.  Has been taking albuterol  inhaler with minimal temporary improvement.  Reports he is almost out of Anoro and is requesting a refill of the prescription today.    Past Medical History:  Diagnosis Date   Acid reflux    Asthma    Depression    Epilepsy (HCC)    High blood pressure    Urticaria     Patient Active Problem List   Diagnosis Date Noted   COPD exacerbation (HCC) 03/29/2023   Insomnia 09/26/2022   Gastroesophageal reflux disease 11/03/2021   History of epilepsy 07/26/2021   OSA on CPAP 12/15/2020   Tobacco abuse 12/15/2020   COPD with chronic bronchitis and emphysema (HCC) 12/15/2020   Essential tremor 10/15/2020   Anxiety disorder 09/16/2019   Chronic low back pain 09/16/2019   Long-term current use of opiate analgesic 09/16/2019   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/16/2019   Essential hypertension 08/20/2014   Arthritis 10/04/2012   Mixed hyperlipidemia 10/04/2012    Past Surgical History:  Procedure Laterality Date   BIOPSY  01/27/2022   Procedure: BIOPSY;  Surgeon: Urban Garden, MD;  Location: [facility];  Service: Gastroenterology;;   CYST REMOVAL NECK     CYST REMOVAL TRUNK     ESOPHAGOGASTRODUODENOSCOPY (EGD) WITH PROPOFOL  N/A 01/27/2022   Procedure: ESOPHAGOGASTRODUODENOSCOPY (EGD) WITH PROPOFOL ;  Surgeon: Urban Garden, MD;  Location: [facility];  Service:  Gastroenterology;  Laterality: N/A;  1:30 pm, pt knows to arrive at 8:00   EYE SURGERY Bilateral    HERNIA REPAIR     KNEE SURGERY Left    TONSILLECTOMY         Home Medications    Prior to Admission medications   Medication Sig Start Date End Date Taking? Authorizing Provider  albuterol  (VENTOLIN  HFA) 108 (90 Base) MCG/ACT inhaler INHALE 2 PUFFS BY MOUTH EVERY 6 HOURS 05/18/23  Yes Cook, Jayce G, DO  atorvastatin  (LIPITOR) 80 MG tablet TAKE 1 TABLET BY MOUTH AT BEDTIME. 05/31/23  Yes Cook, Jayce G, DO  azithromycin  (ZITHROMAX ) 250 MG tablet Take (2) tablets by mouth on day 1, then take (1) tablet by mouth on days 2-5. 06/26/23  Yes Wilhemena Harbour, NP  benzonatate  (TESSALON ) 100 MG capsule Take 1 capsule (100 mg total) by mouth 3 (three) times daily as needed for cough. Do not take with alcohol  or while operating or driving heavy machinery 2/95/62  Yes Thena Fireman A, NP  busPIRone  (BUSPAR ) 5 MG tablet Take 1 tablet (5 mg total) by mouth 2 (two) times daily as needed. 05/31/23  Yes Cook, Jayce G, DO  carbamazepine  (TEGRETOL ) 200 MG tablet Take 1 tablet (200 mg total) by mouth 3 (three) times daily. 05/31/23  Yes Cook, Jayce G, DO  diltiazem  (CARDIZEM  CD) 120 MG 24 hr capsule Take 1 capsule (120 mg total) by mouth daily. 05/31/23  Yes Cook, Jayce G, DO  ezetimibe  (ZETIA ) 10 MG tablet TAKE 1 TABLET BY MOUTH AT BEDTIME. 05/31/23  Yes Cook, Jayce G, DO  famotidine  (PEPCID ) 20 MG tablet TAKE (1) TABLET TWICE DAILY. 05/31/23  Yes Cook, Jayce G, DO  FLUoxetine  (PROZAC ) 10 MG capsule Take 1 capsule (10 mg total) by mouth daily. 05/31/23  Yes Cook, Jayce G, DO  fluticasone  (FLONASE ) 50 MCG/ACT nasal spray SHAKE LIQUID AND USE 1 SPRAY IN EACH NOSTRIL IN THE MORNING AND AT BEDTIME 05/01/23  Yes Cook, Jayce G, DO  levETIRAcetam  (KEPPRA ) 750 MG tablet Take (1) tablet twice daily. 05/31/23  Yes Cook, Jayce G, DO  linaclotide  (LINZESS ) 290 MCG CAPS capsule Take 1 capsule (290 mcg total) by mouth every  morning. 05/15/23  Yes Cook, Jayce G, DO  lisinopril  (ZESTRIL ) 20 MG tablet Take 1 tablet (20 mg total) by mouth every morning. 05/31/23  Yes Cook, Jayce G, DO  montelukast  (SINGULAIR ) 10 MG tablet Take 1 tablet (10 mg total) by mouth at bedtime. 06/01/23  Yes Cook, Jayce G, DO  Multiple Vitamin (MULTIVITAMIN WITH MINERALS) TABS tablet Take 1 tablet by mouth daily.   Yes [provider]  Oxycodone HCl 10 MG TABS Take 10 mg by mouth in the morning, at noon, in the evening, and at bedtime.   Yes [provider]  pantoprazole  (PROTONIX ) 40 MG tablet Take 1 tablet (40 mg total) by mouth 2 (two) times daily before a meal. 05/31/23  Yes Cook, Jayce G, DO  predniSONE  (DELTASONE ) 20 MG tablet Take 1 tablet (20 mg total) by mouth daily with breakfast for 5 days. 06/26/23 07/01/23 Yes Wilhemena Harbour, NP  sucralfate  (CARAFATE ) 1 g tablet Take 1 tablet (1 g total) by mouth with breakfast, with lunch, and with evening meal. 05/16/23  Yes Cook, Jayce G, DO  tamsulosin  (FLOMAX ) 0.4 MG CAPS capsule Take 1 capsule (0.4 mg total) by mouth daily. 06/01/23  Yes Cook, Jayce G, DO  traZODone  (DESYREL ) 50 MG tablet take 1 tablet (50 MILLIGRAM total) by mouth at bedtime. 06/01/23  Yes Cook, Jayce G, DO  gabapentin  (NEURONTIN ) 300 MG capsule take 1 capsule by mouth twice daily. 06/01/23   Cook, Jayce G, DO  glucose blood (ACCU-CHEK GUIDE TEST) test strip use to check blood sugar 3 times daily. 06/01/23   Cook, Jayce G, DO  triamcinolone  cream (KENALOG ) 0.1 % Apply 1 Application topically 2 (two) times daily. 10/04/22   Leath-Warren, Belen Bowers, NP  umeclidinium-vilanterol (ANORO ELLIPTA ) 62.5-25 MCG/ACT AEPB INHALE 1 PUFF BY MOUTH ONCE DAILY 06/26/23   Wilhemena Harbour, NP    Family History Family History  Problem Relation Age of Onset   Congestive Heart Failure Mother    Lung cancer Father    Diabetes Sister    Diabetes Brother    Diabetes Maternal Aunt    Lung cancer Maternal Grandfather     Social  History Social History   Tobacco Use   Smoking status: Every Day    Current packs/day: 0.25    Average packs/day: 0.3 packs/day for 50.0 years (12.5 ttl pk-yrs)    Types: E-cigarettes, Cigarettes   Smokeless tobacco: Never  Vaping Use   Vaping status: Some Days  Substance Use Topics   Alcohol  use: No   Drug use: No     Allergies   Cephalexin, Doxycycline, Penicillins, and Sulfa antibiotics   Review of Systems Review of Systems Per HPI  Physical Exam Triage Vital Signs ED Triage Vitals  Encounter Vitals Group  BP 06/26/23 1653 (!) 172/74     Systolic BP Percentile --      Diastolic BP Percentile --      Pulse Rate 06/26/23 1653 68     Resp 06/26/23 1653 20     Temp 06/26/23 1653 98.3 F (36.8 C)     Temp Source 06/26/23 1653 Oral     SpO2 06/26/23 1656 94 %     Weight --      Height --      Head Circumference --      Peak Flow --      Pain Score 06/26/23 1653 10     Pain Loc --      Pain Education --      Exclude from Growth Chart --    No data found.  Updated Vital Signs BP (!) 172/74 (BP Location: Right Arm)   Pulse 68   Temp 98.3 F (36.8 C) (Oral)   Resp 20   SpO2 94%   Visual Acuity Right Eye Distance:   Left Eye Distance:   Bilateral Distance:    Right Eye Near:   Left Eye Near:    Bilateral Near:     Physical Exam Vitals and nursing note reviewed.  Constitutional:      General: He is not in acute distress.    Appearance: Normal appearance. He is not ill-appearing or toxic-appearing.  HENT:     Head: Normocephalic and atraumatic.     Right Ear: Tympanic membrane, ear canal and external ear normal.     Left Ear: Tympanic membrane, ear canal and external ear normal.     Nose: No congestion or rhinorrhea.     Mouth/Throat:     Mouth: Mucous membranes are moist.     Pharynx: Oropharynx is clear. Posterior oropharyngeal erythema present. No oropharyngeal exudate.  Eyes:     General: No scleral icterus.    Extraocular Movements:  Extraocular movements intact.  Cardiovascular:     Rate and Rhythm: Normal rate and regular rhythm.  Pulmonary:     Effort: Pulmonary effort is normal. No respiratory distress.     Breath sounds: Normal breath sounds. No wheezing, rhonchi or rales.  Musculoskeletal:     Cervical back: Normal range of motion and neck supple.  Lymphadenopathy:     Cervical: No cervical adenopathy.  Skin:    General: Skin is warm and dry.     Coloration: Skin is not jaundiced or pale.     Findings: No erythema or rash.  Neurological:     Mental Status: He is alert and oriented to person, place, and time.  Psychiatric:        Behavior: Behavior is cooperative.      UC Treatments / Results  Labs (all labs ordered are listed, but only abnormal results are displayed) Labs Reviewed - No data to display  EKG   Radiology No results found.  Procedures Procedures (including critical care time)  Medications Ordered in UC Medications - No data to display  Initial Impression / Assessment and Plan / UC Course  I have reviewed the triage vital signs and the nursing notes.  Pertinent labs & imaging results that were available during my care of the patient were reviewed by me and considered in my medical decision making (see chart for details).   Patient is mildly hypertensive in triage today, otherwise vital signs are stable.  1. COPD exacerbation (HCC) Vitals are stable in triage, patient is well-appearing Will treat with  azithromycin  500 mg today, 250 mg for the next 4 days, start oral prednisone  tomorrow; dose decreased due to Tegretol  Continue Anoro and rescue inhaler-refill sent for Anoro Also recommended pulmonary toilet with guaifenesin 600 mg twice daily and drink plenty of fluids Return and ER precautions discussed with patient  The patient was given the opportunity to ask questions.  All questions answered to their satisfaction.  The patient is in agreement to this plan.   Final Clinical  Impressions(s) / UC Diagnoses   Final diagnoses:  COPD exacerbation (HCC)     Discharge Instructions      I suspect you have an exacerbation of your breathing likely due to a viral illness.  Continue the Anoro daily-I have sent this prescription to the pharmacy.  Also recommend using albuterol  inhaler scheduled every 6 hours for the next couple of days, then use as needed to help keep your airway open.  In addition, recommend azithromycin  to treat airway inflammation and infection and oral prednisone .  Start guaifenesin 600 mg twice daily and Tessalon  Perles every 8 hours as needed for cough.  Seek care if symptoms worsen despite treatment.   ED Prescriptions     Medication Sig Dispense Auth. Provider   azithromycin  (ZITHROMAX ) 250 MG tablet Take (2) tablets by mouth on day 1, then take (1) tablet by mouth on days 2-5. 6 tablet Thena Fireman A, NP   benzonatate  (TESSALON ) 100 MG capsule Take 1 capsule (100 mg total) by mouth 3 (three) times daily as needed for cough. Do not take with alcohol  or while operating or driving heavy machinery 21 capsule Thena Fireman A, NP   umeclidinium-vilanterol (ANORO ELLIPTA ) 62.5-25 MCG/ACT AEPB INHALE 1 PUFF BY MOUTH ONCE DAILY 60 each Thena Fireman A, NP   predniSONE  (DELTASONE ) 20 MG tablet Take 1 tablet (20 mg total) by mouth daily with breakfast for 5 days. 5 tablet Wilhemena Harbour, NP      PDMP not reviewed this encounter.   Wilhemena Harbour, NP 06/26/23 1718

## 2023-06-28 ENCOUNTER — Other Ambulatory Visit: Payer: Self-pay | Admitting: Family Medicine

## 2023-06-28 DIAGNOSIS — J4489 Other specified chronic obstructive pulmonary disease: Secondary | ICD-10-CM

## 2023-07-06 ENCOUNTER — Ambulatory Visit
Admission: EM | Admit: 2023-07-06 | Discharge: 2023-07-06 | Disposition: A | Discharge status: Home / Self Care | Attending: Nurse Practitioner | Admitting: Nurse Practitioner

## 2023-07-06 DIAGNOSIS — B370 Candidal stomatitis: Secondary | ICD-10-CM | POA: Diagnosis not present

## 2023-07-06 DIAGNOSIS — R8290 Unspecified abnormal findings in urine: Secondary | ICD-10-CM

## 2023-07-06 DIAGNOSIS — R112 Nausea with vomiting, unspecified: Secondary | ICD-10-CM | POA: Diagnosis not present

## 2023-07-06 LAB — POCT URINALYSIS DIP (MANUAL ENTRY)
Glucose, UA: 100 mg/dL — AB
Nitrite, UA: NEGATIVE
Spec Grav, UA: 1.01
Urobilinogen, UA: 0.2 U/dL
pH, UA: 7.5

## 2023-07-06 MED ORDER — ONDANSETRON 4 MG PO TBDP: 4.0000 mg | ORAL_TABLET | Freq: Three times a day (TID) | ORAL | 0 refills | Status: DC | PRN

## 2023-07-06 MED ORDER — NITROFURANTOIN MONOHYD MACRO 100 MG PO CAPS: 100.0000 mg | ORAL_CAPSULE | Freq: Two times a day (BID) | ORAL | 0 refills | Status: AC

## 2023-07-06 MED ORDER — CLOTRIMAZOLE 10 MG MT TROC: 10.0000 mg | Freq: Every day | OROMUCOSAL | 0 refills | Status: AC

## 2023-07-06 NOTE — ED Triage Notes (Signed)
 Pt reports burning mouth, weakness, nausea and vomiting x 1 week. Was seen last on 06/26/2023 for similar sx's

## 2023-07-06 NOTE — ED Provider Notes (Signed)
 [facility]    CSN: [id number] Arrival date & time: 07/06/23  1516      History   Chief Complaint No chief complaint on file.   HPI Adrian Bird is a 68 y.o. male.   The history is provided by the patient.   Patient presents for complaints of pain in his mouth, weakness, nausea, and vomiting for the past week.  Patient states he was seen in this clinic on 06/26/2023 with the same or similar symptoms.  Patient was diagnosed with with COPD exacerbation and prescribed azithromycin  and Tessalon .  Patient states since that time, he has had persistent nausea and vomiting.  Reports he has been vomiting at least 2-3 times daily.  He also complains of pain in his mouth that also feels as if his throat is hurting.  Patient states that he does use inhalers for his COPD, states that he rinses his mouth daily.  Patient feels that his weakness is due to his nausea and vomiting.  Patient denies recent fever, chills, headache, chest pain, abdominal pain, diarrhea, constipation, or rash.  He has not taken any medication for his symptoms.  Patient has not seen his PCP since his previous visit.  Past Medical History:  Diagnosis Date   Acid reflux    Asthma    Depression    Epilepsy (HCC)    High blood pressure    Urticaria     Patient Active Problem List   Diagnosis Date Noted   COPD exacerbation (HCC) 03/29/2023   Insomnia 09/26/2022   Gastroesophageal reflux disease 11/03/2021   History of epilepsy 07/26/2021   OSA on CPAP 12/15/2020   Tobacco abuse 12/15/2020   COPD with chronic bronchitis and emphysema (HCC) 12/15/2020   Essential tremor 10/15/2020   Anxiety disorder 09/16/2019   Chronic low back pain 09/16/2019   Long-term current use of opiate analgesic 09/16/2019   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/16/2019   Essential hypertension 08/20/2014   Arthritis 10/04/2012   Mixed hyperlipidemia 10/04/2012    Past Surgical History:  Procedure Laterality  Date   BIOPSY  01/27/2022   Procedure: BIOPSY;  Surgeon: Urban Garden, MD;  Location: [facility];  Service: Gastroenterology;;   CYST REMOVAL NECK     CYST REMOVAL TRUNK     ESOPHAGOGASTRODUODENOSCOPY (EGD) WITH PROPOFOL  N/A 01/27/2022   Procedure: ESOPHAGOGASTRODUODENOSCOPY (EGD) WITH PROPOFOL ;  Surgeon: Urban Garden, MD;  Location: [facility];  Service: Gastroenterology;  Laterality: N/A;  1:30 pm, pt knows to arrive at 8:00   EYE SURGERY Bilateral    HERNIA REPAIR     KNEE SURGERY Left    TONSILLECTOMY         Home Medications    Prior to Admission medications   Medication Sig Start Date End Date Taking? Authorizing Provider  clotrimazole (MYCELEX) 10 MG troche Take 1 tablet (10 mg total) by mouth 5 (five) times daily for 14 days. 07/06/23 07/20/23 Yes Leath-Warren, Belen Bowers, NP  nitrofurantoin, macrocrystal-monohydrate, (MACROBID) 100 MG capsule Take 1 capsule (100 mg total) by mouth 2 (two) times daily for 7 days. 07/06/23 07/13/23 Yes Leath-Warren, Belen Bowers, NP  ondansetron  (ZOFRAN -ODT) 4 MG disintegrating tablet Take 1 tablet (4 mg total) by mouth every 8 (eight) hours as needed. 07/06/23  Yes Leath-Warren, Belen Bowers, NP  albuterol  (VENTOLIN  HFA) 108 (90 Base) MCG/ACT inhaler inhale 2 puffs into the lungs every 6 (six) hours as needed. for wheezing 06/28/23   Cook, Jayce G, DO  atorvastatin  (LIPITOR) 80 MG tablet TAKE 1 TABLET BY MOUTH AT BEDTIME. 05/31/23   Cook, Jayce G, DO  azithromycin  (ZITHROMAX ) 250 MG tablet Take (2) tablets by mouth on day 1, then take (1) tablet by mouth on days 2-5. 06/26/23   Wilhemena Harbour, NP  benzonatate  (TESSALON ) 100 MG capsule Take 1 capsule (100 mg total) by mouth 3 (three) times daily as needed for cough. Do not take with alcohol  or while operating or driving heavy machinery 1/61/09   Wilhemena Harbour, NP  busPIRone  (BUSPAR ) 5 MG tablet Take 1 tablet (5 mg total) by mouth 2 (two) times daily as needed. 05/31/23    Cook, Jayce G, DO  carbamazepine  (TEGRETOL ) 200 MG tablet Take 1 tablet (200 mg total) by mouth 3 (three) times daily. 05/31/23   Cook, Jayce G, DO  diltiazem  (CARDIZEM  CD) 120 MG 24 hr capsule Take 1 capsule (120 mg total) by mouth daily. 05/31/23   Cook, Jayce G, DO  ezetimibe  (ZETIA ) 10 MG tablet TAKE 1 TABLET BY MOUTH AT BEDTIME. 05/31/23   Cook, Jayce G, DO  famotidine  (PEPCID ) 20 MG tablet TAKE (1) TABLET TWICE DAILY. 05/31/23   Cook, Jayce G, DO  FLUoxetine  (PROZAC ) 10 MG capsule Take 1 capsule (10 mg total) by mouth daily. 05/31/23   Cook, Jayce G, DO  fluticasone  (FLONASE ) 50 MCG/ACT nasal spray SHAKE LIQUID AND USE 1 SPRAY IN EACH NOSTRIL IN THE MORNING AND AT BEDTIME 05/01/23   Cook, Jayce G, DO  gabapentin  (NEURONTIN ) 300 MG capsule take 1 capsule by mouth twice daily. 06/01/23   Cook, Jayce G, DO  glucose blood (ACCU-CHEK GUIDE TEST) test strip use to check blood sugar 3 times daily. 06/01/23   Cook, Jayce G, DO  levETIRAcetam  (KEPPRA ) 750 MG tablet Take (1) tablet twice daily. 05/31/23   Cook, Jayce G, DO  linaclotide  (LINZESS ) 290 MCG CAPS capsule Take 1 capsule (290 mcg total) by mouth every morning. 05/15/23   Cook, Jayce G, DO  lisinopril  (ZESTRIL ) 20 MG tablet Take 1 tablet (20 mg total) by mouth every morning. 05/31/23   Cook, Jayce G, DO  montelukast  (SINGULAIR ) 10 MG tablet Take 1 tablet (10 mg total) by mouth at bedtime. 06/01/23   Cook, Jayce G, DO  Multiple Vitamin (MULTIVITAMIN WITH MINERALS) TABS tablet Take 1 tablet by mouth daily.    [provider]  Oxycodone HCl 10 MG TABS Take 10 mg by mouth in the morning, at noon, in the evening, and at bedtime.    [provider]  pantoprazole  (PROTONIX ) 40 MG tablet Take 1 tablet (40 mg total) by mouth 2 (two) times daily before a meal. 05/31/23   Cook, Jayce G, DO  sucralfate  (CARAFATE ) 1 g tablet take 1 tablet (1 g total) by mouth with breakfast, with lunch, and with evening meal. 06/28/23   Cook, Jayce G, DO  sucralfate   (CARAFATE ) 1 GM/10ML suspension take 10mls (1 gram total) by mouth 4 times daily with meals and at bedtime. 06/28/23   Cook, Jayce G, DO  tamsulosin  (FLOMAX ) 0.4 MG CAPS capsule Take 1 capsule (0.4 mg total) by mouth daily. 06/01/23   Cook, Jayce G, DO  traZODone  (DESYREL ) 50 MG tablet take 1 tablet (50 MILLIGRAM total) by mouth at bedtime. 06/01/23   Cook, Jayce G, DO  triamcinolone  cream (KENALOG ) 0.1 % Apply 1 Application topically 2 (two) times daily. 10/04/22   Leath-Warren, Belen Bowers, NP  umeclidinium-vilanterol (ANORO ELLIPTA ) 62.5-25 MCG/ACT AEPB use (1) puff daily  as directed. 06/28/23   Myrna Ast, DO    Family History Family History  Problem Relation Age of Onset   Congestive Heart Failure Mother    Lung cancer Father    Diabetes Sister    Diabetes Brother    Diabetes Maternal Aunt    Lung cancer Maternal Grandfather     Social History Social History   Tobacco Use   Smoking status: Every Day    Current packs/day: 0.25    Average packs/day: 0.3 packs/day for 50.0 years (12.5 ttl pk-yrs)    Types: E-cigarettes, Cigarettes   Smokeless tobacco: Never  Vaping Use   Vaping status: Some Days  Substance Use Topics   Alcohol  use: No   Drug use: No     Allergies   Cephalexin, Doxycycline, Penicillins, and Sulfa antibiotics   Review of Systems Review of Systems Per HPI  Physical Exam Triage Vital Signs ED Triage Vitals  Encounter Vitals Group     BP 07/06/23 1522 (!) 166/94     Systolic BP Percentile --      Diastolic BP Percentile --      Pulse Rate 07/06/23 1522 85     Resp 07/06/23 1522 16     Temp 07/06/23 1522 97.7 F (36.5 C)     Temp Source 07/06/23 1522 Oral     SpO2 07/06/23 1522 92 %     Weight --      Height --      Head Circumference --      Peak Flow --      Pain Score 07/06/23 1525 7     Pain Loc --      Pain Education --      Exclude from Growth Chart --    No data found.  Updated Vital Signs BP (!) 166/94 (BP Location: Right Arm)    Pulse 85   Temp 97.7 F (36.5 C) (Oral)   Resp 16   SpO2 92%   Visual Acuity Right Eye Distance:   Left Eye Distance:   Bilateral Distance:    Right Eye Near:   Left Eye Near:    Bilateral Near:     Physical Exam Vitals and nursing note reviewed.  Constitutional:      General: He is not in acute distress.    Appearance: Normal appearance.  HENT:     Head: Normocephalic.     Mouth/Throat:     Lips: Pink.     Mouth: Mucous membranes are moist. No oral lesions.     Dentition: Abnormal dentition.     Tongue: No lesions. Tongue does not deviate from midline.     Pharynx: Oropharynx is clear. Uvula midline.     Comments: White coating present on tongue Eyes:     Extraocular Movements: Extraocular movements intact.     Pupils: Pupils are equal, round, and reactive to light.  Cardiovascular:     Rate and Rhythm: Normal rate and regular rhythm.     Pulses: Normal pulses.     Heart sounds: Normal heart sounds.  Pulmonary:     Effort: Pulmonary effort is normal. No respiratory distress.     Breath sounds: Normal breath sounds. No stridor. No wheezing, rhonchi or rales.  Chest:     Chest wall: No tenderness.  Abdominal:     General: Bowel sounds are normal. There is no distension.     Palpations: Abdomen is soft. There is no mass.     Tenderness: There is  no abdominal tenderness. There is no right CVA tenderness, left CVA tenderness, guarding or rebound.     Hernia: No hernia is present.  Musculoskeletal:     Cervical back: Normal range of motion.  Skin:    General: Skin is warm and dry.  Neurological:     General: No focal deficit present.     Mental Status: He is alert and oriented to person, place, and time.  Psychiatric:        Mood and Affect: Mood normal.        Behavior: Behavior normal.      UC Treatments / Results  Labs (all labs ordered are listed, but only abnormal results are displayed) Labs Reviewed  POCT URINALYSIS DIP (MANUAL ENTRY) - Abnormal;  Notable for the following components:      Result Value   Color, UA light yellow (*)    Clarity, UA cloudy (*)    Glucose, UA =100 (*)    Bilirubin, UA small (*)    Ketones, POC UA trace (5) (*)    Blood, UA moderate (*)    Protein Ur, POC trace (*)    Leukocytes, UA Trace (*)    All other components within normal limits  URINE CULTURE    EKG   Radiology No results found.  Procedures Procedures (including critical care time)  Medications Ordered in UC Medications - No data to display  Initial Impression / Assessment and Plan / UC Course  I have reviewed the triage vital signs and the nursing notes.  Pertinent labs & imaging results that were available during my care of the patient were reviewed by me and considered in my medical decision making (see chart for details).  Urinalysis performed to evaluate signs of dehydration.  Patient does have moderate blood noted on the urinalysis along with protein and leukocytes.  Urine culture is pending.  There is concern for complicated UTI based on the urinalysis results.  Patient also with a white coating on his tongue, consistent with oral thrush.  Will treat for complicated UTI while urine culture is pending with will provide symptomatic treatment with Macrobid 100 mg, ondansetron  4 mg ODT for nausea and vomiting, and clotrimazole troche for oral thrush.  Supportive care recommendations were provided and discussed with the patient to include fluids, rest, and over-the-counter analgesics.  Patient was advised to follow-up with his PCP if symptoms fail to improve.  Patient was also given strict ER follow-up precautions.  Patient was in agreement with this plan of care and verbalizes understanding.  All questions were answered.  Patient stable for discharge.  Final Clinical Impressions(s) / UC Diagnoses   Final diagnoses:  Nausea and vomiting, unspecified vomiting type  Abnormal urinalysis  Oral thrush     Discharge Instructions       A urine culture has been ordered.  You will be contacted if the results of the culture are negative and advised to stop the antibiotic.  You may also be contacted if the culture result is positive and advised that the medication needs to be changed. Take medication as prescribed. Increase fluids allow for plenty of rest.  Recommend Pedialyte or Gatorade to prevent dehydration. You may take over-the-counter Tylenol as needed for pain, fever, or general discomfort. Recommend a brat diet while nausea and vomiting persist.  This includes bananas, rice, applesauce, and toast. Make sure you are rinsing your mouth thoroughly after use of your inhalers. As discussed, if your symptoms are failing to improve and  your urine culture is negative, it is recommended that you follow-up with your primary care physician for further evaluation. Go to the emergency department immediately if you develop worsening nausea, vomiting, or other concerns. Follow-up as needed.   ED Prescriptions     Medication Sig Dispense Auth. Provider   clotrimazole  (MYCELEX ) 10 MG troche Take 1 tablet (10 mg total) by mouth 5 (five) times daily for 14 days. 70 tablet Leath-Warren, Belen Bowers, NP   ondansetron  (ZOFRAN -ODT) 4 MG disintegrating tablet Take 1 tablet (4 mg total) by mouth every 8 (eight) hours as needed. 20 tablet Leath-Warren, Belen Bowers, NP   nitrofurantoin , macrocrystal-monohydrate, (MACROBID ) 100 MG capsule Take 1 capsule (100 mg total) by mouth 2 (two) times daily for 7 days. 14 capsule Leath-Warren, Belen Bowers, NP      PDMP not reviewed this encounter.   Hardy Lia, NP 07/06/23 2005

## 2023-07-06 NOTE — Discharge Instructions (Addendum)
 A urine culture has been ordered.  You will be contacted if the results of the culture are negative and advised to stop the antibiotic.  You may also be contacted if the culture result is positive and advised that the medication needs to be changed. Take medication as prescribed. Increase fluids allow for plenty of rest.  Recommend Pedialyte or Gatorade to prevent dehydration. You may take over-the-counter Tylenol as needed for pain, fever, or general discomfort. Recommend a brat diet while nausea and vomiting persist.  This includes bananas, rice, applesauce, and toast. Make sure you are rinsing your mouth thoroughly after use of your inhalers. As discussed, if your symptoms are failing to improve and your urine culture is negative, it is recommended that you follow-up with your primary care physician for further evaluation. Go to the emergency department immediately if you develop worsening nausea, vomiting, or other concerns. Follow-up as needed.

## 2023-07-07 LAB — URINE CULTURE: Culture: <10000 — AB

## 2023-07-10 ENCOUNTER — Ambulatory Visit (HOSPITAL_COMMUNITY): Payer: Self-pay

## 2023-07-17 DIAGNOSIS — Z79891 Long term (current) use of opiate analgesic: Secondary | ICD-10-CM | POA: Diagnosis not present

## 2023-07-17 DIAGNOSIS — M62830 Muscle spasm of back: Secondary | ICD-10-CM | POA: Diagnosis not present

## 2023-07-17 DIAGNOSIS — G894 Chronic pain syndrome: Secondary | ICD-10-CM | POA: Diagnosis not present

## 2023-07-17 DIAGNOSIS — M5459 Other low back pain: Secondary | ICD-10-CM | POA: Diagnosis not present

## 2023-07-17 DIAGNOSIS — Z79899 Other long term (current) drug therapy: Secondary | ICD-10-CM | POA: Diagnosis not present

## 2023-07-18 DIAGNOSIS — G4733 Obstructive sleep apnea (adult) (pediatric): Secondary | ICD-10-CM | POA: Diagnosis not present

## 2023-07-21 DIAGNOSIS — G4733 Obstructive sleep apnea (adult) (pediatric): Secondary | ICD-10-CM | POA: Diagnosis not present

## 2023-07-26 ENCOUNTER — Encounter (HOSPITAL_COMMUNITY): Payer: Self-pay

## 2023-07-26 ENCOUNTER — Emergency Department (HOSPITAL_COMMUNITY)
Admission: EM | Admit: 2023-07-26 | Discharge: 2023-07-26 | Disposition: A | Discharge status: Home / Self Care | Attending: Emergency Medicine | Admitting: Emergency Medicine

## 2023-07-26 ENCOUNTER — Ambulatory Visit
Admission: EM | Admit: 2023-07-26 | Discharge: 2023-07-26 | Disposition: A | Discharge status: Home / Self Care | Attending: Family Medicine | Admitting: Family Medicine

## 2023-07-26 ENCOUNTER — Emergency Department (HOSPITAL_COMMUNITY)

## 2023-07-26 ENCOUNTER — Other Ambulatory Visit: Payer: Self-pay

## 2023-07-26 DIAGNOSIS — R030 Elevated blood-pressure reading, without diagnosis of hypertension: Secondary | ICD-10-CM | POA: Diagnosis not present

## 2023-07-26 DIAGNOSIS — J449 Chronic obstructive pulmonary disease, unspecified: Secondary | ICD-10-CM | POA: Insufficient documentation

## 2023-07-26 DIAGNOSIS — K8020 Calculus of gallbladder without cholecystitis without obstruction: Secondary | ICD-10-CM | POA: Diagnosis not present

## 2023-07-26 DIAGNOSIS — D649 Anemia, unspecified: Secondary | ICD-10-CM | POA: Insufficient documentation

## 2023-07-26 DIAGNOSIS — R1084 Generalized abdominal pain: Secondary | ICD-10-CM | POA: Insufficient documentation

## 2023-07-26 DIAGNOSIS — R109 Unspecified abdominal pain: Secondary | ICD-10-CM | POA: Diagnosis not present

## 2023-07-26 DIAGNOSIS — R112 Nausea with vomiting, unspecified: Secondary | ICD-10-CM | POA: Insufficient documentation

## 2023-07-26 DIAGNOSIS — Z79899 Other long term (current) drug therapy: Secondary | ICD-10-CM | POA: Insufficient documentation

## 2023-07-26 DIAGNOSIS — R1110 Vomiting, unspecified: Secondary | ICD-10-CM | POA: Diagnosis not present

## 2023-07-26 DIAGNOSIS — R1030 Lower abdominal pain, unspecified: Secondary | ICD-10-CM | POA: Diagnosis not present

## 2023-07-26 DIAGNOSIS — Z7951 Long term (current) use of inhaled steroids: Secondary | ICD-10-CM | POA: Diagnosis not present

## 2023-07-26 DIAGNOSIS — R197 Diarrhea, unspecified: Secondary | ICD-10-CM

## 2023-07-26 DIAGNOSIS — R9431 Abnormal electrocardiogram [ECG] [EKG]: Secondary | ICD-10-CM | POA: Diagnosis not present

## 2023-07-26 DIAGNOSIS — I10 Essential (primary) hypertension: Secondary | ICD-10-CM | POA: Diagnosis not present

## 2023-07-26 DIAGNOSIS — N281 Cyst of kidney, acquired: Secondary | ICD-10-CM | POA: Diagnosis not present

## 2023-07-26 DIAGNOSIS — K5730 Diverticulosis of large intestine without perforation or abscess without bleeding: Secondary | ICD-10-CM | POA: Diagnosis not present

## 2023-07-26 LAB — CBC WITH DIFFERENTIAL/PLATELET
Abs Immature Granulocytes: 0.02 10*3/uL (ref 0.00–0.07)
Basophils Absolute: 0.1 10*3/uL (ref 0.0–0.1)
Basophils Relative: 1 %
Eosinophils Absolute: 0.1 10*3/uL (ref 0.0–0.5)
Eosinophils Relative: 2 %
HCT: 37.2 % — ABNORMAL LOW (ref 39.0–52.0)
Hemoglobin: 11.9 g/dL — ABNORMAL LOW (ref 13.0–17.0)
Immature Granulocytes: 0 %
Lymphocytes Relative: 15 %
Lymphs Abs: 1.3 10*3/uL (ref 0.7–4.0)
MCH: 27.2 pg (ref 26.0–34.0)
MCHC: 32 g/dL (ref 30.0–36.0)
MCV: 84.9 fL (ref 80.0–100.0)
Monocytes Absolute: 1.3 10*3/uL — ABNORMAL HIGH (ref 0.1–1.0)
Monocytes Relative: 15 %
Neutro Abs: 5.6 10*3/uL (ref 1.7–7.7)
Neutrophils Relative %: 67 %
Platelets: 349 10*3/uL (ref 150–400)
RBC: 4.38 MIL/uL (ref 4.22–5.81)
RDW: 13.4 % (ref 11.5–15.5)
WBC: 8.4 10*3/uL (ref 4.0–10.5)
nRBC: 0 % (ref 0.0–0.2)

## 2023-07-26 LAB — COMPREHENSIVE METABOLIC PANEL WITH GFR
ALT: 23 U/L (ref 0–44)
AST: 20 U/L (ref 15–41)
Albumin: 4.2 g/dL (ref 3.5–5.0)
Alkaline Phosphatase: 90 U/L (ref 38–126)
Anion gap: 9 (ref 5–15)
BUN: 8 mg/dL (ref 8–23)
CO2: 26 mmol/L (ref 22–32)
Calcium: 9.4 mg/dL (ref 8.9–10.3)
Chloride: 100 mmol/L (ref 98–111)
Creatinine, Ser: 0.67 mg/dL (ref 0.61–1.24)
GFR, Estimated: >60 mL/min (ref >60)
Glucose, Bld: 98 mg/dL (ref 70–99)
Potassium: 4.5 mmol/L (ref 3.5–5.1)
Sodium: 135 mmol/L (ref 135–145)
Total Bilirubin: 0.3 mg/dL (ref 0.0–1.2)
Total Protein: 7.2 g/dL (ref 6.5–8.1)

## 2023-07-26 LAB — URINALYSIS, ROUTINE W REFLEX MICROSCOPIC
Bilirubin Urine: NEGATIVE
Glucose, UA: NEGATIVE mg/dL
Hgb urine dipstick: NEGATIVE
Ketones, ur: NEGATIVE mg/dL
Leukocytes,Ua: NEGATIVE
Nitrite: NEGATIVE
Protein, ur: NEGATIVE mg/dL
Specific Gravity, Urine: 1.008 (ref 1.005–1.030)
pH: 8 (ref 5.0–8.0)

## 2023-07-26 LAB — LIPASE, BLOOD: Lipase: 31 U/L (ref 11–51)

## 2023-07-26 LAB — TROPONIN I (HIGH SENSITIVITY): Troponin I (High Sensitivity): 6 ng/L (ref <18)

## 2023-07-26 MED ORDER — LISINOPRIL 10 MG PO TABS: 20.0000 mg | ORAL_TABLET | Freq: Every morning | ORAL | Status: DC

## 2023-07-26 MED ORDER — ONDANSETRON HCL 4 MG/2ML IJ SOLN
4.0000 mg | Freq: Once | INTRAMUSCULAR | Status: AC
  Administered 2023-07-26: 4 mg via INTRAVENOUS
  Filled 2023-07-26: qty 2

## 2023-07-26 MED ORDER — ONDANSETRON HCL 4 MG PO TABS
4.0000 mg | ORAL_TABLET | Freq: Four times a day (QID) | ORAL | 0 refills | Status: DC
Start: 2023-07-26 — End: 2023-10-03

## 2023-07-26 MED ORDER — FAMOTIDINE 20 MG PO TABS
20.0000 mg | ORAL_TABLET | Freq: Once | ORAL | Status: AC
  Administered 2023-07-26: 20 mg via ORAL
  Filled 2023-07-26: qty 1

## 2023-07-26 MED ORDER — IOHEXOL 300 MG/ML  SOLN
100.0000 mL | Freq: Once | INTRAMUSCULAR | Status: AC | PRN
  Administered 2023-07-26: 100 mL via INTRAVENOUS

## 2023-07-26 MED ORDER — ONDANSETRON 4 MG PO TBDP
4.0000 mg | ORAL_TABLET | Freq: Once | ORAL | Status: AC
  Administered 2023-07-26: 4 mg via ORAL

## 2023-07-26 MED ORDER — MORPHINE SULFATE (PF) 4 MG/ML IV SOLN
4.0000 mg | Freq: Once | INTRAVENOUS | Status: AC
  Administered 2023-07-26: 4 mg via INTRAVENOUS
  Filled 2023-07-26: qty 1

## 2023-07-26 MED ORDER — SODIUM CHLORIDE 0.9 % IV BOLUS
1000.0000 mL | Freq: Once | INTRAVENOUS | Status: AC
  Administered 2023-07-26: 1000 mL via INTRAVENOUS

## 2023-07-26 MED ORDER — ALUM & MAG HYDROXIDE-SIMETH 200-200-20 MG/5ML PO SUSP
30.0000 mL | Freq: Once | ORAL | Status: AC
  Administered 2023-07-26: 30 mL via ORAL
  Filled 2023-07-26: qty 30

## 2023-07-26 NOTE — Discharge Instructions (Signed)
 I recommend you go to the emergency department for further evaluation.  We do not recommend that you drive yourself.

## 2023-07-26 NOTE — ED Notes (Signed)
 Patient is being discharged from the Urgent Care and sent to the Emergency Department via POV. Per Tacy Expose PA-C, patient is in need of higher level of care due to severe abdominal pain. Patient is aware and verbalizes understanding of plan of care.  Vitals:   07/26/23 1349  BP: (!) 190/94  Pulse: 84  Resp: (!) 24  Temp: 98.6 F (37 C)  SpO2: 96%

## 2023-07-26 NOTE — ED Provider Notes (Signed)
 [facility]    CSN: [id number] Arrival date & time: 07/26/23  1320      History   Chief Complaint No chief complaint on file.   HPI Adrian Bird is a 68 y.o. male.   Patient presenting today with 1 week history of progressively worsening severe lower abdominal pain worse on the right, nausea, vomiting, loose stools, foul smelling odor to stools, weight loss, weakness.  Denies upper respiratory symptoms chest pain, shortness of breath, fevers, rashes, new foods or medications, recent sick contacts.  So far has not been trying anything over-the-counter for symptoms.  Of note, has been on 2 courses of antibiotics in the past month, Zithromax  for an upper respiratory issue and Macrobid  for a possible urinary tract infection.    Past Medical History:  Diagnosis Date   Acid reflux    Asthma    Depression    Epilepsy (HCC)    High blood pressure    Urticaria     Patient Active Problem List   Diagnosis Date Noted   COPD exacerbation (HCC) 03/29/2023   Insomnia 09/26/2022   Gastroesophageal reflux disease 11/03/2021   History of epilepsy 07/26/2021   OSA on CPAP 12/15/2020   Tobacco abuse 12/15/2020   COPD with chronic bronchitis and emphysema (HCC) 12/15/2020   Essential tremor 10/15/2020   Anxiety disorder 09/16/2019   Chronic low back pain 09/16/2019   Long-term current use of opiate analgesic 09/16/2019   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/16/2019   Essential hypertension 08/20/2014   Arthritis 10/04/2012   Mixed hyperlipidemia 10/04/2012    Past Surgical History:  Procedure Laterality Date   BIOPSY  01/27/2022   Procedure: BIOPSY;  Surgeon: Umberto Ganong, Bearl Limes, MD;  Location: [facility];  Service: Gastroenterology;;   CYST REMOVAL NECK     CYST REMOVAL TRUNK     ESOPHAGOGASTRODUODENOSCOPY (EGD) WITH PROPOFOL  N/A 01/27/2022   Procedure: ESOPHAGOGASTRODUODENOSCOPY (EGD) WITH PROPOFOL ;  Surgeon: Urban Garden, MD;   Location: [facility];  Service: Gastroenterology;  Laterality: N/A;  1:30 pm, pt knows to arrive at 8:00   EYE SURGERY Bilateral    HERNIA REPAIR     KNEE SURGERY Left    TONSILLECTOMY         Home Medications    Prior to Admission medications   Medication Sig Start Date End Date Taking? Authorizing Provider  albuterol  (VENTOLIN  HFA) 108 (90 Base) MCG/ACT inhaler inhale 2 puffs into the lungs every 6 (six) hours as needed. for wheezing 06/28/23   Cook, Jayce G, DO  atorvastatin  (LIPITOR) 80 MG tablet TAKE 1 TABLET BY MOUTH AT BEDTIME. 05/31/23   Cook, Jayce G, DO  azithromycin  (ZITHROMAX ) 250 MG tablet Take (2) tablets by mouth on day 1, then take (1) tablet by mouth on days 2-5. 06/26/23   Wilhemena Harbour, NP  benzonatate  (TESSALON ) 100 MG capsule Take 1 capsule (100 mg total) by mouth 3 (three) times daily as needed for cough. Do not take with alcohol  or while operating or driving heavy machinery 7/82/95   Wilhemena Harbour, NP  busPIRone  (BUSPAR ) 5 MG tablet Take 1 tablet (5 mg total) by mouth 2 (two) times daily as needed. 05/31/23   Cook, Jayce G, DO  carbamazepine  (TEGRETOL ) 200 MG tablet Take 1 tablet (200 mg total) by mouth 3 (three) times daily. 05/31/23   Cook, Jayce G, DO  diltiazem  (CARDIZEM  CD) 120 MG 24 hr capsule Take 1 capsule (120 mg total) by mouth daily.  05/31/23   Cook, Jayce G, DO  ezetimibe  (ZETIA ) 10 MG tablet TAKE 1 TABLET BY MOUTH AT BEDTIME. 05/31/23   Cook, Jayce G, DO  famotidine  (PEPCID ) 20 MG tablet TAKE (1) TABLET TWICE DAILY. 05/31/23   Cook, Jayce G, DO  FLUoxetine  (PROZAC ) 10 MG capsule Take 1 capsule (10 mg total) by mouth daily. 05/31/23   Cook, Jayce G, DO  fluticasone  (FLONASE ) 50 MCG/ACT nasal spray SHAKE LIQUID AND USE 1 SPRAY IN EACH NOSTRIL IN THE MORNING AND AT BEDTIME 05/01/23   Cook, Jayce G, DO  gabapentin  (NEURONTIN ) 300 MG capsule take 1 capsule by mouth twice daily. 06/01/23   Cook, Jayce G, DO  glucose blood (ACCU-CHEK GUIDE TEST) test strip  use to check blood sugar 3 times daily. 06/01/23   Cook, Jayce G, DO  levETIRAcetam  (KEPPRA ) 750 MG tablet Take (1) tablet twice daily. 05/31/23   Cook, Jayce G, DO  linaclotide  (LINZESS ) 290 MCG CAPS capsule Take 1 capsule (290 mcg total) by mouth every morning. 05/15/23   Cook, Jayce G, DO  lisinopril  (ZESTRIL ) 20 MG tablet Take 1 tablet (20 mg total) by mouth every morning. 05/31/23   Cook, Jayce G, DO  montelukast  (SINGULAIR ) 10 MG tablet Take 1 tablet (10 mg total) by mouth at bedtime. 06/01/23   Cook, Jayce G, DO  Multiple Vitamin (MULTIVITAMIN WITH MINERALS) TABS tablet Take 1 tablet by mouth daily.    [provider]  ondansetron  (ZOFRAN -ODT) 4 MG disintegrating tablet Take 1 tablet (4 mg total) by mouth every 8 (eight) hours as needed. 07/06/23   Leath-Warren, Belen Bowers, NP  Oxycodone HCl 10 MG TABS Take 10 mg by mouth in the morning, at noon, in the evening, and at bedtime.    [provider]  pantoprazole  (PROTONIX ) 40 MG tablet Take 1 tablet (40 mg total) by mouth 2 (two) times daily before a meal. 05/31/23   Cook, Jayce G, DO  sucralfate  (CARAFATE ) 1 g tablet take 1 tablet (1 g total) by mouth with breakfast, with lunch, and with evening meal. 06/28/23   Cook, Jayce G, DO  sucralfate  (CARAFATE ) 1 GM/10ML suspension take 10mls (1 gram total) by mouth 4 times daily with meals and at bedtime. 06/28/23   Cook, Jayce G, DO  tamsulosin  (FLOMAX ) 0.4 MG CAPS capsule Take 1 capsule (0.4 mg total) by mouth daily. 06/01/23   Cook, Jayce G, DO  traZODone  (DESYREL ) 50 MG tablet take 1 tablet (50 MILLIGRAM total) by mouth at bedtime. 06/01/23   Cook, Jayce G, DO  triamcinolone  cream (KENALOG ) 0.1 % Apply 1 Application topically 2 (two) times daily. 10/04/22   Leath-Warren, Belen Bowers, NP  umeclidinium-vilanterol (ANORO ELLIPTA ) 62.5-25 MCG/ACT AEPB use (1) puff daily as directed. 06/28/23   Cook, Jayce G, DO    Family History Family History  Problem Relation Age of Onset   Congestive Heart  Failure Mother    Lung cancer Father    Diabetes Sister    Diabetes Brother    Diabetes Maternal Aunt    Lung cancer Maternal Grandfather     Social History Social History   Tobacco Use   Smoking status: Every Day    Current packs/day: 0.25    Average packs/day: 0.3 packs/day for 50.0 years (12.5 ttl pk-yrs)    Types: E-cigarettes, Cigarettes   Smokeless tobacco: Never  Vaping Use   Vaping status: Some Days  Substance Use Topics   Alcohol  use: No   Drug use: No  Allergies   Cephalexin, Doxycycline, Penicillins, and Sulfa antibiotics   Review of Systems Review of Systems PER HPI  Physical Exam Triage Vital Signs ED Triage Vitals  Encounter Vitals Group     BP 07/26/23 1349 (!) 190/94     Girls Systolic BP Percentile --      Girls Diastolic BP Percentile --      Boys Systolic BP Percentile --      Boys Diastolic BP Percentile --      Pulse Rate 07/26/23 1349 84     Resp 07/26/23 1349 (!) 24     Temp 07/26/23 1349 98.6 F (37 C)     Temp Source 07/26/23 1349 Oral     SpO2 07/26/23 1349 96 %     Weight --      Height --      Head Circumference --      Peak Flow --      Pain Score 07/26/23 1353 7     Pain Loc --      Pain Education --      Exclude from Growth Chart --    No data found.  Updated Vital Signs BP (!) 190/94 (BP Location: Right Arm)   Pulse 84   Temp 98.6 F (37 C) (Oral)   Resp (!) 24   SpO2 96%   Visual Acuity Right Eye Distance:   Left Eye Distance:   Bilateral Distance:    Right Eye Near:   Left Eye Near:    Bilateral Near:      Exam significantly abbreviated today as decision was made early into visit to go to the emergency department for further evaluation Physical Exam Vitals and nursing note reviewed.  Constitutional:      General: He is in acute distress.     Appearance: He is ill-appearing.     Comments: Clutching stomach, crying, appears in significant pain  HENT:     Head: Atraumatic.   Eyes:     Extraocular  Movements: Extraocular movements intact.     Conjunctiva/sclera: Conjunctivae normal.    Cardiovascular:     Rate and Rhythm: Normal rate and regular rhythm.  Pulmonary:     Comments: Mildly tachypneic likely secondary to pain  Musculoskeletal:        General: Normal range of motion.     Cervical back: Normal range of motion and neck supple.   Skin:    General: Skin is warm and dry.   Neurological:     Mental Status: He is alert and oriented to person, place, and time.   Psychiatric:        Mood and Affect: Mood normal.        Thought Content: Thought content normal.        Judgment: Judgment normal.      UC Treatments / Results  Labs (all labs ordered are listed, but only abnormal results are displayed) Labs Reviewed - No data to display  EKG   Radiology No results found.  Procedures Procedures (including critical care time)  Medications Ordered in UC Medications  ondansetron  (ZOFRAN -ODT) disintegrating tablet 4 mg (4 mg Oral Given 07/26/23 1407)    Initial Impression / Assessment and Plan / UC Course  I have reviewed the triage vital signs and the nursing notes.  Pertinent labs & imaging results that were available during my care of the patient were reviewed by me and considered in my medical decision making (see chart for details).  Significantly hypertensive in triage, likely secondary to pain but did discuss to continue to monitor this and follow-up with PCP if persisting.  Given severity, duration of pain, intolerance to p.o. and lack of immediate workup resources in the setting did recommend going to the emergency department for further immediate evaluation.  Patient and friend member with him today are readily agreeable and she wishes to take him via private vehicle.  They declined EMS transport.  A dose of Zofran  was given prior to transport to the emergency department for comfort.  Final Clinical Impressions(s) / UC Diagnoses   Final diagnoses:   Lower abdominal pain  Nausea vomiting and diarrhea  Elevated blood pressure reading     Discharge Instructions      I recommend you go to the emergency department for further evaluation.  We do not recommend that you drive yourself.   ED Prescriptions   None    PDMP not reviewed this encounter.   Corbin Dess, [place] 07/27/23 1428

## 2023-07-26 NOTE — ED Provider Notes (Signed)
 [place] [facility] AT [facility] Provider Note   CSN: [id number] Arrival date & time: 07/26/23  1439     Patient presents with: Emesis  HPI Adrian Bird is a 68 y.o. male with h/o hypertension, COPD, hyperlipidemia, GERD presenting for abdominal pain.  Started a week ago.  Endorses nausea and vomiting but no diarrhea.  Did have a bowel movement today.  Also states that yesterday started to have epigastric pain as well that at times radiates into his chest but denies shortness of breath.  Was seen earlier in urgent care and received p.o. Zofran  and states overall he is feeling better.  Denies fever at home and urinary symptoms.    Emesis      Prior to Admission medications   Medication Sig Start Date End Date Taking? Authorizing Provider  albuterol  (VENTOLIN  HFA) 108 (90 Base) MCG/ACT inhaler inhale 2 puffs into the lungs every 6 (six) hours as needed. for wheezing 06/28/23   Cook, Jayce G, DO  atorvastatin  (LIPITOR) 80 MG tablet TAKE 1 TABLET BY MOUTH AT BEDTIME. 05/31/23   Cook, Jayce G, DO  azithromycin  (ZITHROMAX ) 250 MG tablet Take (2) tablets by mouth on day 1, then take (1) tablet by mouth on days 2-5. 06/26/23   Wilhemena Harbour, NP  benzonatate  (TESSALON ) 100 MG capsule Take 1 capsule (100 mg total) by mouth 3 (three) times daily as needed for cough. Do not take with alcohol  or while operating or driving heavy machinery 7/82/95   Wilhemena Harbour, NP  busPIRone  (BUSPAR ) 5 MG tablet Take 1 tablet (5 mg total) by mouth 2 (two) times daily as needed. 05/31/23   Cook, Jayce G, DO  carbamazepine  (TEGRETOL ) 200 MG tablet Take 1 tablet (200 mg total) by mouth 3 (three) times daily. 05/31/23   Cook, Jayce G, DO  diltiazem  (CARDIZEM  CD) 120 MG 24 hr capsule Take 1 capsule (120 mg total) by mouth daily. 05/31/23   Cook, Jayce G, DO  ezetimibe  (ZETIA ) 10 MG tablet TAKE 1 TABLET BY MOUTH AT BEDTIME. 05/31/23   Cook, Jayce G, DO  famotidine  (PEPCID ) 20 MG  tablet TAKE (1) TABLET TWICE DAILY. 05/31/23   Cook, Jayce G, DO  FLUoxetine  (PROZAC ) 10 MG capsule Take 1 capsule (10 mg total) by mouth daily. 05/31/23   Cook, Jayce G, DO  fluticasone  (FLONASE ) 50 MCG/ACT nasal spray SHAKE LIQUID AND USE 1 SPRAY IN EACH NOSTRIL IN THE MORNING AND AT BEDTIME 05/01/23   Cook, Jayce G, DO  gabapentin  (NEURONTIN ) 300 MG capsule take 1 capsule by mouth twice daily. 06/01/23   Cook, Jayce G, DO  glucose blood (ACCU-CHEK GUIDE TEST) test strip use to check blood sugar 3 times daily. 06/01/23   Cook, Jayce G, DO  levETIRAcetam  (KEPPRA ) 750 MG tablet Take (1) tablet twice daily. 05/31/23   Cook, Jayce G, DO  linaclotide  (LINZESS ) 290 MCG CAPS capsule Take 1 capsule (290 mcg total) by mouth every morning. 05/15/23   Cook, Jayce G, DO  lisinopril  (ZESTRIL ) 20 MG tablet Take 1 tablet (20 mg total) by mouth every morning. 05/31/23   Cook, Jayce G, DO  montelukast  (SINGULAIR ) 10 MG tablet Take 1 tablet (10 mg total) by mouth at bedtime. 06/01/23   Cook, Jayce G, DO  Multiple Vitamin (MULTIVITAMIN WITH MINERALS) TABS tablet Take 1 tablet by mouth daily.    [provider]  ondansetron  (ZOFRAN -ODT) 4 MG disintegrating tablet Take 1 tablet (4 mg total) by mouth every 8 (eight)  hours as needed. 07/06/23   Leath-Warren, Belen Bowers, NP  Oxycodone HCl 10 MG TABS Take 10 mg by mouth in the morning, at noon, in the evening, and at bedtime.    [provider]  pantoprazole  (PROTONIX ) 40 MG tablet Take 1 tablet (40 mg total) by mouth 2 (two) times daily before a meal. 05/31/23   Cook, Jayce G, DO  sucralfate  (CARAFATE ) 1 g tablet take 1 tablet (1 g total) by mouth with breakfast, with lunch, and with evening meal. 06/28/23   Cook, Jayce G, DO  sucralfate  (CARAFATE ) 1 GM/10ML suspension take 10mls (1 gram total) by mouth 4 times daily with meals and at bedtime. 06/28/23   Cook, Jayce G, DO  tamsulosin  (FLOMAX ) 0.4 MG CAPS capsule Take 1 capsule (0.4 mg total) by mouth daily. 06/01/23    Cook, Jayce G, DO  traZODone  (DESYREL ) 50 MG tablet take 1 tablet (50 MILLIGRAM total) by mouth at bedtime. 06/01/23   Cook, Jayce G, DO  triamcinolone  cream (KENALOG ) 0.1 % Apply 1 Application topically 2 (two) times daily. 10/04/22   Leath-Warren, Belen Bowers, NP  umeclidinium-vilanterol (ANORO ELLIPTA ) 62.5-25 MCG/ACT AEPB use (1) puff daily as directed. 06/28/23   Cook, Jayce G, DO    Allergies: Cephalexin, Doxycycline, Penicillins, and Sulfa antibiotics    Review of Systems  Gastrointestinal:  Positive for vomiting.    Updated Vital Signs BP (!) 186/90   Pulse 95   Temp 98.1 F (36.7 C)   Resp 20   Ht 5' 8 (1.727 m)   Wt 90.3 kg   SpO2 95%   BMI 30.27 kg/m   Physical Exam Vitals and nursing note reviewed.  HENT:     Head: Normocephalic and atraumatic.     Mouth/Throat:     Mouth: Mucous membranes are moist.   Eyes:     General:        Right eye: No discharge.        Left eye: No discharge.     Conjunctiva/sclera: Conjunctivae normal.    Cardiovascular:     Rate and Rhythm: Normal rate and regular rhythm.     Pulses: Normal pulses.     Heart sounds: Normal heart sounds.  Pulmonary:     Effort: Pulmonary effort is normal.     Breath sounds: Normal breath sounds.  Abdominal:     General: Abdomen is flat.     Palpations: Abdomen is soft.     Tenderness: There is generalized abdominal tenderness.   Skin:    General: Skin is warm and dry.   Neurological:     General: No focal deficit present.   Psychiatric:        Mood and Affect: Mood normal.      (all labs ordered are listed, but only abnormal results are displayed) Labs Reviewed  URINALYSIS, ROUTINE W REFLEX MICROSCOPIC - Abnormal; Notable for the following components:      Result Value   Color, Urine STRAW (*)    All other components within normal limits  CBC WITH DIFFERENTIAL/PLATELET - Abnormal; Notable for the following components:   Hemoglobin 11.9 (*)    HCT 37.2 (*)    Monocytes Absolute  1.3 (*)    All other components within normal limits  LIPASE, BLOOD  COMPREHENSIVE METABOLIC PANEL WITH GFR  TROPONIN I (HIGH SENSITIVITY)    EKG: None  Radiology: CT ABDOMEN PELVIS W CONTRAST Result Date: 07/26/2023 CLINICAL DATA:  Abdominal pain, acute, nonlocalized EXAM: CT ABDOMEN AND PELVIS  WITH CONTRAST TECHNIQUE: Multidetector CT imaging of the abdomen and pelvis was performed using the standard protocol following bolus administration of intravenous contrast. RADIATION DOSE REDUCTION: This exam was performed according to the departmental dose-optimization program which includes automated exposure control, adjustment of the mA and/or kV according to patient size and/or use of iterative reconstruction technique. CONTRAST:  100mL OMNIPAQUE IOHEXOL 300 MG/ML  SOLN COMPARISON:  07/31/2015. FINDINGS: Lower chest: No acute abnormality. Hepatobiliary: No suspicious focal hepatic lesion. Similar peripherally calcified gallstone. No gallbladder wall thickening or pericholecystic inflammatory fluid. No biliary dilatation. Pancreas: Unremarkable. No pancreatic ductal dilatation or surrounding inflammatory changes. Spleen: Normal in size without focal abnormality. Adrenals/Urinary Tract: Adrenal glands are unremarkable. Kidneys enhance symmetrically. Simple cyst at the superior pole of the right kidney, for which no follow-up imaging is recommended. No renal calculi or hydronephrosis. Bladder is unremarkable. Stomach/Bowel: Stomach is within normal limits. Appendix appears normal. No evidence of bowel wall thickening, distention, or inflammatory changes. Sigmoid colonic diverticulosis without evidence of acute diverticulitis. Vascular/Lymphatic: Abdominal aorta is normal in caliber with atherosclerotic calcification. No enlarged abdominal or pelvic lymph nodes. Reproductive: Prostate is unremarkable. Other: No abdominopelvic ascites. No intraperitoneal free air. Prior ventral hernia mesh repair.  Musculoskeletal: No acute or significant osseous findings. IMPRESSION: 1. No acute localizing findings in the abdomen or pelvis. 2. Sigmoid colonic diverticulosis without evidence of acute diverticulitis. 3. Cholelithiasis without evidence of acute cholecystitis. 4.  Aortic Atherosclerosis (ICD10-I70.0). Electronically Signed   By: Mannie Seek M.D.   On: 07/26/2023 17:40   DG Chest Port 1 View Result Date: 07/26/2023 CLINICAL DATA:  Abdominal pain and vomiting. EXAM: PORTABLE CHEST 1 VIEW COMPARISON:  November 09, 2021 FINDINGS: The heart size and mediastinal contours are within normal limits. There is mild calcification of the aortic arch. Both lungs are clear. The visualized skeletal structures are unremarkable. IMPRESSION: No active disease. Electronically Signed   By: Virgle Grime M.D.   On: 07/26/2023 17:05     Procedures   Medications Ordered in the ED  lisinopril  (ZESTRIL ) tablet 20 mg (has no administration in time range)  sodium chloride  0.9 % bolus 1,000 mL (1,000 mLs Intravenous New Bag/Given 07/26/23 1631)  ondansetron  (ZOFRAN ) injection 4 mg (4 mg Intravenous Given 07/26/23 1631)  morphine  (PF) 4 MG/ML injection 4 mg (4 mg Intravenous Given 07/26/23 1631)  iohexol (OMNIPAQUE) 300 MG/ML solution 100 mL (100 mLs Intravenous Contrast Given 07/26/23 1641)  alum & mag hydroxide-simeth (MAALOX/MYLANTA) 200-200-20 MG/5ML suspension 30 mL (30 mLs Oral Given 07/26/23 1754)  famotidine  (PEPCID ) tablet 20 mg (20 mg Oral Given 07/26/23 1754)                                    Medical Decision Making Amount and/or Complexity of Data Reviewed Radiology: ordered.  Risk OTC drugs. Prescription drug management.   Initial Impression and Ddx 68 year old well-appearing male presenting for abdominal pain.  Exam notable for generalized abdominal tenderness.  DDx includes acute pancreatitis, acute cholecystitis, kidney stone, appendicitis, ACS, PE, bowel obstruction, other. Patient PMH that  increases complexity of ED encounter: hypertension, COPD, hyperlipidemia, GERD  Interpretation of Diagnostics - I independent reviewed and interpreted the labs as followed: mild anemia (11.9)  - I independently visualized the following imaging with scope of interpretation limited to determining acute life threatening conditions related to emergency care: CT abdomen pelvis, which revealed no acute findings but did show sigmoid diverticulosis and  cholelithiasis.  Shared findings with patient  -I personally reviewed interpreted EKG which revealed sinus rhythm.  Patient Reassessment and Ultimate Disposition/Management On reassessment, symptoms had improved.  CT scan and workup overall reassuring.  Reviewed his chart, and on prior CT scan in 2022 he did have a gallstone at that time.  Suspect cholelithiasis discovered today is likely been there for a while.  Also considered acute cholecystitis versus symptomatic choledocholithiasis but unlikely given non focal tenderness, normal LFTs, lipase and white blood cell count.  Advised however that he should follow-up with general surgery.  Fluid challenge without complication.  Discussed return precautions.  Chest pain workup was overall reassuring.  Suspect the pain he was experiencing in his chest was more GI related.  Discharged condition.  Patient management required discussion with the following services or consulting groups:  None  Complexity of Problems Addressed Acute complicated illness or Injury  Additional Data Reviewed and Analyzed Further history obtained from: Further history from spouse/family member, Past medical history and medications listed in the EMR, and Prior ED visit notes  Patient Encounter Risk Assessment Consideration of hospitalization      Final diagnoses:  Generalized abdominal pain    ED Discharge Orders     None          Janalee Mcmurray, PA-C 07/26/23 Gerline Kohl, MD 07/28/23 1236

## 2023-07-26 NOTE — ED Triage Notes (Signed)
 Pt reports nausea, vomiting, loose stools, smell to the stool. Abdominal pain x 1 week.

## 2023-07-26 NOTE — ED Triage Notes (Signed)
 Pt arrived via POV from Urgent Care for further evaluation of abdominal pain and emesis. Pt also endorses burning abdominal pain. Per family, Urgent Care administered ODT Zofran  prior to sending the Pt to the ER.

## 2023-07-26 NOTE — Discharge Instructions (Addendum)
 Evaluation today was overall reassuring.  Your CT scan did show that you do have a gallstone.  Of note there was a gallstone noted on CT 3 years ago.  Suspect this is chronic but do feel you should follow-up with general surgery.  If you have worsening abdominal pain, cannot tolerate fluid intake, persistent nausea vomiting, develop a fever or any other concerning symptom please return to the ED for further evaluation.  I sent Zofran  to your pharmacy to help with nausea and vomiting.  Please continue working on assertive hydration at home.

## 2023-07-27 ENCOUNTER — Other Ambulatory Visit: Payer: Self-pay | Admitting: Family Medicine

## 2023-07-28 ENCOUNTER — Other Ambulatory Visit: Payer: Self-pay

## 2023-07-28 MED ORDER — SUCRALFATE 1 G PO TABS: 1.0000 g | ORAL_TABLET | Freq: Three times a day (TID) | ORAL | 0 refills | Status: DC

## 2023-08-10 ENCOUNTER — Ambulatory Visit: Admitting: General Surgery

## 2023-08-14 DIAGNOSIS — M62830 Muscle spasm of back: Secondary | ICD-10-CM | POA: Diagnosis not present

## 2023-08-14 DIAGNOSIS — Z79899 Other long term (current) drug therapy: Secondary | ICD-10-CM | POA: Diagnosis not present

## 2023-08-14 DIAGNOSIS — G894 Chronic pain syndrome: Secondary | ICD-10-CM | POA: Diagnosis not present

## 2023-08-14 DIAGNOSIS — M5459 Other low back pain: Secondary | ICD-10-CM | POA: Diagnosis not present

## 2023-08-23 ENCOUNTER — Other Ambulatory Visit: Payer: Self-pay | Admitting: Family Medicine

## 2023-08-31 ENCOUNTER — Ambulatory Visit: Payer: Self-pay

## 2023-08-31 NOTE — Telephone Encounter (Signed)
 FYI Only or Action Required?: FYI only for provider.  Patient was last seen in primary care on 03/28/2023 by Cook, Jayce G, DO.  Called Nurse Triage reporting Rectal Pain.  Symptoms began about a month ago.  Interventions attempted: OTC medications: hemorrhoid cream with minimal relief.  Symptoms are: unchanged.  Triage Disposition: See Physician Within 24 Hours  Patient/caregiver understands and will follow disposition?: Yes     Copied from CRM #[id number]. Topic: Clinical - Red Word Triage >> Aug 31, 2023 11:29 AM Sasha H wrote: Red Word that prompted transfer to Nurse Triage: Pt is having pain when he goes to the restroom 8/10 . [place] his CNA is on the line Reason for Disposition  MODERATE-SEVERE rectal pain (i.e., interferes with school, work, or sleep)  Answer Assessment - Initial Assessment Questions 1. SYMPTOM:  What's the main symptom you're concerned about? (e.g., pain, itching, swelling, rash)     pain 2. ONSET: When did the sx  start?     X 1 month 3. RECTAL PAIN: Do you have any pain around your rectum? How bad is the pain?  (Scale 0-10; or none, mild, moderate, severe)     8/10 4. RECTAL ITCHING: Do you have any itching in this area? How bad is the itching?  (Scale 0-10; or none, mild, moderate, severe)     N/a 5. CONSTIPATION: Do you have constipation? If Yes, ask: How often do you have a bowel movement (BM)?  (Normal range: 3 times a day to every 3 days)  When was your last BM?       Last BM 10 minutes ago - endorses taking Miralax and stool softeners 6. CAUSE: What do you think is causing the anus symptoms?     Possible hemorrhoids 7. OTHER SYMPTOMS: Do you have any other symptoms?  (e.g., abdomen pain, fever, rectal bleeding, vomiting)     Endorses spots of blood when wiping Hurts to sit down - endorses using OTC cream with minimal relief 8. PREGNANCY: Is there any chance you are pregnant? When was your last menstrual period?      N/a  Protocols used: Rectal Symptoms-A-AH

## 2023-09-01 ENCOUNTER — Encounter: Payer: Self-pay | Admitting: Nurse Practitioner

## 2023-09-01 ENCOUNTER — Ambulatory Visit: Admitting: Nurse Practitioner

## 2023-09-01 VITALS — BP 135/76 | HR 84 | Temp 98.5°F | Ht 68.0 in | Wt 198.0 lb

## 2023-09-01 DIAGNOSIS — K644 Residual hemorrhoidal skin tags: Secondary | ICD-10-CM | POA: Diagnosis not present

## 2023-09-01 DIAGNOSIS — I10 Essential (primary) hypertension: Secondary | ICD-10-CM | POA: Diagnosis not present

## 2023-09-01 MED ORDER — HYDROCORTISONE ACETATE 25 MG RE SUPP: 25.0000 mg | Freq: Two times a day (BID) | RECTAL | 0 refills | Status: DC

## 2023-09-04 ENCOUNTER — Encounter: Payer: Self-pay | Admitting: Nurse Practitioner

## 2023-09-04 NOTE — Progress Notes (Signed)
   Subjective:    Patient ID: Korbin Mapps, male    DOB: 05/13/1955, 68 y.o.   MRN: [id number]  HPI Presents for complaints of possible hemorrhoid.  Has had a few spots of blood with wiping.  Tenderness in the rectal area.  Has tried Tucks pad and Preparation H with slight improvement.  Stools are mostly soft, takes MiraLAX for constipation.  Has an occasional hard stool.  No family history of colon cancer.  Patient defers colonoscopy.  Also patient needs a new blood pressure cuff to monitor his BP at home.   Review of Systems  Constitutional:  Negative for fever.  Respiratory:  Negative for cough, chest tightness and shortness of breath.   Cardiovascular:  Negative for chest pain.  Gastrointestinal:  Positive for anal bleeding. Negative for abdominal pain, blood in stool, constipation, diarrhea, nausea and vomiting.   Social History   Tobacco Use   Smoking status: Every Day    Current packs/day: 0.25    Average packs/day: 0.3 packs/day for 50.0 years (12.5 ttl pk-yrs)    Types: E-cigarettes, Cigarettes   Smokeless tobacco: Never  Vaping Use   Vaping status: Some Days  Substance Use Topics   Alcohol  use: No   Drug use: No       Objective:   Physical Exam Vitals and nursing note reviewed.  Constitutional:      General: He is not in acute distress. Cardiovascular:     Rate and Rhythm: Normal rate and regular rhythm.  Pulmonary:     Effort: Pulmonary effort is normal.     Breath sounds: Normal breath sounds.  Genitourinary:    Comments: Small firm external hemorrhoid noted.  No erythema or signs of thrombosis.  Moderately tender to palpation. Neurological:     Mental Status: He is alert.  Psychiatric:        Mood and Affect: Mood normal.        Behavior: Behavior normal.        Thought Content: Thought content normal.        Judgment: Judgment normal.    Today's Vitals   09/01/23 1009  BP: 135/76  Pulse: 84  Temp: 98.5 F (36.9 C)  SpO2: 97%  Weight: 198  lb (89.8 kg)  Height: 5' 8 (1.727 m)   Body mass index is 30.11 kg/m.         Assessment & Plan:   Problem List Items Addressed This Visit       Cardiovascular and Mediastinum   Essential hypertension   Other Visit Diagnoses       External hemorrhoid    -  Primary      Meds ordered this encounter  Medications   hydrocortisone  (ANUSOL -HC) 25 MG suppository    Sig: Place 1 suppository (25 mg total) rectally 2 (two) times daily.    Dispense:  12 suppository    Refill:  0    Supervising Provider:   ALPHONSA HAMILTON A [id number]   Patient most likely has internal hemorrhoids as well.  Start hydrocortisone  suppository as directed as needed hemorrhoids.  Continue MiraLAX as directed to keep stools soft and avoid constipation.  Patient adamantly refuses colonoscopy. Prescription given for a new blood pressure cuff, monitor BP at home and bring to next visit. Return for follow up with Dr. Bluford on 8/18 as planned. Call back sooner if needed.

## 2023-09-11 ENCOUNTER — Other Ambulatory Visit: Payer: Self-pay | Admitting: Family Medicine

## 2023-09-14 ENCOUNTER — Ambulatory Visit: Admitting: General Surgery

## 2023-09-14 ENCOUNTER — Encounter: Payer: Self-pay | Admitting: General Surgery

## 2023-09-14 VITALS — BP 152/78 | HR 69 | Temp 97.6°F | Resp 18 | Ht 68.0 in | Wt 199.0 lb

## 2023-09-14 DIAGNOSIS — K8020 Calculus of gallbladder without cholecystitis without obstruction: Secondary | ICD-10-CM

## 2023-09-15 NOTE — Progress Notes (Signed)
 Adrian Bird; [id number]; 1955/02/15   HPI Patient is a 68 year old white male who was referred to my care by the emergency room for evaluation and treatment of cholelithiasis.  Patient has a known history of cholelithiasis.  He was seen in the emergency room due to worsening heartburn secondary to GERD, which he has been treated for.  He has not seen a GI doctor.  He denies any right upper quadrant abdominal pain, fatty food intolerance, right flank pain, fever, chills, or jaundice. Past Medical History:  Diagnosis Date   Acid reflux    Asthma    Depression    Epilepsy (HCC)    High blood pressure    Urticaria     Past Surgical History:  Procedure Laterality Date   BIOPSY  01/27/2022   Procedure: BIOPSY;  Surgeon: Eartha Flavors, Toribio, MD;  Location: [facility];  Service: Gastroenterology;;   CYST REMOVAL NECK     CYST REMOVAL TRUNK     ESOPHAGOGASTRODUODENOSCOPY (EGD) WITH PROPOFOL  N/A 01/27/2022   Procedure: ESOPHAGOGASTRODUODENOSCOPY (EGD) WITH PROPOFOL ;  Surgeon: Eartha Flavors Toribio, MD;  Location: [facility];  Service: Gastroenterology;  Laterality: N/A;  1:30 pm, pt knows to arrive at 8:00   EYE SURGERY Bilateral    HERNIA REPAIR     KNEE SURGERY Left    TONSILLECTOMY      Family History  Problem Relation Age of Onset   Congestive Heart Failure Mother    Lung cancer Father    Diabetes Sister    Diabetes Brother    Diabetes Maternal Aunt    Lung cancer Maternal Grandfather     Current Outpatient Medications on File Prior to Visit  Medication Sig Dispense Refill   albuterol  (VENTOLIN  HFA) 108 (90 Base) MCG/ACT inhaler inhale 2 puffs into the lungs every 6 (six) hours as needed. for wheezing 1 each 3   atorvastatin  (LIPITOR) 80 MG tablet TAKE 1 TABLET BY MOUTH AT BEDTIME. 90 tablet 3   busPIRone  (BUSPAR ) 5 MG tablet Take 1 tablet (5 mg total) by mouth 2 (two) times daily as needed. 180 tablet 1   carbamazepine  (TEGRETOL ) 200 MG tablet Take 1  tablet (200 mg total) by mouth 3 (three) times daily. 270 tablet 3   diltiazem  (CARDIZEM  CD) 120 MG 24 hr capsule Take 1 capsule (120 mg total) by mouth daily. 90 capsule 3   ezetimibe  (ZETIA ) 10 MG tablet TAKE 1 TABLET BY MOUTH AT BEDTIME. 90 tablet 3   famotidine  (PEPCID ) 20 MG tablet TAKE (1) TABLET TWICE DAILY. 180 tablet 3   FLUoxetine  (PROZAC ) 10 MG capsule Take 1 capsule (10 mg total) by mouth daily. 90 capsule 3   fluticasone  (FLONASE ) 50 MCG/ACT nasal spray SHAKE LIQUID AND USE 1 SPRAY IN EACH NOSTRIL IN THE MORNING AND AT BEDTIME 16 g 0   gabapentin  (NEURONTIN ) 300 MG capsule take 1 capsule by mouth twice daily. 180 capsule 3   glucose blood (ACCU-CHEK GUIDE TEST) test strip use to check blood sugar 3 times daily. 100 strip 3   hydrocortisone  (ANUSOL -HC) 25 MG suppository Place 1 suppository (25 mg total) rectally 2 (two) times daily. 12 suppository 0   levETIRAcetam  (KEPPRA ) 750 MG tablet Take (1) tablet twice daily. 180 tablet 3   linaclotide  (LINZESS ) 290 MCG CAPS capsule Take 1 capsule (290 mcg total) by mouth every morning. 90 capsule 1   lisinopril  (ZESTRIL ) 20 MG tablet Take 1 tablet (20 mg total) by mouth every morning. 90 tablet 3  montelukast  (SINGULAIR ) 10 MG tablet Take 1 tablet (10 mg total) by mouth at bedtime. 30 tablet 3   Multiple Vitamin (MULTIVITAMIN WITH MINERALS) TABS tablet Take 1 tablet by mouth daily.     ondansetron  (ZOFRAN ) 4 MG tablet Take 1 tablet (4 mg total) by mouth every 6 (six) hours. 12 tablet 0   ondansetron  (ZOFRAN -ODT) 4 MG disintegrating tablet Take 1 tablet (4 mg total) by mouth every 8 (eight) hours as needed. 20 tablet 0   Oxycodone HCl 10 MG TABS Take 10 mg by mouth in the morning, at noon, in the evening, and at bedtime.     pantoprazole  (PROTONIX ) 40 MG tablet Take 1 tablet (40 mg total) by mouth 2 (two) times daily before a meal. 180 tablet 3   sucralfate  (CARAFATE ) 1 g tablet Take 1 tablet (1 g total) by mouth with breakfast, with lunch, and  with evening meal. 270 tablet 0   sucralfate  (CARAFATE ) 1 GM/10ML suspension take 10mls (1 gram total) by mouth 4 times daily with meals and at bedtime. 473 mL 0   tamsulosin  (FLOMAX ) 0.4 MG CAPS capsule Take 1 capsule (0.4 mg total) by mouth daily. 90 capsule 3   traZODone  (DESYREL ) 50 MG tablet take 1 tablet (50 MILLIGRAM total) by mouth at bedtime. 90 tablet 0   triamcinolone  cream (KENALOG ) 0.1 % Apply 1 Application topically 2 (two) times daily. 80 g 0   umeclidinium-vilanterol (ANORO ELLIPTA ) 62.5-25 MCG/ACT AEPB use (1) puff daily as directed. 1 each 3   No current facility-administered medications on file prior to visit.    Allergies  Allergen Reactions   Cephalexin Hives   Doxycycline Hives and Swelling   Penicillins Hives   Sulfa Antibiotics Hives and Rash    Social History   Substance and Sexual Activity  Alcohol  Use No    Social History   Tobacco Use  Smoking Status Every Day   Current packs/day: 0.25   Average packs/day: 0.3 packs/day for 50.0 years (12.5 ttl pk-yrs)   Types: E-cigarettes, Cigarettes  Smokeless Tobacco Never    Review of Systems  Constitutional:  Positive for malaise/fatigue.  HENT:  Positive for sinus pain.   Eyes: Negative.   Respiratory: Negative.    Cardiovascular:  Positive for chest pain.  Gastrointestinal:  Positive for heartburn.  Musculoskeletal:  Positive for back pain and joint pain.  Neurological:  Positive for dizziness, sensory change and headaches.  Psychiatric/Behavioral: Negative.      Objective   Vitals:   09/14/23 1107  BP: (!) 152/78  Pulse: 69  Resp: 18  Temp: 97.6 F (36.4 C)  SpO2: 94%    Physical Exam Vitals reviewed.  Constitutional:      Appearance: Normal appearance. He is normal weight. He is not ill-appearing.  HENT:     Head: Normocephalic and atraumatic.  Eyes:     General: No scleral icterus. Cardiovascular:     Rate and Rhythm: Normal rate and regular rhythm.     Heart sounds: Normal  heart sounds. No murmur heard.    No friction rub. No gallop.  Pulmonary:     Effort: Pulmonary effort is normal. No respiratory distress.     Breath sounds: Normal breath sounds. No stridor. No wheezing, rhonchi or rales.  Abdominal:     General: Bowel sounds are normal. There is no distension.     Palpations: Abdomen is soft. There is no mass.     Tenderness: There is no abdominal tenderness. There is no guarding  or rebound.     Hernia: No hernia is present.  Skin:    General: Skin is warm and dry.  Neurological:     Mental Status: He is alert and oriented to person, place, and time.   ER notes reviewed CT scan report reviewed LFTs within normal limits Assessment  Cholelithiasis, currently asymptomatic Plan  I told the patient that there was no need to remove his gallbladder at the present time.  He understands and agrees.  Literature was given concerning biliary colic and cholelithiasis.  Follow-up as needed.

## 2023-09-25 ENCOUNTER — Ambulatory Visit: Payer: 59 | Admitting: Family Medicine

## 2023-09-26 DIAGNOSIS — G4733 Obstructive sleep apnea (adult) (pediatric): Secondary | ICD-10-CM | POA: Diagnosis not present

## 2023-10-02 DIAGNOSIS — M62830 Muscle spasm of back: Secondary | ICD-10-CM | POA: Diagnosis not present

## 2023-10-02 DIAGNOSIS — M5459 Other low back pain: Secondary | ICD-10-CM | POA: Diagnosis not present

## 2023-10-02 DIAGNOSIS — Z79899 Other long term (current) drug therapy: Secondary | ICD-10-CM | POA: Diagnosis not present

## 2023-10-02 DIAGNOSIS — Z79891 Long term (current) use of opiate analgesic: Secondary | ICD-10-CM | POA: Diagnosis not present

## 2023-10-02 DIAGNOSIS — G894 Chronic pain syndrome: Secondary | ICD-10-CM | POA: Diagnosis not present

## 2023-10-03 ENCOUNTER — Ambulatory Visit (HOSPITAL_COMMUNITY)
Admission: RE | Admit: 2023-10-03 | Discharge: 2023-10-03 | Disposition: A | Discharge status: Home / Self Care | Source: Ambulatory Visit | Attending: Nurse Practitioner | Admitting: Nurse Practitioner

## 2023-10-03 ENCOUNTER — Ambulatory Visit (INDEPENDENT_AMBULATORY_CARE_PROVIDER_SITE_OTHER): Admitting: Family Medicine

## 2023-10-03 ENCOUNTER — Other Ambulatory Visit (HOSPITAL_COMMUNITY): Payer: Self-pay | Admitting: Nurse Practitioner

## 2023-10-03 VITALS — BP 138/76 | HR 78 | Temp 97.8°F | Ht 68.0 in | Wt 198.0 lb

## 2023-10-03 DIAGNOSIS — R079 Chest pain, unspecified: Secondary | ICD-10-CM | POA: Diagnosis not present

## 2023-10-03 DIAGNOSIS — Z1211 Encounter for screening for malignant neoplasm of colon: Secondary | ICD-10-CM | POA: Diagnosis not present

## 2023-10-03 DIAGNOSIS — K6289 Other specified diseases of anus and rectum: Secondary | ICD-10-CM | POA: Insufficient documentation

## 2023-10-03 DIAGNOSIS — M5459 Other low back pain: Secondary | ICD-10-CM | POA: Diagnosis not present

## 2023-10-03 DIAGNOSIS — M5126 Other intervertebral disc displacement, lumbar region: Secondary | ICD-10-CM | POA: Diagnosis not present

## 2023-10-03 DIAGNOSIS — I700 Atherosclerosis of aorta: Secondary | ICD-10-CM | POA: Diagnosis not present

## 2023-10-03 DIAGNOSIS — M47816 Spondylosis without myelopathy or radiculopathy, lumbar region: Secondary | ICD-10-CM | POA: Diagnosis not present

## 2023-10-03 MED ORDER — HYDROCORTISONE (PERIANAL) 2.5 % EX CREA: 1.0000 | TOPICAL_CREAM | Freq: Two times a day (BID) | CUTANEOUS | 0 refills | Status: DC

## 2023-10-03 NOTE — Patient Instructions (Addendum)
 Tylenol and heat. No evidence that this is cardiac.  Medication as needed for your bottom.  Follow up in 3-6 months.

## 2023-10-03 NOTE — Progress Notes (Signed)
 Subjective:  Patient ID: Adrian Bird, male    DOB: Jan 31, 1956  Age: 69 y.o. MRN: [id number]  CC:   Chief Complaint  Patient presents with   6 month follow up    Chest Pain    For 4 days on and off  Recent hemerrhoids taking stool softnener and miralx    HPI:  68 year old male presents for evaluation of the above.  Recently seen for hemorrhoid.  States that he is still having some discomfort. States that suppositories are difficult to insert.  Has never had colonoscopy. Will discuss today.  Patient also reports left sided chest pain intermittently for the past 4 days. Has a focal area of pain/tenderness.  No other associated symptoms.   Patient Active Problem List   Diagnosis Date Noted   Chest pain 10/03/2023   Rectal pain 10/03/2023   Insomnia 09/26/2022   Gastroesophageal reflux disease 11/03/2021   History of epilepsy 07/26/2021   OSA on CPAP 12/15/2020   Tobacco abuse 12/15/2020   COPD with chronic bronchitis and emphysema (HCC) 12/15/2020   Essential tremor 10/15/2020   Anxiety disorder 09/16/2019   Chronic low back pain 09/16/2019   Long-term current use of opiate analgesic 09/16/2019   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/16/2019   Essential hypertension 08/20/2014   Arthritis 10/04/2012   Mixed hyperlipidemia 10/04/2012    Social Hx   Social History   Socioeconomic History   Marital status: Married    Spouse name: Not on file   Number of children: Not on file   Years of education: Not on file   Highest education level: Not on file  Occupational History   Not on file  Tobacco Use   Smoking status: Every Day    Current packs/day: 0.25    Average packs/day: 0.3 packs/day for 50.0 years (12.5 ttl pk-yrs)    Types: E-cigarettes, Cigarettes   Smokeless tobacco: Never  Vaping Use   Vaping status: Some Days  Substance and Sexual Activity   Alcohol  use: No   Drug use: No   Sexual activity: Yes  Other Topics Concern   Not on file   Social History Narrative   Not on file   Social Drivers of Health   Financial Resource Strain: Low Risk  (06/23/2023)   Overall Financial Resource Strain (CARDIA)    Difficulty of Paying Living Expenses: Not hard at all  Food Insecurity: No Food Insecurity (06/23/2023)   Hunger Vital Sign    Worried About Running Out of Food in the Last Year: Never true    Ran Out of Food in the Last Year: Never true  Transportation Needs: No Transportation Needs (06/23/2023)   PRAPARE - Administrator, Civil Service (Medical): No    Lack of Transportation (Non-Medical): No  Physical Activity: Inactive (06/23/2023)   Exercise Vital Sign    Days of Exercise per Week: 0 days    Minutes of Exercise per Session: 0 min  Stress: No Stress Concern Present (06/23/2023)   [employer] of Occupational Health - Occupational Stress Questionnaire    Feeling of Stress : Not at all  Social Connections: Socially Isolated (06/23/2023)   Social Connection and Isolation Panel    Frequency of Communication with Friends and Family: Three times a week    Frequency of Social Gatherings with Friends and Family: Once a week    Attends Religious Services: Never    Database administrator or Organizations: No    Attends  Banker Meetings: Never    Marital Status: Never married    Review of Systems Per HPI  Objective:  BP 138/76   Pulse 78   Temp 97.8 F (36.6 C)   Ht 5' 8 (1.727 m)   Wt 198 lb (89.8 kg)   SpO2 97%   BMI 30.11 kg/m      10/03/2023   10:51 AM 10/03/2023   10:49 AM 09/14/2023   11:07 AM  BP/Weight  Systolic BP 138 144 152  Diastolic BP 76 78 78  Wt. (Lbs)  198 199  BMI  30.11 kg/m2 30.26 kg/m2    Physical Exam Vitals and nursing note reviewed.  Constitutional:      General: He is not in acute distress.    Appearance: Normal appearance.  Cardiovascular:     Rate and Rhythm: Normal rate and regular rhythm.  Pulmonary:     Effort: Pulmonary effort is normal.      Breath sounds: Normal breath sounds.  Chest:     Chest wall: Tenderness present.  Abdominal:     Palpations: Abdomen is soft.     Tenderness: There is no abdominal tenderness.  Genitourinary:    Comments: No appreciable hemorrhoid noted today. Neurological:     Mental Status: He is alert.     Lab Results  Component Value Date   WBC 8.4 07/26/2023   HGB 11.9 (L) 07/26/2023   HCT 37.2 (L) 07/26/2023   PLT 349 07/26/2023   GLUCOSE 98 07/26/2023   CHOL 126 09/26/2022   TRIG 75 09/26/2022   HDL 59 09/26/2022   LDLCALC 52 09/26/2022   ALT 23 07/26/2023   AST 20 07/26/2023   NA 135 07/26/2023   K 4.5 07/26/2023   CL 100 07/26/2023   CREATININE 0.67 07/26/2023   BUN 8 07/26/2023   CO2 26 07/26/2023   HGBA1C 5.9 (H) 07/26/2021     Assessment & Plan:  Chest pain, unspecified type Assessment & Plan: EKG - Normal sinus rhythm. No ST or T wave changes. Chest wall tender to palpation. This is MSK in origin. Tylenol as needed.  Orders: -     EKG 12-Lead  Encounter for screening colonoscopy -     Ambulatory referral to Gastroenterology  Rectal pain Assessment & Plan: Anusol  as directed.   Orders: -     Hydrocortisone  (Perianal); Place 1 Application rectally 2 (two) times daily.  Dispense: 30 g; Refill: 0    Follow-up:  3-6 months  Saphyre Cillo Bluford DO [facility]

## 2023-10-03 NOTE — Assessment & Plan Note (Signed)
 EKG - Normal sinus rhythm. No ST or T wave changes. Chest wall tender to palpation. This is MSK in origin. Tylenol as needed.

## 2023-10-03 NOTE — Assessment & Plan Note (Signed)
Anusol as directed. 

## 2023-10-19 ENCOUNTER — Other Ambulatory Visit: Payer: Self-pay | Admitting: Family Medicine

## 2023-11-15 ENCOUNTER — Other Ambulatory Visit: Payer: Self-pay | Admitting: Family Medicine

## 2023-11-17 ENCOUNTER — Ambulatory Visit
Admission: EM | Admit: 2023-11-17 | Discharge: 2023-11-17 | Disposition: A | Discharge status: Home / Self Care | Attending: Nurse Practitioner | Admitting: Nurse Practitioner

## 2023-11-17 ENCOUNTER — Encounter: Payer: Self-pay | Admitting: Emergency Medicine

## 2023-11-17 ENCOUNTER — Other Ambulatory Visit: Payer: Self-pay

## 2023-11-17 DIAGNOSIS — J069 Acute upper respiratory infection, unspecified: Secondary | ICD-10-CM | POA: Diagnosis not present

## 2023-11-17 DIAGNOSIS — R059 Cough, unspecified: Secondary | ICD-10-CM

## 2023-11-17 LAB — POC COVID19/FLU A&B COMBO
Covid Antigen, POC: NEGATIVE
Influenza A Antigen, POC: NEGATIVE
Influenza B Antigen, POC: NEGATIVE

## 2023-11-17 MED ORDER — FLUTICASONE PROPIONATE 50 MCG/ACT NA SUSP: 2.0000 | Freq: Every day | NASAL | 0 refills | Status: AC

## 2023-11-17 MED ORDER — LIDOCAINE VISCOUS HCL 2 % MT SOLN: OROMUCOSAL | 0 refills | Status: DC

## 2023-11-17 MED ORDER — GUAIFENESIN 100 MG/5ML PO LIQD: 10.0000 mL | Freq: Four times a day (QID) | ORAL | 0 refills | Status: DC | PRN

## 2023-11-17 NOTE — ED Triage Notes (Signed)
 Pt reports cough, sore throat, generalized body aches/weakness,fatigue x2 days. Denies anything otc prior to UC.

## 2023-11-17 NOTE — ED Provider Notes (Signed)
 [facility]    CSN: [id number] Arrival date & time: 11/17/23  1539      History   Chief Complaint Chief Complaint  Patient presents with   Cough    HPI Journey Ratterman is a 68 y.o. male.   The history is provided by the patient.   Patient presents with a 2-day history of fatigue, body aches, nasal congestion, runny nose, sore throat, bilateral ear pressure, and cough.  He denies fever, chills, headache, ear drainage, difficulty breathing, chest pain, abdominal pain, nausea, vomiting, diarrhea, or rash.  Patient also endorses wheezing.  He denies any obvious close sick contacts.  So far, states he has not taken any medications for his symptoms.  Of note, patient with history of asthma and COPD.  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   Essential tremor 10/15/2020   Anxiety disorder 09/16/2019   Chronic low back pain 09/16/2019   Long-term current use of opiate analgesic 09/16/2019   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/16/2019   Essential hypertension 08/20/2014   Arthritis 10/04/2012   Mixed hyperlipidemia 10/04/2012    Past Surgical History:  Procedure Laterality Date   BIOPSY  01/27/2022   Procedure: BIOPSY;  Surgeon: Eartha Flavors, Toribio, MD;  Location: [facility];  Service: Gastroenterology;;   CYST REMOVAL NECK     CYST REMOVAL TRUNK     ESOPHAGOGASTRODUODENOSCOPY (EGD) WITH PROPOFOL  N/A 01/27/2022   Procedure: ESOPHAGOGASTRODUODENOSCOPY (EGD) WITH PROPOFOL ;  Surgeon: Eartha Flavors Toribio, MD;  Location: [facility];  Service: Gastroenterology;  Laterality: N/A;  1:30 pm, pt knows to arrive at 8:00   EYE SURGERY Bilateral    HERNIA REPAIR     KNEE SURGERY Left    TONSILLECTOMY         Home Medications    Prior to Admission medications   Medication Sig Start Date End Date Taking? Authorizing Provider  fluticasone  (FLONASE ) 50 MCG/ACT nasal spray Place 2 sprays into both nostrils daily. 11/17/23  Yes Leath-Warren, Etta PARAS, NP  guaiFENesin (ROBITUSSIN) 100 MG/5ML liquid Take 10 mLs by mouth every 6 (six) hours as needed for cough or to loosen phlegm. 11/17/23  Yes Leath-Warren, Etta PARAS, NP  lidocaine  (XYLOCAINE ) 2 % solution Gargle and spit 5 mL every 6 hours as needed for throat pain or discomfort. 11/17/23  Yes Leath-Warren, Etta PARAS, NP  albuterol  (VENTOLIN  HFA) 108 (90 Base) MCG/ACT inhaler inhale 2 puffs into the lungs every 6 (six) hours as needed. for wheezing 11/16/23   Cook, Jayce G, DO  atorvastatin  (LIPITOR) 80 MG tablet TAKE 1 TABLET BY MOUTH AT BEDTIME. 05/31/23   Cook, Jayce G, DO  busPIRone  (BUSPAR ) 5 MG tablet Take 1 tablet (5 mg total) by mouth 2 (two) times daily as needed. 05/31/23   Cook, Jayce G, DO  carbamazepine  (TEGRETOL ) 200 MG tablet Take 1 tablet (200 mg total) by mouth 3 (three) times daily. 05/31/23   Cook, Jayce G, DO  diltiazem  (CARDIZEM  CD) 120 MG  24 hr capsule Take 1 capsule (120 mg total) by mouth daily. 05/31/23   Cook, Jayce G, DO  ezetimibe  (ZETIA ) 10 MG tablet TAKE 1 TABLET BY MOUTH AT BEDTIME. 05/31/23   Cook, Jayce G, DO  famotidine  (PEPCID ) 20 MG tablet TAKE (1) TABLET TWICE DAILY. 05/31/23   Cook, Jayce G, DO  FLUoxetine  (PROZAC ) 10 MG capsule Take 1 capsule (10 mg total) by mouth daily. 05/31/23   Cook, Jayce G, DO  gabapentin  (NEURONTIN ) 300 MG capsule take 1 capsule by mouth twice daily. 06/01/23   Cook, Jayce G, DO  glucose blood (ACCU-CHEK GUIDE TEST) test strip use to test/check blood sugar 3 times daily. 11/16/23   Cook, Jayce G, DO  hydrocortisone   (ANUSOL -HC) 2.5 % rectal cream Place 1 Application rectally 2 (two) times daily. 10/03/23   Cook, Jayce G, DO  levETIRAcetam  (KEPPRA ) 750 MG tablet Take (1) tablet twice daily. 05/31/23   Cook, Jayce G, DO  linaclotide  (LINZESS ) 290 MCG CAPS capsule Take 1 capsule (290 mcg total) by mouth every morning. 05/15/23   Cook, Jayce G, DO  lisinopril  (ZESTRIL ) 20 MG tablet Take 1 tablet (20 mg total) by mouth every morning. 05/31/23   Cook, Jayce G, DO  montelukast  (SINGULAIR ) 10 MG tablet take 1 tablet (10 milligram total) by mouth at bedtime. 11/16/23   Cook, Jayce G, DO  Multiple Vitamin (MULTIVITAMIN WITH MINERALS) TABS tablet Take 1 tablet by mouth daily.    [provider]  ondansetron  (ZOFRAN -ODT) 4 MG disintegrating tablet Take 1 tablet (4 mg total) by mouth every 8 (eight) hours as needed. 07/06/23   Leath-Warren, Etta PARAS, NP  Oxycodone HCl 10 MG TABS Take 10 mg by mouth in the morning, at noon, in the evening, and at bedtime.    [provider]  pantoprazole  (PROTONIX ) 40 MG tablet Take 1 tablet (40 mg total) by mouth 2 (two) times daily before a meal. 05/31/23   Cook, Jayce G, DO  sucralfate  (CARAFATE ) 1 g tablet Take 1 tablet (1 g total) by mouth with breakfast, with lunch, and with evening meal. 07/28/23   Cook, Jayce G, DO  sucralfate  (CARAFATE ) 1 GM/10ML suspension TAKE (1 GRAM TOTAL) BY MOUTH 4 TIMES DAILY WITH MEALS AND AT BEDTIME. 11/16/23   Cook, Jayce G, DO  tamsulosin  (FLOMAX ) 0.4 MG CAPS capsule Take 1 capsule (0.4 mg total) by mouth daily. 06/01/23   Cook, Jayce G, DO  traZODone  (DESYREL ) 50 MG tablet take 1 tablet (50 milligram total) by mouth at bedtime. 10/19/23   Cook, Jayce G, DO  triamcinolone  cream (KENALOG ) 0.1 % Apply 1 Application topically 2 (two) times daily. 10/04/22   Leath-Warren, Etta PARAS, NP  umeclidinium-vilanterol (ANORO ELLIPTA ) 62.5-25 MCG/ACT AEPB use (1) puff daily as directed. 06/28/23   Cook, Jayce G, DO    Family History Family History   Problem Relation Age of Onset   Congestive Heart Failure Mother    Lung cancer Father    Diabetes Sister    Diabetes Brother    Diabetes Maternal Aunt    Lung cancer Maternal Grandfather     Social History Social History   Tobacco Use   Smoking status: Every Day    Current packs/day: 0.25    Average packs/day: 0.3 packs/day for 50.0 years (12.5 ttl pk-yrs)    Types: E-cigarettes, Cigarettes   Smokeless tobacco: Never  Vaping Use   Vaping status: Some Days  Substance Use Topics   Alcohol  use: No   Drug use:  No     Allergies   Cephalexin, Doxycycline, Penicillins, and Sulfa antibiotics   Review of Systems Review of Systems Per HPI  Physical Exam Triage Vital Signs ED Triage Vitals  Encounter Vitals Group     BP 11/17/23 1551 (!) 188/93     Girls Systolic BP Percentile --      Girls Diastolic BP Percentile --      Boys Systolic BP Percentile --      Boys Diastolic BP Percentile --      Pulse Rate 11/17/23 1551 73     Resp 11/17/23 1551 20     Temp 11/17/23 1551 98.5 F (36.9 C)     Temp Source 11/17/23 1551 Oral     SpO2 11/17/23 1551 95 %     Weight --      Height --      Head Circumference --      Peak Flow --      Pain Score 11/17/23 1553 8     Pain Loc --      Pain Education --      Exclude from Growth Chart --    No data found.  Updated Vital Signs BP (!) 188/93 (BP Location: Right Arm)   Pulse 73   Temp 98.5 F (36.9 C) (Oral)   Resp 20   SpO2 95%   Visual Acuity Right Eye Distance:   Left Eye Distance:   Bilateral Distance:    Right Eye Near:   Left Eye Near:    Bilateral Near:     Physical Exam Vitals and nursing note reviewed.  Constitutional:      General: He is not in acute distress.    Appearance: Normal appearance. He is well-developed.  HENT:     Head: Normocephalic and atraumatic.     Right Ear: Tympanic membrane, ear canal and external ear normal.     Left Ear: Tympanic membrane, ear canal and external ear normal.      Nose: Congestion present.     Right Turbinates: Enlarged and swollen.     Left Turbinates: Enlarged and swollen.     Right Sinus: No maxillary sinus tenderness or frontal sinus tenderness.     Left Sinus: No maxillary sinus tenderness or frontal sinus tenderness.     Mouth/Throat:     Lips: Pink.     Mouth: Mucous membranes are moist.     Pharynx: Uvula midline. Posterior oropharyngeal erythema and postnasal drip present. No pharyngeal swelling, oropharyngeal exudate or uvula swelling.     Comments: Cobblestoning present to posterior oropharynx  Eyes:     Extraocular Movements: Extraocular movements intact.     Conjunctiva/sclera: Conjunctivae normal.     Pupils: Pupils are equal, round, and reactive to light.  Neck:     Thyroid : No thyromegaly.     Trachea: No tracheal deviation.  Cardiovascular:     Rate and Rhythm: Normal rate and regular rhythm.     Pulses: Normal pulses.     Heart sounds: Normal heart sounds.  Pulmonary:     Effort: Pulmonary effort is normal. No respiratory distress.     Breath sounds: Normal breath sounds. No stridor. No wheezing, rhonchi or rales.  Abdominal:     General: Bowel sounds are normal.     Palpations: Abdomen is soft.     Tenderness: There is no abdominal tenderness.  Musculoskeletal:     Cervical back: Normal range of motion and neck supple.  Skin:  General: Skin is warm and dry.  Neurological:     General: No focal deficit present.     Mental Status: He is alert and oriented to person, place, and time.  Psychiatric:        Mood and Affect: Mood normal.        Behavior: Behavior normal.        Thought Content: Thought content normal.        Judgment: Judgment normal.      UC Treatments / Results  Labs (all labs ordered are listed, but only abnormal results are displayed) Labs Reviewed  POC COVID19/FLU A&B COMBO    EKG   Radiology No results found.  Procedures Procedures (including critical care time)  Medications  Ordered in UC Medications - No data to display  Initial Impression / Assessment and Plan / UC Course  I have reviewed the triage vital signs and the nursing notes.  Pertinent labs & imaging results that were available during my care of the patient were reviewed by me and considered in my medical decision making (see chart for details).  COVID/flu test was negative.  On exam, the patient is hypertensive, he is otherwise well-appearing, and is in no acute distress.  On exam, lung sounds are clear throughout, room air sats at 95%.  Symptoms consistent with viral URI with cough.  Will provide symptomatic treatment with guaifenesin 100 mg, fluticasone  50 mcg nasal spray, and viscous lidocaine  2% for patient to gargle and spit for throat pain or discomfort.  Supportive care recommendations were provided and discussed with the patient to include fluids, rest, over-the-counter Tylenol, normal saline nasal spray, and use of a humidifier during sleep.  Discussed indications with patient regarding follow-up.  Patient was in agreement with this plan of care and verbalizes understanding.  All questions were answered.  Patient stable for discharge. Will Final Clinical Impressions(s) / UC Diagnoses   Final diagnoses:  Cough, unspecified type  Viral URI with cough     Discharge Instructions      The COVID/flu test was negative. Take medication as prescribed.  You may use your albuterol  inhaler as needed for wheezing or shortness of breath. Increase fluids and allow for plenty of rest. You may take over-the-counter Tylenol as needed for pain, fever, or general discomfort. Recommend the use of normal saline nasal spray throughout the day for nasal congestion and runny nose. For your cough, recommend the use of a humidifier in your bedroom at nighttime during sleep and sleeping elevated on pillows while symptoms persist. Symptoms should begin to improve over the next 5 to 7 days.  If symptoms fail to  improve, or begin to worsen, you may follow-up in this clinic or with your primary care physician for further evaluation. Follow-up as needed.     ED Prescriptions     Medication Sig Dispense Auth. Provider   guaiFENesin (ROBITUSSIN) 100 MG/5ML liquid Take 10 mLs by mouth every 6 (six) hours as needed for cough or to loosen phlegm. 300 mL Leath-Warren, Etta PARAS, NP   fluticasone  (FLONASE ) 50 MCG/ACT nasal spray Place 2 sprays into both nostrils daily. 16 g Leath-Warren, Etta PARAS, NP   lidocaine  (XYLOCAINE ) 2 % solution Gargle and spit 5 mL every 6 hours as needed for throat pain or discomfort. 100 mL Leath-Warren, Etta PARAS, NP      PDMP not reviewed this encounter.   Gilmer Etta PARAS, NP 11/17/23 1625

## 2023-11-17 NOTE — Discharge Instructions (Addendum)
 The COVID/flu test was negative. Take medication as prescribed.  You may use your albuterol  inhaler as needed for wheezing or shortness of breath. Increase fluids and allow for plenty of rest. You may take over-the-counter Tylenol as needed for pain, fever, or general discomfort. Recommend the use of normal saline nasal spray throughout the day for nasal congestion and runny nose. For your cough, recommend the use of a humidifier in your bedroom at nighttime during sleep and sleeping elevated on pillows while symptoms persist. Symptoms should begin to improve over the next 5 to 7 days.  If symptoms fail to improve, or begin to worsen, you may follow-up in this clinic or with your primary care physician for further evaluation. Follow-up as needed.

## 2023-11-19 ENCOUNTER — Emergency Department (HOSPITAL_COMMUNITY)

## 2023-11-19 ENCOUNTER — Other Ambulatory Visit: Payer: Self-pay

## 2023-11-19 ENCOUNTER — Emergency Department (HOSPITAL_COMMUNITY)
Admission: EM | Admit: 2023-11-19 | Discharge: 2023-11-19 | Disposition: A | Discharge status: Home / Self Care | Attending: Emergency Medicine | Admitting: Emergency Medicine

## 2023-11-19 ENCOUNTER — Encounter (HOSPITAL_COMMUNITY): Payer: Self-pay

## 2023-11-19 DIAGNOSIS — I10 Essential (primary) hypertension: Secondary | ICD-10-CM | POA: Diagnosis not present

## 2023-11-19 DIAGNOSIS — J441 Chronic obstructive pulmonary disease with (acute) exacerbation: Secondary | ICD-10-CM | POA: Diagnosis not present

## 2023-11-19 DIAGNOSIS — Z7952 Long term (current) use of systemic steroids: Secondary | ICD-10-CM | POA: Diagnosis not present

## 2023-11-19 DIAGNOSIS — B9789 Other viral agents as the cause of diseases classified elsewhere: Secondary | ICD-10-CM | POA: Diagnosis not present

## 2023-11-19 DIAGNOSIS — Z79899 Other long term (current) drug therapy: Secondary | ICD-10-CM | POA: Diagnosis not present

## 2023-11-19 DIAGNOSIS — Z7951 Long term (current) use of inhaled steroids: Secondary | ICD-10-CM | POA: Diagnosis not present

## 2023-11-19 DIAGNOSIS — J069 Acute upper respiratory infection, unspecified: Secondary | ICD-10-CM | POA: Diagnosis not present

## 2023-11-19 DIAGNOSIS — R0602 Shortness of breath: Secondary | ICD-10-CM | POA: Diagnosis not present

## 2023-11-19 DIAGNOSIS — J449 Chronic obstructive pulmonary disease, unspecified: Secondary | ICD-10-CM | POA: Diagnosis not present

## 2023-11-19 DIAGNOSIS — Z720 Tobacco use: Secondary | ICD-10-CM | POA: Diagnosis not present

## 2023-11-19 DIAGNOSIS — R059 Cough, unspecified: Secondary | ICD-10-CM | POA: Diagnosis not present

## 2023-11-19 LAB — COMPREHENSIVE METABOLIC PANEL WITH GFR
ALT: 15 U/L (ref 0–44)
AST: 24 U/L (ref 15–41)
Albumin: 4.4 g/dL (ref 3.5–5.0)
Alkaline Phosphatase: 97 U/L (ref 38–126)
Anion gap: 11 (ref 5–15)
BUN: 7 mg/dL — ABNORMAL LOW (ref 8–23)
CO2: 28 mmol/L (ref 22–32)
Calcium: 9 mg/dL (ref 8.9–10.3)
Chloride: 97 mmol/L — ABNORMAL LOW (ref 98–111)
Creatinine, Ser: 0.7 mg/dL (ref 0.61–1.24)
GFR, Estimated: >60 mL/min (ref >60)
Glucose, Bld: 143 mg/dL — ABNORMAL HIGH (ref 70–99)
Potassium: 3.6 mmol/L (ref 3.5–5.1)
Sodium: 136 mmol/L (ref 135–145)
Total Bilirubin: <0.2 mg/dL (ref 0.0–1.2)
Total Protein: 7 g/dL (ref 6.5–8.1)

## 2023-11-19 LAB — CBC WITH DIFFERENTIAL/PLATELET
Abs Immature Granulocytes: 0.02 K/uL (ref 0.00–0.07)
Basophils Absolute: 0.1 K/uL (ref 0.0–0.1)
Basophils Relative: 1 %
Eosinophils Absolute: 0.1 K/uL (ref 0.0–0.5)
Eosinophils Relative: 2 %
HCT: 34.1 % — ABNORMAL LOW (ref 39.0–52.0)
Hemoglobin: 11 g/dL — ABNORMAL LOW (ref 13.0–17.0)
Immature Granulocytes: 0 %
Lymphocytes Relative: 23 %
Lymphs Abs: 1.3 K/uL (ref 0.7–4.0)
MCH: 26.3 pg (ref 26.0–34.0)
MCHC: 32.3 g/dL (ref 30.0–36.0)
MCV: 81.6 fL (ref 80.0–100.0)
Monocytes Absolute: 1.4 K/uL — ABNORMAL HIGH (ref 0.1–1.0)
Monocytes Relative: 25 %
Neutro Abs: 2.7 K/uL (ref 1.7–7.7)
Neutrophils Relative %: 49 %
Platelets: 308 K/uL (ref 150–400)
RBC: 4.18 MIL/uL — ABNORMAL LOW (ref 4.22–5.81)
RDW: 15.9 % — ABNORMAL HIGH (ref 11.5–15.5)
WBC: 5.6 K/uL (ref 4.0–10.5)
nRBC: 0 % (ref 0.0–0.2)

## 2023-11-19 LAB — RESP PANEL BY RT-PCR (RSV, FLU A&B, COVID)  RVPGX2
Influenza A by PCR: NEGATIVE
Influenza B by PCR: NEGATIVE
Resp Syncytial Virus by PCR: NEGATIVE
SARS Coronavirus 2 by RT PCR: NEGATIVE

## 2023-11-19 LAB — PRO BRAIN NATRIURETIC PEPTIDE: Pro Brain Natriuretic Peptide: <50 pg/mL (ref <300.0)

## 2023-11-19 LAB — TROPONIN T, HIGH SENSITIVITY: Troponin T High Sensitivity: <15 ng/L (ref 0–19)

## 2023-11-19 MED ORDER — METHYLPREDNISOLONE SODIUM SUCC 125 MG IJ SOLR
125.0000 mg | Freq: Once | INTRAMUSCULAR | Status: AC
Start: 2023-11-19 — End: 2023-11-19
  Administered 2023-11-19: 125 mg via INTRAVENOUS
  Filled 2023-11-19: qty 2

## 2023-11-19 MED ORDER — PREDNISONE 20 MG PO TABS: 40.0000 mg | ORAL_TABLET | Freq: Every day | ORAL | 0 refills | Status: DC

## 2023-11-19 MED ORDER — BENZONATATE 100 MG PO CAPS: 100.0000 mg | ORAL_CAPSULE | Freq: Three times a day (TID) | ORAL | 0 refills | Status: DC

## 2023-11-19 MED ORDER — ALBUTEROL SULFATE (2.5 MG/3ML) 0.083% IN NEBU
10.0000 mg | INHALATION_SOLUTION | RESPIRATORY_TRACT | Status: AC
  Administered 2023-11-19: 10 mg via RESPIRATORY_TRACT
  Filled 2023-11-19: qty 12

## 2023-11-19 MED ORDER — IPRATROPIUM-ALBUTEROL 0.5-2.5 (3) MG/3ML IN SOLN
3.0000 mL | Freq: Once | RESPIRATORY_TRACT | Status: AC
  Administered 2023-11-19: 3 mL via RESPIRATORY_TRACT
  Filled 2023-11-19: qty 3

## 2023-11-19 MED ORDER — ALBUTEROL SULFATE HFA 108 (90 BASE) MCG/ACT IN AERS: 2.0000 | INHALATION_SPRAY | RESPIRATORY_TRACT | 3 refills | Status: AC | PRN

## 2023-11-19 NOTE — ED Triage Notes (Signed)
 Pt brb EMS, c/o ShOB; hx of COPD. pt states he's used his inhaler twice without relief of symptoms. Onset about 6 days, pt went to urgent care 3 days; symptoms worsened today and pt called 911. Pt denies N/VD, headache; A&Ox4.

## 2023-11-19 NOTE — ED Provider Notes (Signed)
 [place] EMERGENCY DEPARTMENT AT [facility] Provider Note   CSN: [id number] Arrival date & time: 11/19/23  8367     Patient presents with: Shortness of Breath   Adrian Bird is a 68 y.o. male.    Shortness of Breath    This patient is a 68 year old male, he has a history of COPD, history of hypertension, history of seizure disorder, he states that for the last few days he has had some upper respiratory symptoms such as a sore throat bit of a runny nose and a cough, he went to the urgent care 2 days ago had some negative testing and was given some cough medication, he has not really got any better and today he was feeling more tight in his chest with shortness of breath and coughing.  He states that he vapes, he used to smoke, he has not had fevers or swelling of his legs and he is not orthopneic.  He arrives by EMS transport with normal vital signs  Prior to Admission medications   Medication Sig Start Date End Date Taking? Authorizing Provider  albuterol  (VENTOLIN  HFA) 108 (90 Base) MCG/ACT inhaler Inhale 2 puffs into the lungs every 4 (four) hours as needed for wheezing or shortness of breath. 11/19/23  Yes Cleotilde Rogue, MD  benzonatate  (TESSALON ) 100 MG capsule Take 1 capsule (100 mg total) by mouth every 8 (eight) hours. 11/19/23  Yes Cleotilde Rogue, MD  predniSONE  (DELTASONE ) 20 MG tablet Take 2 tablets (40 mg total) by mouth daily. 11/19/23  Yes Cleotilde Rogue, MD  atorvastatin  (LIPITOR) 80 MG tablet TAKE 1 TABLET BY MOUTH AT BEDTIME. 05/31/23   Cook, Jayce G, DO  busPIRone  (BUSPAR ) 5 MG tablet Take 1 tablet (5 mg total) by mouth 2 (two) times daily as needed. 05/31/23   Cook, Jayce G, DO  carbamazepine  (TEGRETOL ) 200 MG tablet Take 1 tablet (200 mg total) by mouth 3 (three) times daily. 05/31/23   Cook, Jayce G, DO  diltiazem  (CARDIZEM  CD) 120 MG 24 hr capsule Take 1 capsule (120 mg total) by mouth daily. 05/31/23   Cook, Jayce G, DO  ezetimibe  (ZETIA ) 10 MG  tablet TAKE 1 TABLET BY MOUTH AT BEDTIME. 05/31/23   Cook, Jayce G, DO  famotidine  (PEPCID ) 20 MG tablet TAKE (1) TABLET TWICE DAILY. 05/31/23   Cook, Jayce G, DO  FLUoxetine  (PROZAC ) 10 MG capsule Take 1 capsule (10 mg total) by mouth daily. 05/31/23   Cook, Jayce G, DO  fluticasone  (FLONASE ) 50 MCG/ACT nasal spray Place 2 sprays into both nostrils daily. 11/17/23   Leath-Warren, Etta PARAS, NP  gabapentin  (NEURONTIN ) 300 MG capsule take 1 capsule by mouth twice daily. 06/01/23   Cook, Jayce G, DO  glucose blood (ACCU-CHEK GUIDE TEST) test strip use to test/check blood sugar 3 times daily. 11/16/23   Cook, Jayce G, DO  guaiFENesin (ROBITUSSIN) 100 MG/5ML liquid Take 10 mLs by mouth every 6 (six) hours as needed for cough or to loosen phlegm. 11/17/23   Leath-Warren, Christie J, NP  hydrocortisone  (ANUSOL -HC) 2.5 % rectal cream Place 1 Application rectally 2 (two) times daily. 10/03/23   Cook, Jayce G, DO  levETIRAcetam  (KEPPRA ) 750 MG tablet Take (1) tablet twice daily. 05/31/23   Cook, Jayce G, DO  lidocaine  (XYLOCAINE ) 2 % solution Gargle and spit 5 mL every 6 hours as needed for throat pain or discomfort. 11/17/23   Leath-Warren, Etta PARAS, NP  linaclotide  (LINZESS ) 290 MCG CAPS capsule Take 1 capsule (290 mcg  total) by mouth every morning. 05/15/23   Cook, Jayce G, DO  lisinopril  (ZESTRIL ) 20 MG tablet Take 1 tablet (20 mg total) by mouth every morning. 05/31/23   Cook, Jayce G, DO  montelukast  (SINGULAIR ) 10 MG tablet take 1 tablet (10 milligram total) by mouth at bedtime. 11/16/23   Cook, Jayce G, DO  Multiple Vitamin (MULTIVITAMIN WITH MINERALS) TABS tablet Take 1 tablet by mouth daily.    [provider]  ondansetron  (ZOFRAN -ODT) 4 MG disintegrating tablet Take 1 tablet (4 mg total) by mouth every 8 (eight) hours as needed. 07/06/23   Leath-Warren, Etta PARAS, NP  Oxycodone HCl 10 MG TABS Take 10 mg by mouth in the morning, at noon, in the evening, and at bedtime.    [provider]   pantoprazole  (PROTONIX ) 40 MG tablet Take 1 tablet (40 mg total) by mouth 2 (two) times daily before a meal. 05/31/23   Cook, Jayce G, DO  sucralfate  (CARAFATE ) 1 g tablet Take 1 tablet (1 g total) by mouth with breakfast, with lunch, and with evening meal. 07/28/23   Cook, Jayce G, DO  sucralfate  (CARAFATE ) 1 GM/10ML suspension TAKE (1 GRAM TOTAL) BY MOUTH 4 TIMES DAILY WITH MEALS AND AT BEDTIME. 11/16/23   Cook, Jayce G, DO  tamsulosin  (FLOMAX ) 0.4 MG CAPS capsule Take 1 capsule (0.4 mg total) by mouth daily. 06/01/23   Cook, Jayce G, DO  traZODone  (DESYREL ) 50 MG tablet take 1 tablet (50 milligram total) by mouth at bedtime. 10/19/23   Cook, Jayce G, DO  triamcinolone  cream (KENALOG ) 0.1 % Apply 1 Application topically 2 (two) times daily. 10/04/22   Leath-Warren, Etta PARAS, NP  umeclidinium-vilanterol (ANORO ELLIPTA ) 62.5-25 MCG/ACT AEPB use (1) puff daily as directed. 06/28/23   Cook, Jayce G, DO    Allergies: Cephalexin, Doxycycline, Penicillins, and Sulfa antibiotics    Review of Systems  Respiratory:  Positive for shortness of breath.   All other systems reviewed and are negative.   Updated Vital Signs BP (!) 165/74   Pulse 96   Temp 98.3 F (36.8 C) (Oral)   Resp 19   Ht 1.727 m (5' 8)   Wt 88.9 kg   SpO2 100%   BMI 29.80 kg/m   Physical Exam Vitals and nursing note reviewed.  Constitutional:      General: He is not in acute distress.    Appearance: He is well-developed.  HENT:     Head: Normocephalic and atraumatic.     Mouth/Throat:     Pharynx: No oropharyngeal exudate.  Eyes:     General: No scleral icterus.       Right eye: No discharge.        Left eye: No discharge.     Conjunctiva/sclera: Conjunctivae normal.     Pupils: Pupils are equal, round, and reactive to light.  Neck:     Thyroid : No thyromegaly.     Vascular: No JVD.  Cardiovascular:     Rate and Rhythm: Normal rate and regular rhythm.     Heart sounds: Normal heart sounds. No murmur  heard.    No friction rub. No gallop.  Pulmonary:     Effort: Pulmonary effort is normal. No respiratory distress.     Breath sounds: Wheezing present. No rales.     Comments: Speaks in full sentences - has expiratory wheezing Abdominal:     General: Bowel sounds are normal. There is no distension.     Palpations: Abdomen is soft. There  is no mass.     Tenderness: There is no abdominal tenderness.  Musculoskeletal:        General: No tenderness. Normal range of motion.     Cervical back: Normal range of motion and neck supple.     Right lower leg: No edema.     Left lower leg: No edema.  Lymphadenopathy:     Cervical: No cervical adenopathy.  Skin:    General: Skin is warm and dry.     Findings: No erythema or rash.  Neurological:     Mental Status: He is alert.     Coordination: Coordination normal.  Psychiatric:        Behavior: Behavior normal.     (all labs ordered are listed, but only abnormal results are displayed) Labs Reviewed  COMPREHENSIVE METABOLIC PANEL WITH GFR - Abnormal; Notable for the following components:      Result Value   Chloride 97 (*)    Glucose, Bld 143 (*)    BUN 7 (*)    All other components within normal limits  CBC WITH DIFFERENTIAL/PLATELET - Abnormal; Notable for the following components:   RBC 4.18 (*)    Hemoglobin 11.0 (*)    HCT 34.1 (*)    RDW 15.9 (*)    Monocytes Absolute 1.4 (*)    All other components within normal limits  RESP PANEL BY RT-PCR (RSV, FLU A&B, COVID)  [id number]  PRO BRAIN NATRIURETIC PEPTIDE  TROPONIN T, HIGH SENSITIVITY    EKG: None  Radiology: [facility] Chest Port 1 View Result Date: 11/19/2023 CLINICAL DATA:  Cough, shortness of breath, COPD EXAM: PORTABLE CHEST 1 VIEW COMPARISON:  07/26/2023 FINDINGS: Mild hyperinflation. Heart and mediastinal contours are within normal limits. No focal opacities or effusions. No acute bony abnormality. IMPRESSION: Hyperinflation.  No active cardiopulmonary disease. Electronically  Signed   By: Franky Crease M.D.   On: 11/19/2023 18:03     Procedures   Medications Ordered in the ED  albuterol  (PROVENTIL ) (2.5 MG/3ML) 0.083% nebulizer solution 10 mg (0 mg Nebulization Stopped 11/19/23 1810)  methylPREDNISolone  sodium succinate (SOLU-MEDROL ) 125 mg/2 mL injection 125 mg (125 mg Intravenous Given 11/19/23 1712)  ipratropium-albuterol  (DUONEB) 0.5-2.5 (3) MG/3ML nebulizer solution 3 mL (3 mLs Nebulization Given 11/19/23 1701)                                    Medical Decision Making Amount and/or Complexity of Data Reviewed Labs: ordered. Radiology: ordered.  Risk Prescription drug management.    This patient presents to the ED for concern of wheezing and shortness of breath differential diagnosis includes upper respiratory illness, pneumonia, pneumothorax, COPD exacerbation    Additional history obtained   Additional history obtained from Electronic Medical Record External records from outside source obtained and reviewed including medical record, the patient was seen at urgent care 2 days ago, was seen in the office a couple of months ago because of gallstones and chest pain, has had multiple ED visits over time and urgent care visits for COPD exacerbations etc.  He is not on oxygen at home No prior stress test or heart catheterizations or echocardiograms   Lab Tests:  I Ordered, and personally interpreted labs.  The pertinent results include: CBC without leukocytosis, metabolic panel without renal dysfunction or electrolyte abnormalities, troponin and BNP both negative COVID testing negative flu testing negative   Imaging Studies ordered:  I ordered imaging  studies including chest x-ray I independently visualized and interpreted imaging which showed no acute findings other than hyperinflation consistent with history of emphysema I agree with the radiologist interpretation   Medicines ordered and prescription drug management:  I ordered  medication including methylprednisolone , DuoNeb, continuous nebulizer therapy I have reviewed the patients home medicines and have made adjustments as needed   Problem List / ED Course:  Patient significantly improved after meds, oxygenating at 100%, ambulated with a desat only to 95% without any respiratory distress, informed of his results and agreeable to follow-up in the outpatient setting, likely viral illness   Social Determinants of Health:  Lung disease from tobacco use        Final diagnoses:  Viral upper respiratory illness  COPD exacerbation [facility])    ED Discharge Orders          Ordered    predniSONE  (DELTASONE ) 20 MG tablet  Daily        11/19/23 1900    albuterol  (VENTOLIN  HFA) 108 (90 Base) MCG/ACT inhaler  Every 4 hours PRN        11/19/23 1900    benzonatate  (TESSALON ) 100 MG capsule  Every 8 hours        11/19/23 1900               Cleotilde Rogue, MD 11/19/23 1901

## 2023-11-19 NOTE — Discharge Instructions (Signed)
 I have prescribed prednisone , albuterol  and Tessalon , this should help you feel better over the next week but it will take several days.  Return for severe or worsening symptoms  Albuterol  is an inhaled medication which can help you to breathe better, you should take 2 puffs every 4 hours as needed, this may cause your heart to feel like it is racing, this should be a temporary side effect.   Prednisone  is a steroid that helps to reduce certain types of inflammation and may be used for allergic reactions, some rashes such as poison ivy or dermatitis, for asthma attacks or bronchitis and for certain types of pain.  Please take this medicine exactly as prescribed - 40mg  by mouth daily for 5 days.  This can have certain side effects with some people including feeling like you can't sleep, feeling anxious or feeling like you are on a high.  It should not cause weight gain if only taken for a short time.  Please be aware that this medication may also cause an elevation in your blood sugar if you are a diabetic so if you are a diabetic you will need to keep a very close eye on your blood sugar, make sure that you are eating an extremely low level of carbohydrates and taking your medications exactly as prescribed.  If you should develop severe high blood sugar or start to feel poorly return to the emergency department immediately   Tessalon  is a cough medication that helps reduce the amount of coughing that you are having.  You may take up to 200 mg every 8 hours as needed.  This can be used safely with most or other over-the-counter medications but talk to your pharmacist before taking anything else over-the-counter with it  Thank you for allowing us  to treat you in the emergency department today.  After reviewing your examination and potential testing that was done it appears that you are safe to go home.  I would like for you to follow-up with your doctor within the next several days, have them obtain your  records and follow-up with them to review all potential tests and results from your visit.  If you should develop severe or worsening symptoms return to the emergency department immediately

## 2023-11-19 NOTE — ED Notes (Signed)
 Provider in room at triage.

## 2023-11-22 ENCOUNTER — Encounter: Payer: Self-pay | Admitting: Family Medicine

## 2023-11-22 ENCOUNTER — Ambulatory Visit: Admitting: Family Medicine

## 2023-11-22 VITALS — BP 154/73 | HR 77 | Resp 18 | Ht 68.0 in | Wt 197.1 lb

## 2023-11-22 DIAGNOSIS — J441 Chronic obstructive pulmonary disease with (acute) exacerbation: Secondary | ICD-10-CM

## 2023-11-22 DIAGNOSIS — F331 Major depressive disorder, recurrent, moderate: Secondary | ICD-10-CM | POA: Diagnosis not present

## 2023-11-22 MED ORDER — AZITHROMYCIN 250 MG PO TABS: ORAL_TABLET | ORAL | 0 refills | Status: AC

## 2023-11-22 MED ORDER — FLUOXETINE HCL 20 MG PO CAPS: 20.0000 mg | ORAL_CAPSULE | Freq: Every day | ORAL | 3 refills | Status: AC

## 2023-11-22 NOTE — Assessment & Plan Note (Signed)
 Placing on antibiotic therapy.

## 2023-11-22 NOTE — Assessment & Plan Note (Addendum)
 Recent worsening.  Increasing fluoxetine .

## 2023-11-22 NOTE — Progress Notes (Signed)
 Subjective:  Patient ID: Adrian Bird, male    DOB: 03-05-1955  Age: 68 y.o. MRN: [id number]  CC:   Chief Complaint  Patient presents with   Follow-up    Seen in ED 10/12 for Uri, also seen at [facility] on 10/10 for URI. Reports still having productive cough with green mucus and sinus congestion     HPI:  68 year old male presents for evaluation of the above.  Patient has had ongoing respiratory symptoms.  He has been treated and is not improving.  He reports significant sinus pressure and congestion.  Reports productive cough.  Has known COPD.  Patient also reports that he is not feeling like his normal self.  He states that he is feeling more depressed.  He states that he is not sure why.  Patient Active Problem List   Diagnosis Date Noted   COPD exacerbation (HCC) 11/22/2023   Insomnia 09/26/2022   Gastroesophageal reflux disease 11/03/2021   History of epilepsy 07/26/2021   OSA on CPAP 12/15/2020   Tobacco abuse 12/15/2020   COPD with chronic bronchitis and emphysema (HCC) 12/15/2020   Essential tremor 10/15/2020   Anxiety disorder 09/16/2019   Chronic low back pain 09/16/2019   Long-term current use of opiate analgesic 09/16/2019   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/16/2019   Essential hypertension 08/20/2014   Arthritis 10/04/2012   Mixed hyperlipidemia 10/04/2012    Social Hx   Social History   Socioeconomic History   Marital status: Married    Spouse name: Not on file   Number of children: Not on file   Years of education: Not on file   Highest education level: Not on file  Occupational History   Not on file  Tobacco Use   Smoking status: Every Day    Current packs/day: 0.25    Average packs/day: 0.3 packs/day for 50.0 years (12.5 ttl pk-yrs)    Types: E-cigarettes, Cigarettes   Smokeless tobacco: Never  Vaping Use   Vaping status: Some Days  Substance and Sexual Activity   Alcohol  use: No   Drug use: No   Sexual activity: Yes  Other  Topics Concern   Not on file  Social History Narrative   Not on file   Social Drivers of Health   Financial Resource Strain: Low Risk  (06/23/2023)   Overall Financial Resource Strain (CARDIA)    Difficulty of Paying Living Expenses: Not hard at all  Food Insecurity: No Food Insecurity (06/23/2023)   Hunger Vital Sign    Worried About Running Out of Food in the Last Year: Never true    Ran Out of Food in the Last Year: Never true  Transportation Needs: No Transportation Needs (06/23/2023)   PRAPARE - Administrator, Civil Service (Medical): No    Lack of Transportation (Non-Medical): No  Physical Activity: Inactive (06/23/2023)   Exercise Vital Sign    Days of Exercise per Week: 0 days    Minutes of Exercise per Session: 0 min  Stress: No Stress Concern Present (06/23/2023)   [employer] of Occupational Health - Occupational Stress Questionnaire    Feeling of Stress : Not at all  Social Connections: Socially Isolated (06/23/2023)   Social Connection and Isolation Panel    Frequency of Communication with Friends and Family: Three times a week    Frequency of Social Gatherings with Friends and Family: Once a week    Attends Religious Services: Never    Database administrator or  Organizations: No    Attends Banker Meetings: Never    Marital Status: Never married    Review of Systems Per HPI  Objective:  BP (!) 154/73   Pulse 77   Resp 18   Ht 5' 8 (1.727 m)   Wt 197 lb 1.3 oz (89.4 kg)   SpO2 95%   BMI 29.97 kg/m      11/22/2023   10:02 AM 11/19/2023    7:00 PM 11/19/2023    6:00 PM  BP/Weight  Systolic BP 154 174 165  Diastolic BP 73 74 74  Wt. (Lbs) 197.08    BMI 29.97 kg/m2      Physical Exam Vitals and nursing note reviewed.  Constitutional:      General: He is not in acute distress. HENT:     Head: Normocephalic and atraumatic.  Cardiovascular:     Rate and Rhythm: Normal rate and regular rhythm.  Pulmonary:     Effort:  Pulmonary effort is normal.     Breath sounds: Wheezing present.  Neurological:     Mental Status: He is alert.     Lab Results  Component Value Date   WBC 5.6 11/19/2023   HGB 11.0 (L) 11/19/2023   HCT 34.1 (L) 11/19/2023   PLT 308 11/19/2023   GLUCOSE 143 (H) 11/19/2023   CHOL 126 09/26/2022   TRIG 75 09/26/2022   HDL 59 09/26/2022   LDLCALC 52 09/26/2022   ALT 15 11/19/2023   AST 24 11/19/2023   NA 136 11/19/2023   K 3.6 11/19/2023   CL 97 (L) 11/19/2023   CREATININE 0.70 11/19/2023   BUN 7 (L) 11/19/2023   CO2 28 11/19/2023   HGBA1C 5.9 (H) 07/26/2021     Assessment & Plan:  COPD exacerbation (HCC) Assessment & Plan: Placing on antibiotic therapy.  Orders: -     Azithromycin ; Take 2 tablets on day 1, then 1 tablet daily on days 2 through 5  Dispense: 6 tablet; Refill: 0  Moderate recurrent major depression (HCC) Assessment & Plan: Recent worsening.  Increasing fluoxetine .  Orders: -     FLUoxetine  HCl; Take 1 capsule (20 mg total) by mouth daily.  Dispense: 90 capsule; Refill: 3    Follow-up:  Return if symptoms worsen or fail to improve.  Jacqulyn Ahle DO [facility]

## 2023-11-22 NOTE — Patient Instructions (Signed)
 Prozac  increased.  Antibiotic sent in.  Take care  Dr. Bluford

## 2023-11-27 DIAGNOSIS — R531 Weakness: Secondary | ICD-10-CM | POA: Diagnosis not present

## 2023-11-27 DIAGNOSIS — R208 Other disturbances of skin sensation: Secondary | ICD-10-CM | POA: Diagnosis not present

## 2023-11-27 DIAGNOSIS — Z743 Need for continuous supervision: Secondary | ICD-10-CM | POA: Diagnosis not present

## 2023-11-27 DIAGNOSIS — K219 Gastro-esophageal reflux disease without esophagitis: Secondary | ICD-10-CM | POA: Diagnosis not present

## 2023-11-27 DIAGNOSIS — Z79899 Other long term (current) drug therapy: Secondary | ICD-10-CM | POA: Diagnosis not present

## 2023-11-28 ENCOUNTER — Encounter (INDEPENDENT_AMBULATORY_CARE_PROVIDER_SITE_OTHER): Payer: Self-pay | Admitting: *Deleted

## 2023-11-29 ENCOUNTER — Other Ambulatory Visit: Payer: Self-pay | Admitting: Family Medicine

## 2023-11-29 DIAGNOSIS — J4489 Other specified chronic obstructive pulmonary disease: Secondary | ICD-10-CM

## 2023-12-04 ENCOUNTER — Ambulatory Visit: Payer: Self-pay | Admitting: Family Medicine

## 2023-12-04 ENCOUNTER — Telehealth: Payer: Self-pay | Admitting: Gastroenterology

## 2023-12-04 ENCOUNTER — Encounter: Payer: Self-pay | Admitting: Family Medicine

## 2023-12-04 VITALS — BP 162/73 | HR 75 | Ht 68.0 in | Wt 195.0 lb

## 2023-12-04 DIAGNOSIS — J439 Emphysema, unspecified: Secondary | ICD-10-CM

## 2023-12-04 DIAGNOSIS — K219 Gastro-esophageal reflux disease without esophagitis: Secondary | ICD-10-CM

## 2023-12-04 DIAGNOSIS — J4489 Other specified chronic obstructive pulmonary disease: Secondary | ICD-10-CM | POA: Diagnosis not present

## 2023-12-04 DIAGNOSIS — Z8669 Personal history of other diseases of the nervous system and sense organs: Secondary | ICD-10-CM

## 2023-12-04 MED ORDER — BREZTRI AEROSPHERE 160-9-4.8 MCG/ACT IN AERO: 2.0000 | INHALATION_SPRAY | Freq: Two times a day (BID) | RESPIRATORY_TRACT | 11 refills | Status: DC

## 2023-12-04 NOTE — Assessment & Plan Note (Signed)
 Referring to Neurology. Perhaps he could get off or at least back of some of the medications.

## 2023-12-04 NOTE — Assessment & Plan Note (Signed)
 Uncontrolled. Spoke with GI. They are going to reach out to him for an appt.

## 2023-12-04 NOTE — Patient Instructions (Addendum)
 Referral placed to neurology.   I have reached out to GI.  Sending in Lamar.  Take care  Dr. Bluford

## 2023-12-04 NOTE — Assessment & Plan Note (Signed)
 Stopping Anoro. Starting Breztri.

## 2023-12-04 NOTE — Telephone Encounter (Signed)
 Reached by Dr. Bluford today to request to get him scheduled for follow-up given he is having refractory GERD symptoms.  Reached out to our front desk desk, Jhonny Croak and Devere Marcus to get him scheduled for a nonurgent follow-up with myself or Dr. Eartha in the near future.  Charmaine Melia, MSN, APRN, FNP-BC, AGACNP-BC [facility]

## 2023-12-04 NOTE — Progress Notes (Signed)
 Subjective:  Patient ID: Adrian Bird, male    DOB: 06-03-1955  Age: 68 y.o. MRN: [id number]  CC:   Chief Complaint  Patient presents with   Medical Management of Chronic Issues    Follow up, patient wants to be taken off medication     HPI:  68 year old male presents for evaluation of the above.  Patient states that he continues to have productive cough.  Has recently been treated for COPD exacerbation.    Also continues to have frequent GERD despite Protonix  BID. Needs follow up with GI.  Additionally, he reports that he feels that he is taking too much medication. He would like to discuss his medications today.  Patient Active Problem List   Diagnosis Date Noted   Insomnia 09/26/2022   Gastroesophageal reflux disease 11/03/2021   History of epilepsy 07/26/2021   OSA on CPAP 12/15/2020   Tobacco abuse 12/15/2020   COPD with chronic bronchitis and emphysema (HCC) 12/15/2020   Essential tremor 10/15/2020   Anxiety disorder 09/16/2019   Chronic low back pain 09/16/2019   Long-term current use of opiate analgesic 09/16/2019   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/16/2019   Essential hypertension 08/20/2014   Arthritis 10/04/2012   Mixed hyperlipidemia 10/04/2012    Social Hx   Social History   Socioeconomic History   Marital status: Married    Spouse name: Not on file   Number of children: Not on file   Years of education: Not on file   Highest education level: Not on file  Occupational History   Not on file  Tobacco Use   Smoking status: Every Day    Current packs/day: 0.25    Average packs/day: 0.3 packs/day for 50.0 years (12.5 ttl pk-yrs)    Types: E-cigarettes, Cigarettes   Smokeless tobacco: Never  Vaping Use   Vaping status: Some Days  Substance and Sexual Activity   Alcohol  use: No   Drug use: No   Sexual activity: Yes  Other Topics Concern   Not on file  Social History Narrative   Not on file   Social Drivers of Health    Financial Resource Strain: Low Risk  (06/23/2023)   Overall Financial Resource Strain (CARDIA)    Difficulty of Paying Living Expenses: Not hard at all  Food Insecurity: No Food Insecurity (06/23/2023)   Hunger Vital Sign    Worried About Running Out of Food in the Last Year: Never true    Ran Out of Food in the Last Year: Never true  Transportation Needs: No Transportation Needs (06/23/2023)   PRAPARE - Administrator, Civil Service (Medical): No    Lack of Transportation (Non-Medical): No  Physical Activity: Inactive (06/23/2023)   Exercise Vital Sign    Days of Exercise per Week: 0 days    Minutes of Exercise per Session: 0 min  Stress: No Stress Concern Present (06/23/2023)   [employer] of Occupational Health - Occupational Stress Questionnaire    Feeling of Stress : Not at all  Social Connections: Socially Isolated (06/23/2023)   Social Connection and Isolation Panel    Frequency of Communication with Friends and Family: Three times a week    Frequency of Social Gatherings with Friends and Family: Once a week    Attends Religious Services: Never    Database Administrator or Organizations: No    Attends Banker Meetings: Never    Marital Status: Never married    Review  of Systems Per HPI  Objective:  BP (!) 162/73   Pulse 75   Ht 5' 8 (1.727 m)   Wt 195 lb (88.5 kg)   SpO2 97%   BMI 29.65 kg/m      12/04/2023   11:05 AM 11/22/2023   10:02 AM 11/19/2023    7:00 PM  BP/Weight  Systolic BP 162 154 174  Diastolic BP 73 73 74  Wt. (Lbs) 195 197.08   BMI 29.65 kg/m2 29.97 kg/m2     Physical Exam Constitutional:      General: He is not in acute distress.    Appearance: Normal appearance.  HENT:     Head: Normocephalic and atraumatic.  Cardiovascular:     Rate and Rhythm: Normal rate and regular rhythm.  Pulmonary:     Effort: Pulmonary effort is normal.     Breath sounds: Normal breath sounds. No wheezing or rales.   Neurological:     Mental Status: He is alert.  Psychiatric:     Comments: Anxious.     Lab Results  Component Value Date   WBC 5.6 11/19/2023   HGB 11.0 (L) 11/19/2023   HCT 34.1 (L) 11/19/2023   PLT 308 11/19/2023   GLUCOSE 143 (H) 11/19/2023   CHOL 126 09/26/2022   TRIG 75 09/26/2022   HDL 59 09/26/2022   LDLCALC 52 09/26/2022   ALT 15 11/19/2023   AST 24 11/19/2023   NA 136 11/19/2023   K 3.6 11/19/2023   CL 97 (L) 11/19/2023   CREATININE 0.70 11/19/2023   BUN 7 (L) 11/19/2023   CO2 28 11/19/2023   HGBA1C 5.9 (H) 07/26/2021     Assessment & Plan:  COPD with chronic bronchitis and emphysema (HCC) Assessment & Plan: Stopping Anoro. Starting Breztri.  Orders: -     Breztri Aerosphere; Inhale 2 puffs into the lungs 2 (two) times daily.  Dispense: 10.7 g; Refill: 11  History of epilepsy Assessment & Plan: Referring to Neurology. Perhaps he could get off or at least back of some of the medications.  Orders: -     Ambulatory referral to Neurology  Gastroesophageal reflux disease, unspecified whether esophagitis present Assessment & Plan: Uncontrolled. Spoke with GI. They are going to reach out to him for an appt.     Follow-up:  Return in about 3 months (around 03/05/2024).  Jacqulyn Ahle DO [facility]

## 2023-12-06 ENCOUNTER — Telehealth: Payer: Self-pay

## 2023-12-06 NOTE — Telephone Encounter (Signed)
 Copied from CRM [id number]. Topic: Referral - Question >> Dec 06, 2023  9:09 AM Lonell PEDLAR wrote: Reason for CRM: Patient was referred to a neurologist, but he cannot commute to [place], he would like to go to a provider more local to home. Please call pt to best advise.

## 2023-12-11 ENCOUNTER — Other Ambulatory Visit: Payer: Self-pay | Admitting: Family Medicine

## 2023-12-14 ENCOUNTER — Encounter (INDEPENDENT_AMBULATORY_CARE_PROVIDER_SITE_OTHER): Payer: Self-pay | Admitting: Gastroenterology

## 2023-12-19 ENCOUNTER — Other Ambulatory Visit: Payer: Self-pay | Admitting: Family Medicine

## 2023-12-28 ENCOUNTER — Telehealth (INDEPENDENT_AMBULATORY_CARE_PROVIDER_SITE_OTHER): Payer: Self-pay

## 2023-12-28 ENCOUNTER — Ambulatory Visit (INDEPENDENT_AMBULATORY_CARE_PROVIDER_SITE_OTHER): Admitting: Gastroenterology

## 2023-12-28 ENCOUNTER — Encounter (INDEPENDENT_AMBULATORY_CARE_PROVIDER_SITE_OTHER): Payer: Self-pay | Admitting: Gastroenterology

## 2023-12-28 VITALS — BP 169/83 | HR 82 | Temp 98.2°F | Ht 66.5 in | Wt 196.5 lb

## 2023-12-28 DIAGNOSIS — R079 Chest pain, unspecified: Secondary | ICD-10-CM | POA: Diagnosis not present

## 2023-12-28 DIAGNOSIS — K219 Gastro-esophageal reflux disease without esophagitis: Secondary | ICD-10-CM

## 2023-12-28 MED ORDER — VOQUEZNA 10 MG PO TABS: 1.0000 | ORAL_TABLET | Freq: Every day | ORAL | 1 refills | Status: DC

## 2023-12-28 MED ORDER — VOQUEZNA 20 MG PO TABS: 1.0000 | ORAL_TABLET | Freq: Every day | ORAL | 2 refills | Status: DC

## 2023-12-28 MED ORDER — VOQUEZNA 20 MG PO TABS: 20.0000 mg | ORAL_TABLET | Freq: Every day | ORAL | Status: DC

## 2023-12-28 NOTE — Progress Notes (Signed)
**Note Adrian-Identified via Obfuscation**  Toribio Fortune, M.D. Gastroenterology & Hepatology [facility] [address] [place], [place] [address]  Primary Care Physician: Cook, Jayce G, DO [address] [place] [address]  I will communicate my assessment and recommendations to the referring MD via EMR.  Problems: Chest pain  History of Present Illness: Adrian Bird is a 68 y.o. male with a history of asthma, depression, epilepsy, HTN, GERD who presents for follow up of chest pain.  The patient was last seen on 03/02/2022 Carylon Melia). At that time, the patient was advised to take omeprazole  40 mg twice daily and finish Carafate  course.  Also advised to take Linzess  290 mcg daily for constipation and MiraLAX.  He was scheduled for EGD and colonoscopy.  However, he did not pursue this and held off on further testing.  We confirmed with pharmacy the patient is currently taking pantoprazole  40 mg twice daily.  Patient reports that he has been presenting pressure and burning sensation in his mid chest for the last month. States that it has been present despite taking his medications compliantly. He describes any dysphagia or odynophagia. He states that he has been taking his medications compliantly without improvement. The pain is located in his mid chest area and is described as a burning and pressure pain He feels that sometimes the pain may ease down if he moves. He regurgitated some acid yesterday night.   He has had some bloating in his abdomen.  Takes Miralax as needed for constipation but not all the time.  The patient denies having any nausea, vomiting, fever, chills, hematochezia, melena, hematemesis, diarrhea, jaundice, pruritus or weight loss.  EGD 01/27/2022: -Hill grade 2 gastroesophageal flap -Nonbleeding superficial gastric ulcer with clean ulcer base in the gastric antrum s/p biopsy (10 mm at largest dimension) -Normal duodenum -Advised to avoid  NSAIDs, tobacco cessation -Continue omeprazole  40 mg twice daily -Advised Carafate  1 g 4 times daily for 1 month -Repeat EGD in 3 months for surveillance.  Last Colonoscopy: Never  Past Medical History: Past Medical History:  Diagnosis Date   Acid reflux    Asthma    Depression    Epilepsy (HCC)    High blood pressure    Urticaria     Past Surgical History: Past Surgical History:  Procedure Laterality Date   BIOPSY  01/27/2022   Procedure: BIOPSY;  Surgeon: Fortune Flavors, Toribio, MD;  Location: [facility];  Service: Gastroenterology;;   CYST REMOVAL NECK     CYST REMOVAL TRUNK     ESOPHAGOGASTRODUODENOSCOPY (EGD) WITH PROPOFOL  N/A 01/27/2022   Procedure: ESOPHAGOGASTRODUODENOSCOPY (EGD) WITH PROPOFOL ;  Surgeon: Fortune Flavors Toribio, MD;  Location: [facility];  Service: Gastroenterology;  Laterality: N/A;  1:30 pm, pt knows to arrive at 8:00   EYE SURGERY Bilateral    HERNIA REPAIR     KNEE SURGERY Left    TONSILLECTOMY      Family History: Family History  Problem Relation Age of Onset   Congestive Heart Failure Mother    Lung cancer Father    Diabetes Sister    Diabetes Brother    Diabetes Maternal Aunt    Lung cancer Maternal Grandfather     Social History: Social History   Tobacco Use  Smoking Status Every Day   Current packs/day: 0.25   Average packs/day: 0.3 packs/day for 50.0 years (12.5 ttl pk-yrs)   Types: E-cigarettes, Cigarettes  Smokeless Tobacco Never   Social History   Substance and  Sexual Activity  Alcohol  Use No   Social History   Substance and Sexual Activity  Drug Use No    Allergies: Allergies  Allergen Reactions   Cephalexin Hives   Doxycycline Hives and Swelling   Penicillins Hives   Sulfa Antibiotics Hives and Rash    Medications: Current Outpatient Medications  Medication Sig Dispense Refill   albuterol  (VENTOLIN  HFA) 108 (90 Base) MCG/ACT inhaler Inhale 2 puffs into the lungs every 4 (four) hours as  needed for wheezing or shortness of breath. 1 each 3   atorvastatin  (LIPITOR) 80 MG tablet TAKE 1 TABLET BY MOUTH AT BEDTIME. 90 tablet 3   budesonide-glycopyrrolate-formoterol (BREZTRI  AEROSPHERE) 160-9-4.8 MCG/ACT AERO inhaler Inhale 2 puffs into the lungs 2 (two) times daily. 10.7 g 11   busPIRone  (BUSPAR ) 5 MG tablet take 1 tablet (5 MILLIGRAM total) by mouth 2 (two) times daily as needed. 180 tablet 0   carbamazepine  (TEGRETOL ) 200 MG tablet Take 1 tablet (200 mg total) by mouth 3 (three) times daily. 270 tablet 3   diltiazem  (CARDIZEM  CD) 120 MG 24 hr capsule Take 1 capsule (120 mg total) by mouth daily. 90 capsule 3   ezetimibe  (ZETIA ) 10 MG tablet TAKE 1 TABLET BY MOUTH AT BEDTIME. 90 tablet 3   FLUoxetine  (PROZAC ) 20 MG capsule Take 1 capsule (20 mg total) by mouth daily. 90 capsule 3   gabapentin  (NEURONTIN ) 300 MG capsule take 1 capsule by mouth twice daily. 180 capsule 3   glucose blood (ACCU-CHEK GUIDE TEST) test strip use to test/check blood sugar 3 times daily. 100 strip 6   levETIRAcetam  (KEPPRA ) 750 MG tablet Take (1) tablet twice daily. 180 tablet 3   linaclotide  (LINZESS ) 290 MCG CAPS capsule take 1 capsule (290 MICROGRAM total) by mouth every morning. 90 capsule 0   lisinopril  (ZESTRIL ) 20 MG tablet Take 1 tablet (20 mg total) by mouth every morning. 90 tablet 3   montelukast  (SINGULAIR ) 10 MG tablet take 1 tablet (10 milligram total) by mouth at bedtime. 30 tablet 0   Multiple Vitamin (MULTIVITAMIN WITH MINERALS) TABS tablet Take 1 tablet by mouth daily.     Oxycodone HCl 10 MG TABS Take 10 mg by mouth in the morning, at noon, in the evening, and at bedtime.     pantoprazole  (PROTONIX ) 40 MG tablet Take 1 tablet (40 mg total) by mouth 2 (two) times daily before a meal. 180 tablet 3   tamsulosin  (FLOMAX ) 0.4 MG CAPS capsule Take 1 capsule (0.4 mg total) by mouth daily. 90 capsule 3   traZODone  (DESYREL ) 50 MG tablet take 1 tablet (50 milligram total) by mouth at bedtime. 90  tablet 0   fluticasone  (FLONASE ) 50 MCG/ACT nasal spray Place 2 sprays into both nostrils daily. (Patient not taking: Reported on 12/28/2023) 16 g 0   No current facility-administered medications for this visit.    Review of Systems: GENERAL: negative for malaise, night sweats HEENT: No changes in hearing or vision, no nose bleeds or other nasal problems. NECK: Negative for lumps, goiter, pain and significant neck swelling RESPIRATORY: Negative for cough, wheezing CARDIOVASCULAR: Negative for chest pain, leg swelling, palpitations, orthopnea GI: SEE HPI MUSCULOSKELETAL: Negative for joint pain or swelling, back pain, and muscle pain. SKIN: Negative for lesions, rash PSYCH: Negative for sleep disturbance, mood disorder and recent psychosocial stressors. HEMATOLOGY Negative for prolonged bleeding, bruising easily, and swollen nodes. ENDOCRINE: Negative for cold or heat intolerance, polyuria, polydipsia and goiter. NEURO: negative for tremor, gait imbalance, syncope and  seizures. The remainder of the review of systems is noncontributory.   Physical Exam: BP (!) 169/83 (BP Location: Left Arm, Patient Position: Sitting, Cuff Size: Normal)   Pulse 82   Temp 98.2 F (36.8 C) (Temporal)   Ht 5' 6.5 (1.689 m)   Wt 196 lb 8 oz (89.1 kg)   BMI 31.24 kg/m  GENERAL: The patient is AO x3, in no acute distress. HEENT: Head is normocephalic and atraumatic. EOMI are intact. Mouth is well hydrated and without lesions. NECK: Supple. No masses LUNGS: Clear to auscultation. No presence of rhonchi/wheezing/rales. Adequate chest expansion HEART: RRR, normal s1 and s2. ABDOMEN: Soft, nontender, no guarding, no peritoneal signs, and nondistended. BS +. No masses. EXTREMITIES: Without any cyanosis, clubbing, rash, lesions or edema. NEUROLOGIC: AOx3, no focal motor deficit. SKIN: no jaundice, no rashes  Imaging/Labs: as above  I personally reviewed and interpreted the available labs, imaging and  endoscopic files.  Impression and Plan: Jaycen Vercher is a 68 y.o. male with a history of asthma, depression, epilepsy, HTN, GERD who presents for follow up of chest pain.  Patient has presented worsening persistent pain in the middle of his chest.  He is not presenting with typical anginal symptoms, and he has had negative troponin testing as outpatient.  Nevertheless, he has had very poor improvement of his symptoms while taking a PPI on a regular basis twice a day, which she is rather unusual for GERD alone.  Due to this, we discussed the importance of evaluating if he is having pain due to GERD indeed or not, for which we will proceed with EGD with Bravo placement.  We will start him on Voquezna  for now, as this is a more potent medication than PPI (he will need to stop pantoprazole  for now).  -Start Voquezna  20 mg for 56 days (some samples provided), then decrease to 10 mg qday -Stop pantoprazole  -Schedule EGD with Bravo placement ON Voquezna   All questions were answered.      Toribio Fortune, MD Gastroenterology and Hepatology [facility]

## 2023-12-28 NOTE — H&P (View-Only) (Signed)
**Note Adrian-Identified via Obfuscation**  Toribio Fortune, M.D. Gastroenterology & Hepatology [facility] [address] [place], [place] [address]  Primary Care Physician: Adrian Bird, Adrian G, DO [address] [place] [address]  I will communicate my assessment and recommendations to the referring MD via EMR.  Problems: Chest pain  History of Present Illness: Adrian Bird is a 68 y.o. male with a history of asthma, depression, epilepsy, HTN, GERD who presents for follow up of chest pain.  The patient was last seen on 03/02/2022 Adrian Bird). At that time, the patient was advised to take omeprazole  40 mg twice daily and finish Carafate  course.  Also advised to take Linzess  290 mcg daily for constipation and MiraLAX.  He was scheduled for EGD and colonoscopy.  However, he did not pursue this and held off on further testing.  We confirmed with pharmacy the patient is currently taking pantoprazole  40 mg twice daily.  Patient reports that he has been presenting pressure and burning sensation in his mid chest for the last month. States that it has been present despite taking his medications compliantly. He describes any dysphagia or odynophagia. He states that he has been taking his medications compliantly without improvement. The pain is located in his mid chest area and is described as a burning and pressure pain He feels that sometimes the pain may ease down if he moves. He regurgitated some acid yesterday night.   He has had some bloating in his abdomen.  Takes Miralax as needed for constipation but not all the time.  The patient denies having any nausea, vomiting, fever, chills, hematochezia, melena, hematemesis, diarrhea, jaundice, pruritus or weight loss.  EGD 01/27/2022: -Hill grade 2 gastroesophageal flap -Nonbleeding superficial gastric ulcer with clean ulcer base in the gastric antrum s/p biopsy (10 mm at largest dimension) -Normal duodenum -Advised to avoid  NSAIDs, tobacco cessation -Continue omeprazole  40 mg twice daily -Advised Carafate  1 Bird 4 times daily for 1 month -Repeat EGD in 3 months for surveillance.  Last Colonoscopy: Never  Past Medical History: Past Medical History:  Diagnosis Date   Acid reflux    Asthma    Depression    Epilepsy (HCC)    High blood pressure    Urticaria     Past Surgical History: Past Surgical History:  Procedure Laterality Date   BIOPSY  01/27/2022   Procedure: BIOPSY;  Surgeon: Fortune Flavors, Toribio, MD;  [facility];  Service: Gastroenterology;;   CYST REMOVAL NECK     CYST REMOVAL TRUNK     ESOPHAGOGASTRODUODENOSCOPY (EGD) WITH PROPOFOL  N/A 01/27/2022   Procedure: ESOPHAGOGASTRODUODENOSCOPY (EGD) WITH PROPOFOL ;  Surgeon: Fortune Flavors Toribio, MD;  Location: [facility];  Service: Gastroenterology;  Laterality: N/A;  1:30 pm, pt knows to arrive at 8:00   EYE SURGERY Bilateral    HERNIA REPAIR     KNEE SURGERY Left    TONSILLECTOMY      Family History: Family History  Problem Relation Age of Onset   Congestive Heart Failure Mother    Lung cancer Father    Diabetes Sister    Diabetes Brother    Diabetes Maternal Aunt    Lung cancer Maternal Grandfather     Social History: Social History   Tobacco Use  Smoking Status Every Day   Current packs/day: 0.25   Average packs/day: 0.3 packs/day for 50.0 years (12.5 ttl pk-yrs)   Types: E-cigarettes, Cigarettes  Smokeless Tobacco Never   Social History   Substance and  Sexual Activity  Alcohol  Use No   Social History   Substance and Sexual Activity  Drug Use No    Allergies: Allergies  Allergen Reactions   Cephalexin Hives   Doxycycline Hives and Swelling   Penicillins Hives   Sulfa Antibiotics Hives and Rash    Medications: Current Outpatient Medications  Medication Sig Dispense Refill   albuterol  (VENTOLIN  HFA) 108 (90 Base) MCG/ACT inhaler Inhale 2 puffs into the lungs every 4 (four) hours as  needed for wheezing or shortness of breath. 1 each 3   atorvastatin  (LIPITOR) 80 MG tablet TAKE 1 TABLET BY MOUTH AT BEDTIME. 90 tablet 3   budesonide-glycopyrrolate-formoterol (BREZTRI  AEROSPHERE) 160-9-4.8 MCG/ACT AERO inhaler Inhale 2 puffs into the lungs 2 (two) times daily. 10.7 Bird 11   busPIRone  (BUSPAR ) 5 MG tablet take 1 tablet (5 MILLIGRAM total) by mouth 2 (two) times daily as needed. 180 tablet 0   carbamazepine  (TEGRETOL ) 200 MG tablet Take 1 tablet (200 mg total) by mouth 3 (three) times daily. 270 tablet 3   diltiazem  (CARDIZEM  CD) 120 MG 24 hr capsule Take 1 capsule (120 mg total) by mouth daily. 90 capsule 3   ezetimibe  (ZETIA ) 10 MG tablet TAKE 1 TABLET BY MOUTH AT BEDTIME. 90 tablet 3   FLUoxetine  (PROZAC ) 20 MG capsule Take 1 capsule (20 mg total) by mouth daily. 90 capsule 3   gabapentin  (NEURONTIN ) 300 MG capsule take 1 capsule by mouth twice daily. 180 capsule 3   glucose blood (ACCU-CHEK GUIDE TEST) test strip use to test/check blood sugar 3 times daily. 100 strip 6   levETIRAcetam  (KEPPRA ) 750 MG tablet Take (1) tablet twice daily. 180 tablet 3   linaclotide  (LINZESS ) 290 MCG CAPS capsule take 1 capsule (290 MICROGRAM total) by mouth every morning. 90 capsule 0   lisinopril  (ZESTRIL ) 20 MG tablet Take 1 tablet (20 mg total) by mouth every morning. 90 tablet 3   montelukast  (SINGULAIR ) 10 MG tablet take 1 tablet (10 milligram total) by mouth at bedtime. 30 tablet 0   Multiple Vitamin (MULTIVITAMIN WITH MINERALS) TABS tablet Take 1 tablet by mouth daily.     Oxycodone HCl 10 MG TABS Take 10 mg by mouth in the morning, at noon, in the evening, and at bedtime.     pantoprazole  (PROTONIX ) 40 MG tablet Take 1 tablet (40 mg total) by mouth 2 (two) times daily before a meal. 180 tablet 3   tamsulosin  (FLOMAX ) 0.4 MG CAPS capsule Take 1 capsule (0.4 mg total) by mouth daily. 90 capsule 3   traZODone  (DESYREL ) 50 MG tablet take 1 tablet (50 milligram total) by mouth at bedtime. 90  tablet 0   fluticasone  (FLONASE ) 50 MCG/ACT nasal spray Place 2 sprays into both nostrils daily. (Patient not taking: Reported on 12/28/2023) 16 Bird 0   No current facility-administered medications for this visit.    Review of Systems: GENERAL: negative for malaise, night sweats HEENT: No changes in hearing or vision, no nose bleeds or other nasal problems. NECK: Negative for lumps, goiter, pain and significant neck swelling RESPIRATORY: Negative for cough, wheezing CARDIOVASCULAR: Negative for chest pain, leg swelling, palpitations, orthopnea GI: SEE HPI MUSCULOSKELETAL: Negative for joint pain or swelling, back pain, and muscle pain. SKIN: Negative for lesions, rash PSYCH: Negative for sleep disturbance, mood disorder and recent psychosocial stressors. HEMATOLOGY Negative for prolonged bleeding, bruising easily, and swollen nodes. ENDOCRINE: Negative for cold or heat intolerance, polyuria, polydipsia and goiter. NEURO: negative for tremor, gait imbalance, syncope and  seizures. The remainder of the review of systems is noncontributory.   Physical Exam: BP (!) 169/83 (BP Location: Left Arm, Patient Position: Sitting, Cuff Size: Normal)   Pulse 82   Temp 98.2 F (36.8 C) (Temporal)   Ht 5' 6.5 (1.689 m)   Wt 196 lb 8 oz (89.1 kg)   BMI 31.24 kg/m  GENERAL: The patient is AO x3, in no acute distress. HEENT: Head is normocephalic and atraumatic. EOMI are intact. Mouth is well hydrated and without lesions. NECK: Supple. No masses LUNGS: Clear to auscultation. No presence of rhonchi/wheezing/rales. Adequate chest expansion HEART: RRR, normal s1 and s2. ABDOMEN: Soft, nontender, no guarding, no peritoneal signs, and nondistended. BS +. No masses. EXTREMITIES: Without any cyanosis, clubbing, rash, lesions or edema. NEUROLOGIC: AOx3, no focal motor deficit. SKIN: no jaundice, no rashes  Imaging/Labs: as above  I personally reviewed and interpreted the available labs, imaging and  endoscopic files.  Impression and Plan: Adrian Bird is a 68 y.o. male with a history of asthma, depression, epilepsy, HTN, GERD who presents for follow up of chest pain.  Patient has presented worsening persistent pain in the middle of his chest.  He is not presenting with typical anginal symptoms, and he has had negative troponin testing as outpatient.  Nevertheless, he has had very poor improvement of his symptoms while taking a PPI on a regular basis twice a day, which she is rather unusual for GERD alone.  Due to this, we discussed the importance of evaluating if he is having pain due to GERD indeed or not, for which we will proceed with EGD with Bravo placement.  We will start him on Voquezna  for now, as this is a more potent medication than PPI (he will need to stop pantoprazole  for now).  -Start Voquezna  20 mg for 56 days (some samples provided), then decrease to 10 mg qday -Stop pantoprazole  -Schedule EGD with Bravo placement ON Voquezna   All questions were answered.      Toribio Fortune, MD Gastroenterology and Hepatology [facility]

## 2023-12-28 NOTE — Patient Instructions (Addendum)
 Start Voquezna  20 mg for 56 days (some samples provided), then decrease to 10 mg qday Stop pantoprazole  Schedule EGD with Bravo placement ON PCAB

## 2023-12-28 NOTE — Telephone Encounter (Signed)
 Spoke with patient in the office, scheduled EGD/BRAVO for 01/16/2024 at 12:45pm. Instructions given to patient.

## 2023-12-28 NOTE — Telephone Encounter (Signed)
 PA on [facility] for EGD/BRAVO : Notification or Prior Authorization is not required for the requested services You are not required to submit a notification/prior authorization based on the information provided.  Decision ID #: [id number]

## 2024-01-03 ENCOUNTER — Ambulatory Visit: Admitting: Family Medicine

## 2024-01-08 ENCOUNTER — Other Ambulatory Visit: Payer: Self-pay | Admitting: Family Medicine

## 2024-01-09 ENCOUNTER — Telehealth (INDEPENDENT_AMBULATORY_CARE_PROVIDER_SITE_OTHER): Payer: Self-pay

## 2024-01-09 DIAGNOSIS — K219 Gastro-esophageal reflux disease without esophagitis: Secondary | ICD-10-CM

## 2024-01-09 MED ORDER — VOQUEZNA 20 MG PO TABS: 20.0000 mg | ORAL_TABLET | Freq: Every day | ORAL | Status: DC

## 2024-01-09 NOTE — Telephone Encounter (Signed)
 [facility] has not reached out to the patient regarding his medication we sent to [facility] on 12/28/2023. I gave the patient the number to reach out to [facility] as we got confirmation they received this script.   Vonoprazan Fumarate  (VOQUEZNA ) 20 MG TABS 28 tablet 2 12/28/2023 --   Sig - Route: Take 1 each by mouth daily. - Oral   Sent to pharmacy as: Vonoprazan Fumarate  (VOQUEZNA ) 20 MG Tab   E-Prescribing Status: Receipt confirmed by pharmacy (12/28/2023  3:47 PM EST)     I gave them the number of [phone] and asked that the patient call [facility].   I did give samples of Voquezna  20 mg to take one per day # 20. They will pick up from the front office.

## 2024-01-12 ENCOUNTER — Encounter (HOSPITAL_COMMUNITY)
Admission: RE | Admit: 2024-01-12 | Discharge: 2024-01-12 | Disposition: A | Source: Ambulatory Visit | Attending: Gastroenterology

## 2024-01-12 NOTE — Pre-Procedure Instructions (Signed)
 Attempted pre-op phone call. Left VM for him to call us back.

## 2024-01-12 NOTE — Pre-Procedure Instructions (Signed)
Attempted pre-op phone call. Left Vm for him to call us back. 

## 2024-01-16 ENCOUNTER — Encounter (HOSPITAL_COMMUNITY)
Admission: RE | Disposition: A | Discharge status: Home / Self Care | Payer: Self-pay | Source: Home / Self Care | Attending: Gastroenterology

## 2024-01-16 ENCOUNTER — Encounter (HOSPITAL_COMMUNITY): Payer: Self-pay | Admitting: Gastroenterology

## 2024-01-16 ENCOUNTER — Ambulatory Visit (HOSPITAL_COMMUNITY): Admitting: Anesthesiology

## 2024-01-16 ENCOUNTER — Ambulatory Visit (HOSPITAL_COMMUNITY)
Admission: RE | Admit: 2024-01-16 | Discharge: 2024-01-16 | Disposition: A | Attending: Gastroenterology | Admitting: Gastroenterology

## 2024-01-16 ENCOUNTER — Encounter (HOSPITAL_COMMUNITY): Admitting: Anesthesiology

## 2024-01-16 DIAGNOSIS — R1310 Dysphagia, unspecified: Secondary | ICD-10-CM | POA: Diagnosis not present

## 2024-01-16 DIAGNOSIS — K449 Diaphragmatic hernia without obstruction or gangrene: Secondary | ICD-10-CM | POA: Diagnosis not present

## 2024-01-16 DIAGNOSIS — R0789 Other chest pain: Secondary | ICD-10-CM | POA: Diagnosis not present

## 2024-01-16 HISTORY — PX: SAVORY DILATION: SHX5439

## 2024-01-16 HISTORY — PX: ESOPHAGOGASTRODUODENOSCOPY: SHX5428

## 2024-01-16 HISTORY — PX: BRAVO PH STUDY: SHX5421

## 2024-01-16 LAB — GLUCOSE, CAPILLARY: Glucose-Capillary: 123 mg/dL — ABNORMAL HIGH (ref 70–99)

## 2024-01-16 SURGERY — EGD (ESOPHAGOGASTRODUODENOSCOPY)
Anesthesia: Monitor Anesthesia Care

## 2024-01-16 MED ORDER — PROPOFOL 500 MG/50ML IV EMUL
INTRAVENOUS | Status: DC | PRN
  Administered 2024-01-16: 125 ug/kg/min via INTRAVENOUS
  Administered 2024-01-16: 80 mg via INTRAVENOUS
  Administered 2024-01-16 (×2): 20 mg via INTRAVENOUS
  Administered 2024-01-16: 30 mg via INTRAVENOUS

## 2024-01-16 MED ORDER — LIDOCAINE 2% (20 MG/ML) 5 ML SYRINGE
INTRAMUSCULAR | Status: DC | PRN
  Administered 2024-01-16: 60 mg via INTRAVENOUS

## 2024-01-16 MED ORDER — LACTATED RINGERS IV SOLN: INTRAVENOUS | Status: DC | PRN

## 2024-01-16 MED ORDER — STERILE WATER FOR IRRIGATION IR SOLN
Status: DC | PRN
  Administered 2024-01-16: 50 mL

## 2024-01-16 MED ORDER — LACTATED RINGERS IV SOLN: INTRAVENOUS | Status: DC

## 2024-01-16 NOTE — Op Note (Signed)
 [facility] Patient Name: Adrian Bird Procedure Date: 01/16/2024 10:55 AM MRN: [id number] Date of Birth: 07-13-1955 Attending MD: Toribio Fortune , , [id number] CSN: [id number] Age: 68 Admit Type: Outpatient Procedure:                Upper GI endoscopy Indications:              Chest pain (non cardiac) Providers:                Toribio Fortune, Jon LABOR. Gerome RN, RN, Daphne Mulch Technician, Pensions Consultant Referring MD:              Medicines:                Monitored Anesthesia Care Complications:            No immediate complications. Estimated Blood Loss:     Estimated blood loss: none. Procedure:                Pre-Anesthesia Assessment:                           - Prior to the procedure, a History and Physical                            was performed, and patient medications, allergies                            and sensitivities were reviewed. The patient's                            tolerance of previous anesthesia was reviewed.                           - The risks and benefits of the procedure and the                            sedation options and risks were discussed with the                            patient. All questions were answered and informed                            consent was obtained.                           - ASA Grade Assessment: II - A patient with mild                            systemic disease.                           After obtaining informed consent, the endoscope was                            passed under direct vision. Throughout  the                            procedure, the patient's blood pressure, pulse, and                            oxygen saturations were monitored continuously. The                            Endoscope was introduced through the mouth, and                            advanced to the second part of duodenum. The upper                            GI endoscopy was accomplished without  difficulty.                            The patient tolerated the procedure well. Scope In: 11:36:39 AM Scope Out: 11:57:14 AM Total Procedure Duration: 0 hours 20 minutes 35 seconds  Findings:      Esophagogastric landmarks were identified: the Z-line was found at 40 cm       and the site of the diaphragmatic hiatus was found at 42 cm from the       incisors.      No endoscopic abnormality was evident in the esophagus to explain the       patient's complaint of dysphagia. It was decided, however, to proceed       with dilation of the entire esophagus. A guidewire was placed and the       scope was withdrawn. Dilation was performed with a Savary dilator with       no resistance at 18 mm. The dilation site was examined following       endoscope reinsertion and showed no change. Biopsies were obtained from       the proximal and distal esophagus with cold forceps for histology of       eosinophilic esophagitis. The BRAVO capsule with delivery system was       introduced through the mouth and advanced into the esophagus, such that       the BRAVO pH capsule was positioned 34 cm from the incisors, which was 6       cm proximal to the GE junction. The BRAVO pH capsule was then deployed       and attached to the esophageal mucosa. The delivery system was then       withdrawn. Endoscopy was utilized for probe placement and diagnostic       evaluation.      A 2 cm hiatal hernia was present.      The stomach was normal.      The examined duodenum was normal. Impression:               - Esophagogastric landmarks identified.                           - No endoscopic esophageal abnormality to explain  patient's dysphagia. Esophagus dilated. Biopsies                            were taken with a cold forceps for evaluation of                            eosinophilic esophagitis.                           - 2 cm hiatal hernia.                           - Normal stomach.                            - Normal examined duodenum.                           - The BRAVO pH capsule was deployed. Moderate Sedation:      Per Anesthesia Care Recommendation:           - Discharge patient to home (ambulatory).                           - Resume previous diet.                           - Await pathology results.                           - Continue present medications.                           - Follow instructions for Bravo testing and return                            equipment in 96 hours.                           - Perform CXR in one week. Procedure Code(s):        --- Professional ---                           [phone], Esophagogastroduodenoscopy, flexible,                            transoral; with insertion of guide wire followed by                            passage of dilator(s) through esophagus over guide                            wire                           [id number], 59, Esophagogastroduodenoscopy, flexible,  transoral; with biopsy, single or multiple Diagnosis Code(s):        --- Professional ---                           R13.10, Dysphagia, unspecified                           K44.9, Diaphragmatic hernia without obstruction or                            gangrene                           R07.89, Other chest pain CPT copyright 2022 American Medical Association. All rights reserved. The codes documented in this report are preliminary and upon coder review may  be revised to meet current compliance requirements. Toribio Fortune, MD Toribio Fortune,  01/16/2024 12:11:18 PM This report has been signed electronically. Number of Addenda: 0

## 2024-01-16 NOTE — Transfer of Care (Signed)
 Immediate Anesthesia Transfer of Care Note  Patient: Adrian Bird  Procedure(s) Performed: EGD (ESOPHAGOGASTRODUODENOSCOPY) PH MONITORING, ESOPHAGUS, WIRELESS  Patient Location: Short Stay  Anesthesia Type:MAC  Level of Consciousness: drowsy and patient cooperative  Airway & Oxygen Therapy: Patient Spontanous Breathing and Patient connected to nasal cannula oxygen  Post-op Assessment: Report given to RN and Post -op Vital signs reviewed and stable  Post vital signs: Reviewed and stable  Last Vitals:  Vitals Value Taken Time  BP 123/80 01/16/24 12:05  Temp 36.4 C 01/16/24 12:05  Pulse 78 01/16/24 12:05  Resp 21 01/16/24 12:05  SpO2 97 % 01/16/24 12:05    Last Pain:  Vitals:   01/16/24 1205  TempSrc: Oral  PainSc: 0-No pain      Patients Stated Pain Goal: 7 (01/16/24 1107)  Complications: No notable events documented.

## 2024-01-16 NOTE — Anesthesia Preprocedure Evaluation (Signed)
 Anesthesia Evaluation  Patient identified by MRN, date of birth, ID band Patient awake    Reviewed: Allergy & Precautions, H&P , NPO status , Patient's Chart, lab work & pertinent test results, reviewed documented beta blocker date and time   Airway Mallampati: II  TM Distance: >3 FB Neck ROM: full    Dental no notable dental hx.    Pulmonary neg pulmonary ROS, asthma , sleep apnea , COPD, Current Smoker   Pulmonary exam normal breath sounds clear to auscultation       Cardiovascular Exercise Tolerance: Good hypertension, negative cardio ROS  Rhythm:regular Rate:Normal     Neuro/Psych Seizures -,  PSYCHIATRIC DISORDERS Anxiety Depression    negative neurological ROS  negative psych ROS   GI/Hepatic negative GI ROS, Neg liver ROS,GERD  ,,  Endo/Other  negative endocrine ROS    Renal/GU negative Renal ROS  negative genitourinary   Musculoskeletal   Abdominal   Peds  Hematology negative hematology ROS (+)   Anesthesia Other Findings   Reproductive/Obstetrics negative OB ROS                              Anesthesia Physical Anesthesia Plan  ASA: 2  Anesthesia Plan: MAC   Post-op Pain Management:    Induction:   PONV Risk Score and Plan: Propofol  infusion  Airway Management Planned:   Additional Equipment:   Intra-op Plan:   Post-operative Plan:   Informed Consent: I have reviewed the patients History and Physical, chart, labs and discussed the procedure including the risks, benefits and alternatives for the proposed anesthesia with the patient or authorized representative who has indicated his/her understanding and acceptance.     Dental Advisory Given  Plan Discussed with: CRNA  Anesthesia Plan Comments:         Anesthesia Quick Evaluation

## 2024-01-16 NOTE — Anesthesia Procedure Notes (Signed)
 Date/Time: 01/16/2024 11:32 AM  Performed by: Para Jerelene CROME, CRNAOxygen Delivery Method: Nasal cannula Comments: OptiFlow Nasal Cannula.

## 2024-01-16 NOTE — Interval H&P Note (Signed)
 History and Physical Interval Note:  01/16/2024 11:14 AM  Adrian Bird  has presented today for surgery, with the diagnosis of chest pain.  The various methods of treatment have been discussed with the patient and family. After consideration of risks, benefits and other options for treatment, the patient has consented to  Procedure(s) with comments: EGD (ESOPHAGOGASTRODUODENOSCOPY) (N/A) - 12:45pm, ASA 1-2 PH MONITORING, ESOPHAGUS, WIRELESS (N/A) as a surgical intervention.  The patient's history has been reviewed, patient examined, no change in status, stable for surgery.  I have reviewed the patient's chart and labs.  Questions were answered to the patient's satisfaction.     [name]

## 2024-01-16 NOTE — Discharge Instructions (Signed)
 You are being discharged to home.  Resume your previous diet.  We are waiting for your pathology results.  Continue your present medications.  Follow instructions for Bravo testing and return equipment in 96 hours. Perform CXR in one week.

## 2024-01-17 ENCOUNTER — Ambulatory Visit (INDEPENDENT_AMBULATORY_CARE_PROVIDER_SITE_OTHER): Payer: Self-pay | Admitting: Gastroenterology

## 2024-01-17 LAB — SURGICAL PATHOLOGY

## 2024-01-18 ENCOUNTER — Encounter (HOSPITAL_COMMUNITY): Payer: Self-pay | Admitting: Gastroenterology

## 2024-01-18 NOTE — Progress Notes (Signed)
 Patient result letter mailed Patient's PCP is on EPIC

## 2024-01-19 ENCOUNTER — Ambulatory Visit: Payer: Self-pay

## 2024-01-19 NOTE — Anesthesia Postprocedure Evaluation (Signed)
 Anesthesia Post Note  Patient: Toretto Tingler  Procedure(s) Performed: EGD (ESOPHAGOGASTRODUODENOSCOPY) PH MONITORING, ESOPHAGUS, WIRELESS EGD, WITH DILATION USING SAVARY-GILLIARD DILATOR OVER GUIDEWIRE  Patient location during evaluation: Phase II Anesthesia Type: MAC Level of consciousness: awake Pain management: pain level controlled Vital Signs Assessment: post-procedure vital signs reviewed and stable Respiratory status: spontaneous breathing and respiratory function stable Cardiovascular status: blood pressure returned to baseline and stable Postop Assessment: no headache and no apparent nausea or vomiting Anesthetic complications: no Comments: Late entry   No notable events documented.   Last Vitals:  Vitals:   01/16/24 1107 01/16/24 1205  BP: (!) 149/76 123/80  Pulse: 75 78  Resp: 15 (!) 21  Temp: 36.8 C 36.4 C  SpO2: 97% 97%    Last Pain:  Vitals:   01/16/24 1205  TempSrc: Oral  PainSc: 0-No pain                 Yvonna JINNY Bosworth

## 2024-01-19 NOTE — Telephone Encounter (Addendum)
 FYI Only or Action Required?: Action required by provider: refused ED.  Patient was last seen in primary care on 12/04/2023 by Cook, Jayce G, DO.  Called Nurse Triage reporting Chest Pain and Shortness of Breath.  Symptoms began 2-3 days ago.  Interventions attempted: Nothing.  Symptoms are: unchanged.  Triage Disposition: Call EMS 911 Now  Patient/caregiver understands and will follow disposition?: No, refuses disposition                                  1. LOCATION: Where does it hurt?       Sternum, between breasts 2. RADIATION: Does the pain go anywhere else? (e.g., into neck, jaw, arms, back)     Denies 3. ONSET: When did the chest pain begin? (Minutes, hours or days)      2-3 days ago, since EGD procedure, present now 4. PATTERN: Does the pain come and go, or has it been constant since it started?  Does it get worse with exertion?      Comes and goes, worsens after eating 5. DURATION: How long does it last (e.g., seconds, minutes, hours)     Lasts for about an hour 6. SEVERITY: How bad is the pain?  (e.g., Scale 1-10; mild, moderate, or severe)     Fluctuates, describes pain as pressure 10. OTHER SYMPTOMS: Do you have any other symptoms? (e.g., dizziness, nausea, vomiting, sweating, fever, difficulty breathing, cough)    New onset of mild intermittent SOB, vomiting yesterday after eating Denies dizziness       Patient called to report chest pain and mild SOB since EGD procedure he had completed on 01/16/24. This RN advised ED via 911. Patient refused and stated he would try and take one of his heartburn pills for relief. Please advise.  Copied from CRM #[id number]. Topic: Clinical - Red Word Triage >> Jan 19, 2024 11:17 AM Adrian Bird wrote: Red Word that prompted transfer to Nurse Triage: Pressure on chest, pain, and shortness of breath  Reason for Disposition  [1] Chest pain lasts > 5 minutes AND [2] age > 80  Protocols  used: Chest Pain-A-AH

## 2024-01-19 NOTE — Telephone Encounter (Signed)
 This RN called [facility] to notify of ED refusal. This RN spoke to [place].

## 2024-01-20 ENCOUNTER — Other Ambulatory Visit: Payer: Self-pay

## 2024-01-20 ENCOUNTER — Emergency Department (HOSPITAL_COMMUNITY)
Admission: EM | Admit: 2024-01-20 | Discharge: 2024-01-20 | Disposition: A | Discharge status: Home / Self Care | Attending: Emergency Medicine | Admitting: Emergency Medicine

## 2024-01-20 ENCOUNTER — Encounter (HOSPITAL_COMMUNITY): Payer: Self-pay

## 2024-01-20 DIAGNOSIS — R42 Dizziness and giddiness: Secondary | ICD-10-CM

## 2024-01-20 LAB — COMPREHENSIVE METABOLIC PANEL WITH GFR
ALT: 17 U/L (ref 0–44)
AST: 20 U/L (ref 15–41)
Albumin: 4.4 g/dL (ref 3.5–5.0)
Alkaline Phosphatase: 95 U/L (ref 38–126)
Anion gap: 6 (ref 5–15)
BUN: 6 mg/dL — ABNORMAL LOW (ref 8–23)
CO2: 31 mmol/L (ref 22–32)
Calcium: 9 mg/dL (ref 8.9–10.3)
Chloride: 96 mmol/L — ABNORMAL LOW (ref 98–111)
Creatinine, Ser: 0.73 mg/dL (ref 0.61–1.24)
GFR, Estimated: >60 mL/min (ref >60)
Glucose, Bld: 87 mg/dL (ref 70–99)
Potassium: 3.7 mmol/L (ref 3.5–5.1)
Sodium: 133 mmol/L — ABNORMAL LOW (ref 135–145)
Total Bilirubin: 0.2 mg/dL (ref 0.0–1.2)
Total Protein: 6.5 g/dL (ref 6.5–8.1)

## 2024-01-20 LAB — CBC WITH DIFFERENTIAL/PLATELET
Abs Immature Granulocytes: 0.02 K/uL (ref 0.00–0.07)
Basophils Absolute: 0.1 K/uL (ref 0.0–0.1)
Basophils Relative: 1 %
Eosinophils Absolute: 0.1 K/uL (ref 0.0–0.5)
Eosinophils Relative: 2 %
HCT: 31.8 % — ABNORMAL LOW (ref 39.0–52.0)
Hemoglobin: 10 g/dL — ABNORMAL LOW (ref 13.0–17.0)
Immature Granulocytes: 0 %
Lymphocytes Relative: 22 %
Lymphs Abs: 1.3 K/uL (ref 0.7–4.0)
MCH: 24.7 pg — ABNORMAL LOW (ref 26.0–34.0)
MCHC: 31.4 g/dL (ref 30.0–36.0)
MCV: 78.5 fL — ABNORMAL LOW (ref 80.0–100.0)
Monocytes Absolute: 1.3 K/uL — ABNORMAL HIGH (ref 0.1–1.0)
Monocytes Relative: 22 %
Neutro Abs: 3 K/uL (ref 1.7–7.7)
Neutrophils Relative %: 53 %
Platelets: 393 K/uL (ref 150–400)
RBC: 4.05 MIL/uL — ABNORMAL LOW (ref 4.22–5.81)
RDW: 15.1 % (ref 11.5–15.5)
WBC: 5.7 K/uL (ref 4.0–10.5)
nRBC: 0 % (ref 0.0–0.2)

## 2024-01-20 LAB — CBG MONITORING, ED: Glucose-Capillary: 121 mg/dL — ABNORMAL HIGH (ref 70–99)

## 2024-01-20 MED ORDER — SODIUM CHLORIDE 0.9 % IV BOLUS
1000.0000 mL | Freq: Once | INTRAVENOUS | Status: AC
  Administered 2024-01-20: 1000 mL via INTRAVENOUS

## 2024-01-20 NOTE — ED Provider Notes (Signed)
 [place] EMERGENCY DEPARTMENT AT [facility] Provider Note   CSN: [id number] Arrival date & time: 01/20/24  1418     Patient presents with: Dizziness   Adrian Bird is a 68 y.o. male.  {Add pertinent medical, surgical, social history, OB history to YEP:[id number]} Patient states that ever since he had an endoscopy done on Tuesday he has felt weak standing.  He has not passed out.  He feels a little lightheaded.  Patient has a history of seizures and COPD.  Endoscopy showed a small hernia  The history is provided by the patient and medical records. No language interpreter was used.  Dizziness Quality:  Lightheadedness Severity:  Mild Onset quality:  Sudden Timing:  Constant Progression:  Waxing and waning Chronicity:  New Context: not when bending over   Relieved by:  Nothing Worsened by:  Nothing Associated symptoms: no chest pain, no diarrhea and no headaches        Prior to Admission medications  Medication Sig Start Date End Date Taking? Authorizing Provider  albuterol  (VENTOLIN  HFA) 108 (90 Base) MCG/ACT inhaler Inhale 2 puffs into the lungs every 4 (four) hours as needed for wheezing or shortness of breath. 11/19/23   Cleotilde Rogue, MD  atorvastatin  (LIPITOR) 80 MG tablet TAKE 1 TABLET BY MOUTH AT BEDTIME. 05/31/23   Cook, Jayce G, DO  budesonide-glycopyrrolate-formoterol (BREZTRI  AEROSPHERE) 160-9-4.8 MCG/ACT AERO inhaler Inhale 2 puffs into the lungs 2 (two) times daily. 12/04/23   Cook, Jayce G, DO  busPIRone  (BUSPAR ) 5 MG tablet take 1 tablet (5 MILLIGRAM total) by mouth 2 (two) times daily as needed. 12/12/23   Cook, Jayce G, DO  carbamazepine  (TEGRETOL ) 200 MG tablet Take 1 tablet (200 mg total) by mouth 3 (three) times daily. 05/31/23   Cook, Jayce G, DO  diltiazem  (CARDIZEM  CD) 120 MG 24 hr capsule Take 1 capsule (120 mg total) by mouth daily. 05/31/23   Cook, Jayce G, DO  ezetimibe  (ZETIA ) 10 MG tablet TAKE 1 TABLET BY MOUTH AT BEDTIME. 05/31/23    Cook, Jayce G, DO  FLUoxetine  (PROZAC ) 20 MG capsule Take 1 capsule (20 mg total) by mouth daily. 11/22/23   Cook, Jayce G, DO  fluticasone  (FLONASE ) 50 MCG/ACT nasal spray Place 2 sprays into both nostrils daily. 11/17/23   Leath-Warren, Etta PARAS, NP  gabapentin  (NEURONTIN ) 300 MG capsule take 1 capsule by mouth twice daily. 06/01/23   Cook, Jayce G, DO  glucose blood (ACCU-CHEK GUIDE TEST) test strip use to test/check blood sugar 3 times daily. 11/16/23   Cook, Jayce G, DO  levETIRAcetam  (KEPPRA ) 750 MG tablet Take (1) tablet twice daily. 05/31/23   Cook, Jayce G, DO  linaclotide  (LINZESS ) 290 MCG CAPS capsule take 1 capsule (290 MICROGRAM total) by mouth every morning. 12/19/23   Cook, Jayce G, DO  lisinopril  (ZESTRIL ) 20 MG tablet Take 1 tablet (20 mg total) by mouth every morning. 05/31/23   Cook, Jayce G, DO  montelukast  (SINGULAIR ) 10 MG tablet take 1 tablet (10 milligram total) by mouth at bedtime. 01/08/24   Cook, Jayce G, DO  Multiple Vitamin (MULTIVITAMIN WITH MINERALS) TABS tablet Take 1 tablet by mouth daily.    [provider]  Oxycodone HCl 10 MG TABS Take 10 mg by mouth in the morning, at noon, in the evening, and at bedtime.    [provider]  tamsulosin  (FLOMAX ) 0.4 MG CAPS capsule Take 1 capsule (0.4 mg total) by mouth daily. 06/01/23   Cook, Jayce G, DO  traZODone  (DESYREL ) 50 MG tablet take 1 tablet (50 milligram total) by mouth at bedtime. 01/08/24   Cook, Jayce G, DO  Vonoprazan Fumarate  (VOQUEZNA ) 10 MG TABS Take 1 each by mouth daily. Start after finishing 20 mg dosing 12/28/23   Castaneda Mayorga, Toribio, MD  Vonoprazan Fumarate  (VOQUEZNA ) 20 MG TABS Take 1 each by mouth daily. 12/28/23   Eartha Angelia Toribio, MD  Vonoprazan Fumarate  (VOQUEZNA ) 20 MG TABS Take 20 mg by mouth daily at 6 (six) AM. 12/28/23   Eartha Angelia, Toribio, MD  Vonoprazan Fumarate  (VOQUEZNA ) 20 MG TABS Take 20 mg by mouth daily at 6 (six) AM. 01/09/24   Eartha Angelia, Toribio,  MD    Allergies: Cephalexin, Doxycycline, Penicillins, and Sulfa antibiotics    Review of Systems  Constitutional:  Negative for appetite change and fatigue.  HENT:  Negative for congestion, ear discharge and sinus pressure.   Eyes:  Negative for discharge.  Respiratory:  Negative for cough.   Cardiovascular:  Negative for chest pain.  Gastrointestinal:  Negative for abdominal pain and diarrhea.  Genitourinary:  Negative for frequency and hematuria.  Musculoskeletal:  Negative for back pain.  Skin:  Negative for rash.  Neurological:  Positive for dizziness. Negative for seizures and headaches.  Psychiatric/Behavioral:  Negative for hallucinations.     Updated Vital Signs BP 138/81   Pulse 72   Temp 98 F (36.7 C) (Oral)   Resp 19   Ht 5' 6.5 (1.689 m)   Wt 87.8 kg   SpO2 92%   BMI 30.78 kg/m   Physical Exam Vitals and nursing note reviewed.  Constitutional:      Appearance: He is well-developed.  HENT:     Head: Normocephalic.     Right Ear: External ear normal.     Mouth/Throat:     Mouth: Mucous membranes are moist.  Eyes:     General: No scleral icterus.    Conjunctiva/sclera: Conjunctivae normal.  Neck:     Thyroid : No thyromegaly.  Cardiovascular:     Rate and Rhythm: Normal rate and regular rhythm.     Heart sounds: No murmur heard.    No friction rub. No gallop.  Pulmonary:     Breath sounds: No stridor. No wheezing or rales.  Chest:     Chest wall: No tenderness.  Abdominal:     General: There is no distension.     Tenderness: There is no abdominal tenderness. There is no rebound.  Musculoskeletal:        General: Normal range of motion.     Cervical back: Neck supple.  Lymphadenopathy:     Cervical: No cervical adenopathy.  Skin:    Findings: No erythema or rash.  Neurological:     Mental Status: He is alert and oriented to person, place, and time.     Motor: No abnormal muscle tone.     Coordination: Coordination normal.  Psychiatric:         Behavior: Behavior normal.     (all labs ordered are listed, but only abnormal results are displayed) Labs Reviewed  CBC WITH DIFFERENTIAL/PLATELET - Abnormal; Notable for the following components:      Result Value   RBC 4.05 (*)    Hemoglobin 10.0 (*)    HCT 31.8 (*)    MCV 78.5 (*)    MCH 24.7 (*)    Monocytes Absolute 1.3 (*)    All other components within normal limits  CBG MONITORING, ED - Abnormal; Notable for  the following components:   Glucose-Capillary 121 (*)    All other components within normal limits  COMPREHENSIVE METABOLIC PANEL WITH GFR    EKG: None  Radiology: No results found.  {Document cardiac monitor, telemetry assessment procedure when appropriate:32947} Procedures   Medications Ordered in the ED  sodium chloride  0.9 % bolus 1,000 mL (1,000 mLs Intravenous Bolus 01/20/24 1441)      {Click here for ABCD2, HEART and other calculators REFRESH Note before signing:1}                              Medical Decision Making Amount and/or Complexity of Data Reviewed Labs: ordered. ECG/medicine tests: ordered.   Patient with mild weakness after endoscopy  {Document critical care time when appropriate  Document review of labs and clinical decision tools ie CHADS2VASC2, etc  Document your independent review of radiology images and any outside records  Document your discussion with family members, caretakers and with consultants  Document social determinants of health affecting pt's care  Document your decision making why or why not admission, treatments were needed:32947:::1}   Final diagnoses:  None    ED Discharge Orders     None

## 2024-01-20 NOTE — ED Notes (Signed)
 Pt called stating increased mid sternal pressure. EKG captured and vs obtained. Pt's color WDL. Provider notified.

## 2024-01-20 NOTE — Discharge Instructions (Signed)
 Your testing showed no specific abnormalities, it does appear that you have a very mild anemia and this can be followed up by your family doctor, this means you have slightly low red blood cells, nothing that needs to be concerned about today unless you start seeing large amounts of blood in your stools in which case you should return to the ER.  Plenty of clear liquids  Thank you for allowing us  to treat you in the emergency department today.  After reviewing your examination and potential testing that was done it appears that you are safe to go home.  I would like for you to follow-up with your doctor within the next several days, have them obtain your records and follow-up with them to review all potential tests and results from your visit.  If you should develop severe or worsening symptoms return to the emergency department immediately

## 2024-01-20 NOTE — ED Provider Notes (Signed)
 Clinics are unremarkable with a blood pressure change of 135 to about 124 with standing, he is not tachycardic febrile hypoxic, his metabolic panel and CBC were reviewed showing no significant findings of abnormalities of concern, EKG unremarkable, blood pressure at this time is 159/84, appears stable for discharge     Cleotilde Rogue, MD 01/20/24 1649

## 2024-01-20 NOTE — ED Triage Notes (Addendum)
 Pt from home complains of dizziness, pt recently had an endoscopy done on Tuesday and states that he's been dizzy since then. Per ems pt bp 120/70, and pt was wobbly when they stood him up. HX of DM. Denies CP/SOB. Pt aaox4 and ambulatory w/o assistance at baseline.

## 2024-01-24 ENCOUNTER — Other Ambulatory Visit (INDEPENDENT_AMBULATORY_CARE_PROVIDER_SITE_OTHER): Payer: Self-pay | Admitting: Gastroenterology

## 2024-01-24 DIAGNOSIS — R0789 Other chest pain: Secondary | ICD-10-CM | POA: Diagnosis not present

## 2024-01-24 DIAGNOSIS — K219 Gastro-esophageal reflux disease without esophagitis: Secondary | ICD-10-CM

## 2024-01-24 MED ORDER — OMEPRAZOLE 40 MG PO CPDR: 40.0000 mg | DELAYED_RELEASE_CAPSULE | Freq: Two times a day (BID) | ORAL | 1 refills | Status: AC

## 2024-01-24 NOTE — Procedures (Signed)
 PATHOLOGY AND BRAVO REPORT:  Pathology: A. ESOPHAGUS, BIOPSY:       Esophageal squamous mucosa with no significant diagnostic  alteration.      No evidence of intraepithelial eosinophils or lymphocytes.   Bravo report:   Procedure Description  The study was performed with the BRAVO pH capsule telemetry system. A routine upper endoscopy was performed and then BRAVO wireless pH telemetry probe was placed under endoscopic guidance 6 cm proximal to the Z line. The patient was then discharged home and kept a symptom diary for 96HRs, afterwhich the wireless telemetry receiver and diary were brought back to the endoscopy unit. The information was then downloaded and analyzed.     Indications  Chest pain (non-cardiac)    Interpretations   This was a normal study performed ON acid suppressive therapy. There was physiological esophageal acid exposure on Day 1 (1.1%),  Day 2 (4.5%), Day 3 (2.5%) and on Day 4 (2.4%) . Total AET was not  elevated 2.1%  (AET>4.0%) The DeMeester Score was not elevated at 7.2 for all days combined. There was symptom correlation for heartburn - 1 isolated episode in 96 hours.  There was one episode of chest pain, without any symptom correlation. This suggest that patient symptoms are likely not due to acid mediated GERD.  Clinical correlation is recommended.     RECOMMENDATION: As there is no correlation with acid reflux, will recommend proceeding with esophageal manometry for further evaluation of chest pain.   How are, the patient endorses that his symptoms have significantly improved with Voquezna .  However, he reports that his insurance will not cover this medication anymore.  We discussed the possibility of switching to omeprazole  40 mg twice a day and stopping Voquezna .  Toribio Fortune, MD Gastroenterology and Hepatology [facility] Gastroenterology

## 2024-01-25 ENCOUNTER — Telehealth (INDEPENDENT_AMBULATORY_CARE_PROVIDER_SITE_OTHER): Payer: Self-pay | Admitting: Gastroenterology

## 2024-01-25 NOTE — Telephone Encounter (Signed)
 Pt daughter left voicemail that there was suppose to be a medicine called into [facility] but they did not have it. Omeprazole  sent to [facility] yesterday. Returned call to patient. Pt states the pharmacy is suppose to deliver it at 4 pm today. Advised pt that we sent Omeprazole  to the pharmacy yesterday.

## 2024-01-26 ENCOUNTER — Encounter: Payer: Self-pay | Admitting: Family Medicine

## 2024-01-26 ENCOUNTER — Inpatient Hospital Stay: Admitting: Family Medicine

## 2024-01-26 VITALS — BP 143/80 | HR 71 | Temp 98.1°F | Ht 66.5 in | Wt 194.0 lb

## 2024-01-26 DIAGNOSIS — R350 Frequency of micturition: Secondary | ICD-10-CM | POA: Diagnosis not present

## 2024-01-26 DIAGNOSIS — D509 Iron deficiency anemia, unspecified: Secondary | ICD-10-CM

## 2024-01-26 NOTE — Patient Instructions (Addendum)
 Lab today.  We will call with results.  You really need a colonoscopy.  Follow up in 1 month.

## 2024-01-27 LAB — IRON,TIBC AND FERRITIN PANEL
Ferritin: 11 ng/mL — ABNORMAL LOW (ref 30–400)
Iron Saturation: 5 % — CL (ref 15–55)
Iron: 24 ug/dL — ABNORMAL LOW (ref 38–169)
Total Iron Binding Capacity: 457 ug/dL — ABNORMAL HIGH (ref 250–450)
UIBC: 433 ug/dL — ABNORMAL HIGH (ref 111–343)

## 2024-01-27 LAB — URINALYSIS, ROUTINE W REFLEX MICROSCOPIC
Bilirubin, UA: NEGATIVE
Glucose, UA: NEGATIVE
Ketones, UA: NEGATIVE
Leukocytes,UA: NEGATIVE
Nitrite, UA: NEGATIVE
Protein,UA: NEGATIVE
RBC, UA: NEGATIVE
Specific Gravity, UA: 1.013 (ref 1.005–1.030)
Urobilinogen, Ur: 0.2 mg/dL (ref 0.2–1.0)
pH, UA: 7.5 (ref 5.0–7.5)

## 2024-01-28 ENCOUNTER — Other Ambulatory Visit: Payer: Self-pay | Admitting: Family Medicine

## 2024-01-28 ENCOUNTER — Ambulatory Visit: Payer: Self-pay | Admitting: Family Medicine

## 2024-01-28 MED ORDER — IRON (FERROUS SULFATE) 325 (65 FE) MG PO TABS: 325.0000 mg | ORAL_TABLET | ORAL | 1 refills | Status: AC

## 2024-01-29 DIAGNOSIS — D509 Iron deficiency anemia, unspecified: Secondary | ICD-10-CM | POA: Insufficient documentation

## 2024-01-29 DIAGNOSIS — R350 Frequency of micturition: Secondary | ICD-10-CM | POA: Insufficient documentation

## 2024-01-29 NOTE — Progress Notes (Signed)
 "  Subjective:  Patient ID: Adrian Bird, male    DOB: September 10, 1955  Age: 68 y.o. MRN: [id number]  CC:   Chief Complaint  Patient presents with   Hospitalization Follow-up    12/13  Dizziness - low RBC's C/o fatigue and frequent urination due to possible yellow pill , name unknown, endoscopy follow up     HPI:  68 year old male presents for evaluation of the above.  Patient recently seen in the ER for dizziness.  Hyponatremic at 133.  Interestingly, he was anemic with a hemoglobin of 10.  Anemia is microcytic.  Has never had a colonoscopy.    Patient also reports ongoing increased urination.  He states that he believes that this is due to one of his medications.  He is not on any diuretic therapy.  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   Essential hypertension 08/20/2014   Arthritis 10/04/2012   Mixed hyperlipidemia 10/04/2012    Social Hx   Social History   Socioeconomic History   Marital status: Widowed    Spouse name: Not on file   Number of children: Not on file   Years of education: Not on file   Highest education level: Not on file  Occupational History   Not on file  Tobacco Use   Smoking status: Every Day    Current packs/day: 0.25    Average packs/day: 0.3 packs/day for 50.0 years (12.5 ttl pk-yrs)    Types: E-cigarettes, Cigarettes   Smokeless tobacco: Never  Vaping Use   Vaping status: Some Days  Substance and Sexual Activity   Alcohol  use: No   Drug use:  No   Sexual activity: Yes  Other Topics Concern   Not on file  Social History Narrative   Not on file   Social Drivers of Health   Tobacco Use: High Risk (01/26/2024)   Patient History    Smoking Tobacco Use: Every Day    Smokeless Tobacco Use: Never    Passive Exposure: Not on file  Financial Resource Strain: Low Risk (06/23/2023)   Overall Financial Resource Strain (CARDIA)    Difficulty of Paying Living Expenses: Not hard at all  Food Insecurity: No Food Insecurity (06/23/2023)   Hunger Vital Sign    Worried About Running Out of Food in the Last Year: Never true    Ran Out of Food in the Last Year: Never true  Transportation Needs: No Transportation Needs (06/23/2023)   PRAPARE - Administrator, Civil Service (Medical): No    Lack of Transportation (Non-Medical): No  Physical Activity: Inactive (06/23/2023)   Exercise Vital Sign    Days of Exercise per Week: 0 days    Minutes of Exercise per Session: 0 min  Stress: No Stress Concern Present (06/23/2023)   [employer] of Occupational Health - Occupational Stress Questionnaire    Feeling of Stress : Not at all  Social Connections: Socially Isolated (06/23/2023)   Social Connection and Isolation Panel    Frequency of Communication  with Friends and Family: Three times a week    Frequency of Social Gatherings with Friends and Family: Once a week    Attends Religious Services: Never    Database Administrator or Organizations: No    Attends Banker Meetings: Never    Marital Status: Never married  Depression (PHQ2-9): High Risk (12/04/2023)   Depression (PHQ2-9)    PHQ-2 Score: 13  Alcohol  Screen: Low Risk (06/23/2023)   Alcohol  Screen    Last Alcohol  Screening Score (AUDIT): 0  Housing: Unknown (06/23/2023)   Housing Stability Vital Sign    Unable to Pay for Housing in the Last Year: No    Number of Times Moved in the Last Year: Not on file    Homeless in the Last Year: No  Utilities: Not At Risk  (06/23/2023)   AHC Utilities    Threatened with loss of utilities: No  Health Literacy: Inadequate Health Literacy (06/23/2023)   B1300 Health Literacy    Frequency of need for help with medical instructions: Sometimes    Review of Systems Per HPI  Objective:  BP (!) 143/80   Pulse 71   Temp 98.1 F (36.7 C)   Ht 5' 6.5 (1.689 m)   Wt 194 lb (88 kg)   SpO2 97%   BMI 30.84 kg/m      01/26/2024   10:29 AM 01/20/2024    4:00 PM 01/20/2024    3:30 PM  BP/Weight  Systolic BP 143 159 149  Diastolic BP 80 84 76  Wt. (Lbs) 194    BMI 30.84 kg/m2      Physical Exam Vitals and nursing note reviewed.  Constitutional:      General: He is not in acute distress.    Appearance: Normal appearance.  HENT:     Head: Normocephalic and atraumatic.  Cardiovascular:     Rate and Rhythm: Normal rate and regular rhythm.  Pulmonary:     Effort: Pulmonary effort is normal.     Breath sounds: Normal breath sounds. No wheezing or rales.  Neurological:     Mental Status: He is alert.  Psychiatric:     Comments: Anxious.     Lab Results  Component Value Date   WBC 5.7 01/20/2024   HGB 10.0 (L) 01/20/2024   HCT 31.8 (L) 01/20/2024   PLT 393 01/20/2024   GLUCOSE 87 01/20/2024   CHOL 126 09/26/2022   TRIG 75 09/26/2022   HDL 59 09/26/2022   LDLCALC 52 09/26/2022   ALT 17 01/20/2024   AST 20 01/20/2024   NA 133 (L) 01/20/2024   K 3.7 01/20/2024   CL 96 (L) 01/20/2024   CREATININE 0.73 01/20/2024   BUN 6 (L) 01/20/2024   CO2 31 01/20/2024   HGBA1C 5.9 (H) 07/26/2021     Assessment & Plan:  Microcytic anemia Assessment & Plan: Labs revealed iron  deficiency.  Needs colonoscopy.  Placed referral.  Iron  sent in.  Orders: -     Iron , TIBC and Ferritin Panel  Urinary frequency Assessment & Plan: UA negative.  Supportive care.  Orders: -     Urinalysis, Routine w reflex microscopic    Follow-up:  1 month  Merdis Snodgrass DO [facility] "

## 2024-01-29 NOTE — Assessment & Plan Note (Signed)
 UA negative.  Supportive care.

## 2024-01-29 NOTE — Assessment & Plan Note (Signed)
 Labs revealed iron  deficiency.  Needs colonoscopy.  Placed referral.  Iron  sent in.

## 2024-02-01 ENCOUNTER — Other Ambulatory Visit: Payer: Self-pay

## 2024-02-01 ENCOUNTER — Emergency Department (HOSPITAL_COMMUNITY)

## 2024-02-01 ENCOUNTER — Encounter (HOSPITAL_COMMUNITY): Payer: Self-pay

## 2024-02-01 ENCOUNTER — Emergency Department (HOSPITAL_COMMUNITY): Admission: EM | Admit: 2024-02-01 | Discharge: 2024-02-01 | Disposition: A | Discharge status: Home / Self Care

## 2024-02-01 DIAGNOSIS — Z79899 Other long term (current) drug therapy: Secondary | ICD-10-CM | POA: Diagnosis not present

## 2024-02-01 DIAGNOSIS — Z7951 Long term (current) use of inhaled steroids: Secondary | ICD-10-CM | POA: Insufficient documentation

## 2024-02-01 DIAGNOSIS — R5383 Other fatigue: Secondary | ICD-10-CM | POA: Diagnosis not present

## 2024-02-01 DIAGNOSIS — J45909 Unspecified asthma, uncomplicated: Secondary | ICD-10-CM | POA: Diagnosis not present

## 2024-02-01 DIAGNOSIS — I10 Essential (primary) hypertension: Secondary | ICD-10-CM | POA: Insufficient documentation

## 2024-02-01 DIAGNOSIS — R519 Headache, unspecified: Secondary | ICD-10-CM | POA: Diagnosis present

## 2024-02-01 LAB — CBC WITH DIFFERENTIAL/PLATELET
Abs Immature Granulocytes: 0.01 K/uL (ref 0.00–0.07)
Basophils Absolute: 0.1 K/uL (ref 0.0–0.1)
Basophils Relative: 1 %
Eosinophils Absolute: 0.1 K/uL (ref 0.0–0.5)
Eosinophils Relative: 1 %
HCT: 38.1 % — ABNORMAL LOW (ref 39.0–52.0)
Hemoglobin: 12 g/dL — ABNORMAL LOW (ref 13.0–17.0)
Immature Granulocytes: 0 %
Lymphocytes Relative: 16 %
Lymphs Abs: 0.8 K/uL (ref 0.7–4.0)
MCH: 24.5 pg — ABNORMAL LOW (ref 26.0–34.0)
MCHC: 31.5 g/dL (ref 30.0–36.0)
MCV: 77.8 fL — ABNORMAL LOW (ref 80.0–100.0)
Monocytes Absolute: 0.9 K/uL (ref 0.1–1.0)
Monocytes Relative: 18 %
Neutro Abs: 3.2 K/uL (ref 1.7–7.7)
Neutrophils Relative %: 64 %
Platelets: 426 K/uL — ABNORMAL HIGH (ref 150–400)
RBC: 4.9 MIL/uL (ref 4.22–5.81)
RDW: 16 % — ABNORMAL HIGH (ref 11.5–15.5)
WBC: 4.9 K/uL (ref 4.0–10.5)
nRBC: 0 % (ref 0.0–0.2)

## 2024-02-01 LAB — BASIC METABOLIC PANEL WITH GFR
Anion gap: 15 (ref 5–15)
BUN: <5 mg/dL — ABNORMAL LOW (ref 8–23)
CO2: 24 mmol/L (ref 22–32)
Calcium: 9.3 mg/dL (ref 8.9–10.3)
Chloride: 97 mmol/L — ABNORMAL LOW (ref 98–111)
Creatinine, Ser: 0.56 mg/dL — ABNORMAL LOW (ref 0.61–1.24)
GFR, Estimated: >60 mL/min
Glucose, Bld: 99 mg/dL (ref 70–99)
Potassium: 3.9 mmol/L (ref 3.5–5.1)
Sodium: 136 mmol/L (ref 135–145)

## 2024-02-01 LAB — RESP PANEL BY RT-PCR (RSV, FLU A&B, COVID)  RVPGX2
Influenza A by PCR: NEGATIVE
Influenza B by PCR: NEGATIVE
Resp Syncytial Virus by PCR: NEGATIVE
SARS Coronavirus 2 by RT PCR: NEGATIVE

## 2024-02-01 MED ORDER — DIPHENHYDRAMINE HCL 50 MG/ML IJ SOLN
12.5000 mg | Freq: Once | INTRAMUSCULAR | Status: AC
  Administered 2024-02-01: 12.5 mg via INTRAVENOUS
  Filled 2024-02-01: qty 1

## 2024-02-01 MED ORDER — PROCHLORPERAZINE EDISYLATE 10 MG/2ML IJ SOLN
5.0000 mg | Freq: Once | INTRAMUSCULAR | Status: AC
  Administered 2024-02-01: 5 mg via INTRAVENOUS
  Filled 2024-02-01: qty 2

## 2024-02-01 MED ORDER — LACTATED RINGERS IV BOLUS
1000.0000 mL | Freq: Once | INTRAVENOUS | Status: AC
  Administered 2024-02-01: 1000 mL via INTRAVENOUS

## 2024-02-01 NOTE — ED Provider Notes (Signed)
 " [facility] EMERGENCY DEPARTMENT AT [facility] Provider Note   CSN: [id number] Arrival date & time: 02/01/24  9375     Patient presents with: Weakness   Adrian Bird is a 68 y.o. male.   68 year old male with past medical history of hypertension and hyperlipidemia presenting to the emergency department today with concern for fatigue and intermittent headaches that have been going on now for the past 4 months.  The patient thinks that it is due to the fluoxetine  that he is on.  He states that he seen his primary care doctor and is not had any results to explain his symptoms.  He came to the ER today due to ongoing symptoms.  He states that he does have a headache today.  He states he has been having intermittent headaches now over the past few months.  States that the headache is a frontal headache.  He states that this came on this morning and is gradually worsened.  He denies any focal weakness, numbness, or tingling.   Weakness      Prior to Admission medications  Medication Sig Start Date End Date Taking? Authorizing Provider  albuterol  (VENTOLIN  HFA) 108 (90 Base) MCG/ACT inhaler Inhale 2 puffs into the lungs every 4 (four) hours as needed for wheezing or shortness of breath. 11/19/23   Cleotilde Rogue, MD  atorvastatin  (LIPITOR) 80 MG tablet TAKE 1 TABLET BY MOUTH AT BEDTIME. 05/31/23   Cook, Jayce G, DO  budesonide-glycopyrrolate-formoterol (BREZTRI  AEROSPHERE) 160-9-4.8 MCG/ACT AERO inhaler Inhale 2 puffs into the lungs 2 (two) times daily. 12/04/23   Cook, Jayce G, DO  busPIRone  (BUSPAR ) 5 MG tablet take 1 tablet (5 MILLIGRAM total) by mouth 2 (two) times daily as needed. 12/12/23   Cook, Jayce G, DO  carbamazepine  (TEGRETOL ) 200 MG tablet Take 1 tablet (200 mg total) by mouth 3 (three) times daily. 05/31/23   Cook, Jayce G, DO  diltiazem  (CARDIZEM  CD) 120 MG 24 hr capsule Take 1 capsule (120 mg total) by mouth daily. 05/31/23   Cook, Jayce G, DO  ezetimibe  (ZETIA )  10 MG tablet TAKE 1 TABLET BY MOUTH AT BEDTIME. 05/31/23   Cook, Jayce G, DO  FLUoxetine  (PROZAC ) 20 MG capsule Take 1 capsule (20 mg total) by mouth daily. 11/22/23   Cook, Jayce G, DO  fluticasone  (FLONASE ) 50 MCG/ACT nasal spray Place 2 sprays into both nostrils daily. 11/17/23   Leath-Warren, Etta PARAS, NP  gabapentin  (NEURONTIN ) 300 MG capsule take 1 capsule by mouth twice daily. 06/01/23   Cook, Jayce G, DO  glucose blood (ACCU-CHEK GUIDE TEST) test strip use to test/check blood sugar 3 times daily. 11/16/23   Cook, Jayce G, DO  Iron , Ferrous Sulfate , 325 (65 Fe) MG TABS Take 325 mg by mouth every other day. 01/28/24   Cook, Jayce G, DO  levETIRAcetam  (KEPPRA ) 750 MG tablet Take (1) tablet twice daily. 05/31/23   Cook, Jayce G, DO  linaclotide  (LINZESS ) 290 MCG CAPS capsule take 1 capsule (290 MICROGRAM total) by mouth every morning. 12/19/23   Cook, Jayce G, DO  lisinopril  (ZESTRIL ) 20 MG tablet Take 1 tablet (20 mg total) by mouth every morning. 05/31/23   Cook, Jayce G, DO  montelukast  (SINGULAIR ) 10 MG tablet take 1 tablet (10 milligram total) by mouth at bedtime. 01/08/24   Cook, Jayce G, DO  Multiple Vitamin (MULTIVITAMIN WITH MINERALS) TABS tablet Take 1 tablet by mouth daily.    [provider]  omeprazole  (PRILOSEC) 40 MG capsule  Take 1 capsule (40 mg total) by mouth 2 (two) times daily. 01/24/24   Castaneda Mayorga, Daniel, MD  Oxycodone HCl 10 MG TABS Take 10 mg by mouth in the morning, at noon, in the evening, and at bedtime.    [provider]  tamsulosin  (FLOMAX ) 0.4 MG CAPS capsule Take 1 capsule (0.4 mg total) by mouth daily. 06/01/23   Cook, Jayce G, DO  traZODone  (DESYREL ) 50 MG tablet take 1 tablet (50 milligram total) by mouth at bedtime. 01/08/24   Cook, Jayce G, DO    Allergies: Cephalexin, Doxycycline, Penicillins, and Sulfa antibiotics    Review of Systems  Neurological:  Positive for weakness.  All other systems reviewed and are negative.   Updated  Vital Signs BP (!) 154/88   Pulse 71   Temp 97.8 F (36.6 C) (Oral)   Resp 16   Ht 5' 7 (1.702 m)   SpO2 96%   BMI 30.38 kg/m   Physical Exam Vitals and nursing note reviewed.   Gen: NAD Eyes: PERRL, EOMI HEENT: no oropharyngeal swelling Neck: trachea midline, no meningismus Resp: clear to auscultation bilaterally Card: RRR, no murmurs, rubs, or gallops Abd: nontender, nondistended Extremities: no calf tenderness, no edema Vascular: 2+ radial pulses bilaterally, 2+ DP pulses bilaterally Neuro: No focal deficits Skin: no rashes Psyc: acting appropriately   (all labs ordered are listed, but only abnormal results are displayed) Labs Reviewed  BASIC METABOLIC PANEL WITH GFR - Abnormal; Notable for the following components:      Result Value   Chloride 97 (*)    BUN <5 (*)    Creatinine, Ser 0.56 (*)    All other components within normal limits  CBC WITH DIFFERENTIAL/PLATELET - Abnormal; Notable for the following components:   Hemoglobin 12.0 (*)    HCT 38.1 (*)    MCV 77.8 (*)    MCH 24.5 (*)    RDW 16.0 (*)    Platelets 426 (*)    All other components within normal limits  RESP PANEL BY RT-PCR (RSV, FLU A&B, COVID)  [id number]    EKG: EKG Interpretation Date/Time:  Thursday February 01 2024 06:42:48 EST Ventricular Rate:  72 PR Interval:  213 QRS Duration:  94 QT Interval:  389 QTC Calculation: 426 R Axis:   59  Text Interpretation: Sinus rhythm Borderline prolonged PR interval Abnormal R-wave progression, early transition Confirmed by Ula Barter [phone]) on 02/01/2024 7:54:27 AM  Radiology: CT Head Wo Contrast Result Date: 02/01/2024 EXAM: CT HEAD WITHOUT CONTRAST 02/01/2024 08:04:57 AM TECHNIQUE: CT of the head was performed without the administration of intravenous contrast. Automated exposure control, iterative reconstruction, and/or weight based adjustment of the mA/kV was utilized to reduce the radiation dose to as low as reasonably achievable.  COMPARISON: CT head 03/21/2018. CLINICAL HISTORY: 68 year old male with headache, increasing frequency or severity, and malaise. FINDINGS: BRAIN AND VENTRICLES: Volume is within normal limits for age. No acute hemorrhage. No evidence of acute infarct. No hydrocephalus. No extra-axial collection. No mass effect or midline shift. Gray white differentiation is stable and within normal limits for age. Mild calcified atherosclerosis at the skull base. No suspicious intracranial vascular hyperdensity. ORBITS: No acute abnormality. SINUSES: Chronic left sphenoid sinus disease is stable since 2020. Other paranasal sinuses, tympanic cavities and mastoids are well aerated. SOFT TISSUES AND SKULL: No acute soft tissue abnormality. No skull fracture. IMPRESSION: 1. Normal for age non-contrast CT appearance of the brain. Electronically signed by: Helayne Hurst MD 02/01/2024 08:29 AM EST  RP Workstation: [id number]     Procedures   Medications Ordered in the ED  prochlorperazine  (COMPAZINE ) injection 5 mg (5 mg Intravenous Given 02/01/24 0813)  diphenhydrAMINE  (BENADRYL ) injection 12.5 mg (12.5 mg Intravenous Given 02/01/24 0813)  lactated ringers  bolus 1,000 mL (0 mLs Intravenous Stopped 02/01/24 0908)                                    Medical Decision Making 68 year old male with past medical history of hypertension, hyperlipidemia, asthma, and epilepsy presenting to the emergency department today with generalized weakness that has been going on now for the past 4 months.  I will further evaluate the patient here with basic labs to evaluate for anemia or electrolyte abnormalities.  Will also obtain a COVID and flu swab to see if this could be causing his acute worsening of symptoms.  Will also obtain a CT scan of his head given the headaches now over the past few months.  Symptoms not consistent with subarachnoid hemorrhage due to duration of symptoms and reassuring exam.  I will give patient Compazine  and  Benadryl  here and reevaluate for ultimate disposition.  The patient's labs are reassuring.  CT scan does not show any acute findings.  The patient remained stable and well-appearing here.  He is discharged with return precautions.  Amount and/or Complexity of Data Reviewed Labs: ordered. Radiology: ordered.  Risk Prescription drug management.        Final diagnoses:  Other fatigue    ED Discharge Orders     None          Ula Prentice SAUNDERS, MD 02/01/24 [phone]  "

## 2024-02-01 NOTE — Discharge Instructions (Signed)
 Your workup today was reassuring.  You may want to call your doctor to see if they would like to change her medications as this might be the cause for your symptoms.  Please follow-up and return to the ER for worsening symptoms.

## 2024-02-01 NOTE — ED Triage Notes (Signed)
 Pt BIB [facility] c/o of not feeling well that he feels it in his bones. He states he it just be in his head. Patient states he feels weak and has been to his PCP and they haven't found anything wrong with him. Patient ambulatory from EMS stretcher to ED stretcher.

## 2024-02-01 NOTE — ED Notes (Signed)
 Pt gone to CT

## 2024-02-29 ENCOUNTER — Telehealth: Payer: Self-pay

## 2024-02-29 NOTE — Telephone Encounter (Signed)
 Copied from CRM #[id number]. Topic: Clinical - Order For Equipment >> Feb 28, 2024 12:21 PM Harlene ORN wrote: Reason for CRM: Patient is requesting more equipment for his CPAP machine.  Please call back the patient to discuss further.

## 2024-03-04 ENCOUNTER — Other Ambulatory Visit: Payer: Self-pay | Admitting: Family Medicine

## 2024-03-04 DIAGNOSIS — J4489 Other specified chronic obstructive pulmonary disease: Secondary | ICD-10-CM

## 2024-03-05 ENCOUNTER — Ambulatory Visit: Admitting: Family Medicine

## 2024-03-06 ENCOUNTER — Other Ambulatory Visit: Payer: Self-pay | Admitting: Family Medicine

## 2024-03-06 DIAGNOSIS — J439 Emphysema, unspecified: Secondary | ICD-10-CM

## 2024-03-07 ENCOUNTER — Other Ambulatory Visit: Payer: Self-pay

## 2024-03-08 ENCOUNTER — Other Ambulatory Visit: Payer: Self-pay

## 2024-03-08 NOTE — Telephone Encounter (Signed)
 Spoke with patient he has been able to get his prepackaged medications picked up from the pharmacy, is still needing a refill for anoro ellipta  inhaler , pt states he used this every day and the albuterol  inhaler as needed.

## 2024-03-09 ENCOUNTER — Other Ambulatory Visit: Payer: Self-pay | Admitting: Family Medicine

## 2024-03-09 MED ORDER — UMECLIDINIUM-VILANTEROL 62.5-25 MCG/ACT IN AEPB: 1.0000 | INHALATION_SPRAY | Freq: Every day | RESPIRATORY_TRACT | 3 refills | Status: AC

## 2024-03-11 ENCOUNTER — Ambulatory Visit: Admitting: Family Medicine

## 2024-04-01 ENCOUNTER — Ambulatory Visit: Admitting: Neurology

## 2024-06-28 ENCOUNTER — Ambulatory Visit
